# Patient Record
Sex: Female | Born: 1935 | Race: White | Hispanic: No | Smoking: Former smoker
Health system: Southern US, Community
[De-identification: ages and names within clinical notes are randomized; demographics above are authoritative.]

## PROBLEM LIST (undated history)

## (undated) DIAGNOSIS — E119 Type 2 diabetes mellitus without complications: Secondary | ICD-10-CM

## (undated) DIAGNOSIS — I639 Cerebral infarction, unspecified: Secondary | ICD-10-CM

## (undated) DIAGNOSIS — K922 Gastrointestinal hemorrhage, unspecified: Secondary | ICD-10-CM

## (undated) DIAGNOSIS — I10 Essential (primary) hypertension: Secondary | ICD-10-CM

## (undated) DIAGNOSIS — I6523 Occlusion and stenosis of bilateral carotid arteries: Secondary | ICD-10-CM

## (undated) HISTORY — DX: Occlusion and stenosis of bilateral carotid arteries: I65.23

## (undated) HISTORY — DX: Cerebral infarction, unspecified: I63.9

## (undated) HISTORY — DX: Gastrointestinal hemorrhage, unspecified: K92.2

---

## 1998-06-01 ENCOUNTER — Encounter: Admission: RE | Admit: 1998-06-01 | Discharge: 1998-06-01 | Payer: Self-pay | Admitting: Family Medicine

## 1998-06-14 ENCOUNTER — Encounter: Admission: RE | Admit: 1998-06-14 | Discharge: 1998-06-14 | Payer: Self-pay | Admitting: Family Medicine

## 1998-06-30 ENCOUNTER — Encounter: Admission: RE | Admit: 1998-06-30 | Discharge: 1998-06-30 | Payer: Self-pay | Admitting: Family Medicine

## 1998-07-04 ENCOUNTER — Encounter: Admission: RE | Admit: 1998-07-04 | Discharge: 1998-07-04 | Payer: Self-pay | Admitting: Family Medicine

## 1998-07-04 ENCOUNTER — Inpatient Hospital Stay (HOSPITAL_COMMUNITY): Admission: RE | Admit: 1998-07-04 | Discharge: 1998-07-06 | Payer: Self-pay | Admitting: Family Medicine

## 1998-07-10 ENCOUNTER — Inpatient Hospital Stay (HOSPITAL_COMMUNITY): Admission: AD | Admit: 1998-07-10 | Discharge: 1998-07-12 | Payer: Self-pay | Admitting: Family Medicine

## 1998-07-13 ENCOUNTER — Encounter: Admission: RE | Admit: 1998-07-13 | Discharge: 1998-07-13 | Payer: Self-pay | Admitting: Family Medicine

## 1998-07-15 ENCOUNTER — Encounter: Admission: RE | Admit: 1998-07-15 | Discharge: 1998-10-13 | Payer: Self-pay | Admitting: Family Medicine

## 1998-07-19 ENCOUNTER — Encounter: Admission: RE | Admit: 1998-07-19 | Discharge: 1998-07-19 | Payer: Self-pay | Admitting: Family Medicine

## 1998-07-26 ENCOUNTER — Encounter: Admission: RE | Admit: 1998-07-26 | Discharge: 1998-07-26 | Payer: Self-pay | Admitting: Sports Medicine

## 1998-08-09 ENCOUNTER — Encounter: Admission: RE | Admit: 1998-08-09 | Discharge: 1998-08-09 | Payer: Self-pay | Admitting: Family Medicine

## 1998-08-24 ENCOUNTER — Encounter: Admission: RE | Admit: 1998-08-24 | Discharge: 1998-08-24 | Payer: Self-pay | Admitting: Family Medicine

## 1998-09-28 ENCOUNTER — Encounter: Admission: RE | Admit: 1998-09-28 | Discharge: 1998-09-28 | Payer: Self-pay | Admitting: Family Medicine

## 1998-10-11 ENCOUNTER — Encounter: Admission: RE | Admit: 1998-10-11 | Discharge: 1998-10-11 | Payer: Self-pay | Admitting: Family Medicine

## 1998-10-19 ENCOUNTER — Encounter: Admission: RE | Admit: 1998-10-19 | Discharge: 1998-10-19 | Payer: Self-pay | Admitting: Family Medicine

## 1998-10-31 ENCOUNTER — Encounter: Admission: RE | Admit: 1998-10-31 | Discharge: 1998-10-31 | Payer: Self-pay | Admitting: Family Medicine

## 1998-11-01 ENCOUNTER — Ambulatory Visit (HOSPITAL_COMMUNITY): Admission: RE | Admit: 1998-11-01 | Discharge: 1998-11-01 | Payer: Self-pay | Admitting: Family Medicine

## 1998-11-01 ENCOUNTER — Encounter: Payer: Self-pay | Admitting: Family Medicine

## 1998-11-02 ENCOUNTER — Encounter: Admission: RE | Admit: 1998-11-02 | Discharge: 1998-11-02 | Payer: Self-pay | Admitting: Family Medicine

## 1998-11-09 ENCOUNTER — Ambulatory Visit (HOSPITAL_COMMUNITY): Admission: RE | Admit: 1998-11-09 | Discharge: 1998-11-09 | Payer: Self-pay | Admitting: Family Medicine

## 1998-11-09 ENCOUNTER — Encounter: Admission: RE | Admit: 1998-11-09 | Discharge: 1998-11-09 | Payer: Self-pay | Admitting: Family Medicine

## 1998-11-23 ENCOUNTER — Encounter: Admission: RE | Admit: 1998-11-23 | Discharge: 1998-11-23 | Payer: Self-pay | Admitting: Family Medicine

## 1998-11-29 ENCOUNTER — Encounter: Admission: RE | Admit: 1998-11-29 | Discharge: 1998-11-29 | Payer: Self-pay | Admitting: Sports Medicine

## 1998-11-30 ENCOUNTER — Encounter: Admission: RE | Admit: 1998-11-30 | Discharge: 1998-11-30 | Payer: Self-pay | Admitting: Sports Medicine

## 1998-12-20 ENCOUNTER — Encounter: Admission: RE | Admit: 1998-12-20 | Discharge: 1998-12-20 | Payer: Self-pay | Admitting: Sports Medicine

## 1999-02-28 ENCOUNTER — Encounter: Admission: RE | Admit: 1999-02-28 | Discharge: 1999-02-28 | Payer: Self-pay | Admitting: Family Medicine

## 1999-03-11 HISTORY — PX: CORONARY ARTERY BYPASS GRAFT: SHX141

## 1999-03-14 ENCOUNTER — Inpatient Hospital Stay (HOSPITAL_COMMUNITY): Admission: AD | Admit: 1999-03-14 | Discharge: 1999-03-17 | Payer: Self-pay | Admitting: Sports Medicine

## 1999-03-14 ENCOUNTER — Encounter: Admission: RE | Admit: 1999-03-14 | Discharge: 1999-03-14 | Payer: Self-pay | Admitting: Family Medicine

## 1999-03-14 ENCOUNTER — Encounter: Payer: Self-pay | Admitting: Sports Medicine

## 1999-03-16 ENCOUNTER — Encounter: Payer: Self-pay | Admitting: Sports Medicine

## 1999-03-21 ENCOUNTER — Inpatient Hospital Stay (HOSPITAL_COMMUNITY): Admission: AD | Admit: 1999-03-21 | Discharge: 1999-03-31 | Payer: Self-pay | Admitting: Cardiology

## 1999-03-21 ENCOUNTER — Encounter: Payer: Self-pay | Admitting: Thoracic Surgery (Cardiothoracic Vascular Surgery)

## 1999-03-23 ENCOUNTER — Encounter: Payer: Self-pay | Admitting: Thoracic Surgery (Cardiothoracic Vascular Surgery)

## 1999-03-24 ENCOUNTER — Encounter: Payer: Self-pay | Admitting: Thoracic Surgery (Cardiothoracic Vascular Surgery)

## 1999-03-25 ENCOUNTER — Encounter: Payer: Self-pay | Admitting: Thoracic Surgery (Cardiothoracic Vascular Surgery)

## 1999-03-26 ENCOUNTER — Encounter: Payer: Self-pay | Admitting: Thoracic Surgery (Cardiothoracic Vascular Surgery)

## 1999-03-27 ENCOUNTER — Encounter: Payer: Self-pay | Admitting: Thoracic Surgery (Cardiothoracic Vascular Surgery)

## 1999-04-13 ENCOUNTER — Encounter: Payer: Self-pay | Admitting: Cardiology

## 1999-04-13 ENCOUNTER — Inpatient Hospital Stay (HOSPITAL_COMMUNITY): Admission: EM | Admit: 1999-04-13 | Discharge: 1999-04-14 | Payer: Self-pay | Admitting: Emergency Medicine

## 1999-04-18 ENCOUNTER — Encounter: Admission: RE | Admit: 1999-04-18 | Discharge: 1999-04-18 | Payer: Self-pay | Admitting: Family Medicine

## 1999-04-26 ENCOUNTER — Encounter: Admission: RE | Admit: 1999-04-26 | Discharge: 1999-04-26 | Payer: Self-pay | Admitting: Family Medicine

## 1999-05-03 ENCOUNTER — Encounter: Admission: RE | Admit: 1999-05-03 | Discharge: 1999-05-03 | Payer: Self-pay | Admitting: Family Medicine

## 1999-06-07 ENCOUNTER — Encounter: Admission: RE | Admit: 1999-06-07 | Discharge: 1999-06-07 | Payer: Self-pay | Admitting: Family Medicine

## 1999-08-04 ENCOUNTER — Encounter: Admission: RE | Admit: 1999-08-04 | Discharge: 1999-08-04 | Payer: Self-pay | Admitting: Family Medicine

## 1999-08-16 ENCOUNTER — Encounter: Admission: RE | Admit: 1999-08-16 | Discharge: 1999-08-16 | Payer: Self-pay | Admitting: Family Medicine

## 1999-09-12 ENCOUNTER — Encounter: Admission: RE | Admit: 1999-09-12 | Discharge: 1999-09-12 | Payer: Self-pay | Admitting: Family Medicine

## 1999-09-26 ENCOUNTER — Encounter: Admission: RE | Admit: 1999-09-26 | Discharge: 1999-09-26 | Payer: Self-pay | Admitting: Family Medicine

## 1999-10-17 ENCOUNTER — Encounter: Admission: RE | Admit: 1999-10-17 | Discharge: 1999-10-17 | Payer: Self-pay | Admitting: Sports Medicine

## 2000-01-16 ENCOUNTER — Encounter: Admission: RE | Admit: 2000-01-16 | Discharge: 2000-01-16 | Payer: Self-pay | Admitting: Family Medicine

## 2000-04-03 ENCOUNTER — Encounter: Admission: RE | Admit: 2000-04-03 | Discharge: 2000-04-03 | Payer: Self-pay | Admitting: Family Medicine

## 2000-04-17 ENCOUNTER — Encounter: Admission: RE | Admit: 2000-04-17 | Discharge: 2000-04-17 | Payer: Self-pay | Admitting: Sports Medicine

## 2000-04-17 ENCOUNTER — Encounter: Payer: Self-pay | Admitting: Sports Medicine

## 2000-07-30 ENCOUNTER — Encounter: Admission: RE | Admit: 2000-07-30 | Discharge: 2000-07-30 | Payer: Self-pay | Admitting: Sports Medicine

## 2000-08-21 ENCOUNTER — Encounter: Admission: RE | Admit: 2000-08-21 | Discharge: 2000-08-21 | Payer: Self-pay | Admitting: Family Medicine

## 2000-09-09 DIAGNOSIS — K922 Gastrointestinal hemorrhage, unspecified: Secondary | ICD-10-CM

## 2000-09-09 HISTORY — DX: Gastrointestinal hemorrhage, unspecified: K92.2

## 2000-09-19 ENCOUNTER — Inpatient Hospital Stay (HOSPITAL_COMMUNITY): Admission: EM | Admit: 2000-09-19 | Discharge: 2000-09-20 | Payer: Self-pay | Admitting: Emergency Medicine

## 2000-09-19 ENCOUNTER — Encounter: Admission: RE | Admit: 2000-09-19 | Discharge: 2000-09-19 | Payer: Self-pay | Admitting: Family Medicine

## 2000-10-08 ENCOUNTER — Encounter: Admission: RE | Admit: 2000-10-08 | Discharge: 2000-10-08 | Payer: Self-pay | Admitting: Family Medicine

## 2000-11-05 ENCOUNTER — Encounter: Admission: RE | Admit: 2000-11-05 | Discharge: 2000-11-05 | Payer: Self-pay | Admitting: Family Medicine

## 2001-03-12 ENCOUNTER — Encounter: Admission: RE | Admit: 2001-03-12 | Discharge: 2001-03-12 | Payer: Self-pay | Admitting: Family Medicine

## 2001-03-12 ENCOUNTER — Encounter: Payer: Self-pay | Admitting: Family Medicine

## 2001-03-12 ENCOUNTER — Inpatient Hospital Stay (HOSPITAL_COMMUNITY): Admission: AD | Admit: 2001-03-12 | Discharge: 2001-03-16 | Payer: Self-pay | Admitting: Family Medicine

## 2001-03-15 ENCOUNTER — Encounter: Payer: Self-pay | Admitting: Family Medicine

## 2001-03-17 ENCOUNTER — Encounter: Admission: RE | Admit: 2001-03-17 | Discharge: 2001-03-17 | Payer: Self-pay | Admitting: Family Medicine

## 2001-03-20 ENCOUNTER — Encounter: Admission: RE | Admit: 2001-03-20 | Discharge: 2001-03-20 | Payer: Self-pay | Admitting: Family Medicine

## 2001-03-21 ENCOUNTER — Encounter: Admission: RE | Admit: 2001-03-21 | Discharge: 2001-03-21 | Payer: Self-pay | Admitting: Family Medicine

## 2001-03-25 ENCOUNTER — Encounter: Admission: RE | Admit: 2001-03-25 | Discharge: 2001-03-25 | Payer: Self-pay | Admitting: Family Medicine

## 2001-04-09 ENCOUNTER — Encounter: Admission: RE | Admit: 2001-04-09 | Discharge: 2001-04-09 | Payer: Self-pay | Admitting: Family Medicine

## 2001-05-21 ENCOUNTER — Encounter: Admission: RE | Admit: 2001-05-21 | Discharge: 2001-05-21 | Payer: Self-pay | Admitting: Family Medicine

## 2001-06-18 ENCOUNTER — Encounter: Admission: RE | Admit: 2001-06-18 | Discharge: 2001-06-18 | Payer: Self-pay | Admitting: Sports Medicine

## 2001-07-18 ENCOUNTER — Encounter: Admission: RE | Admit: 2001-07-18 | Discharge: 2001-07-18 | Payer: Self-pay | Admitting: Family Medicine

## 2001-08-26 ENCOUNTER — Encounter: Payer: Self-pay | Admitting: Emergency Medicine

## 2001-08-26 ENCOUNTER — Emergency Department (HOSPITAL_COMMUNITY): Admission: EM | Admit: 2001-08-26 | Discharge: 2001-08-26 | Payer: Self-pay | Admitting: Emergency Medicine

## 2001-08-29 ENCOUNTER — Encounter: Admission: RE | Admit: 2001-08-29 | Discharge: 2001-08-29 | Payer: Self-pay | Admitting: Family Medicine

## 2001-09-19 ENCOUNTER — Encounter: Payer: Self-pay | Admitting: Family Medicine

## 2001-09-19 ENCOUNTER — Encounter: Admission: RE | Admit: 2001-09-19 | Discharge: 2001-09-19 | Payer: Self-pay | Admitting: Family Medicine

## 2001-11-04 ENCOUNTER — Encounter: Admission: RE | Admit: 2001-11-04 | Discharge: 2001-11-04 | Payer: Self-pay | Admitting: Family Medicine

## 2001-11-18 ENCOUNTER — Encounter: Admission: RE | Admit: 2001-11-18 | Discharge: 2001-11-18 | Payer: Self-pay | Admitting: Family Medicine

## 2001-12-10 ENCOUNTER — Encounter (INDEPENDENT_AMBULATORY_CARE_PROVIDER_SITE_OTHER): Payer: Self-pay | Admitting: *Deleted

## 2001-12-10 LAB — CONVERTED CEMR LAB

## 2001-12-12 ENCOUNTER — Encounter: Admission: RE | Admit: 2001-12-12 | Discharge: 2001-12-12 | Payer: Self-pay | Admitting: Family Medicine

## 2001-12-23 ENCOUNTER — Encounter: Admission: RE | Admit: 2001-12-23 | Discharge: 2001-12-23 | Payer: Self-pay | Admitting: Family Medicine

## 2002-01-06 ENCOUNTER — Encounter: Admission: RE | Admit: 2002-01-06 | Discharge: 2002-01-06 | Payer: Self-pay | Admitting: Family Medicine

## 2002-01-12 ENCOUNTER — Encounter: Payer: Self-pay | Admitting: Family Medicine

## 2002-01-12 ENCOUNTER — Encounter: Admission: RE | Admit: 2002-01-12 | Discharge: 2002-01-12 | Payer: Self-pay | Admitting: Family Medicine

## 2002-02-03 ENCOUNTER — Encounter: Admission: RE | Admit: 2002-02-03 | Discharge: 2002-02-03 | Payer: Self-pay | Admitting: Family Medicine

## 2002-03-13 ENCOUNTER — Encounter: Admission: RE | Admit: 2002-03-13 | Discharge: 2002-03-13 | Payer: Self-pay | Admitting: Family Medicine

## 2002-04-14 ENCOUNTER — Encounter: Admission: RE | Admit: 2002-04-14 | Discharge: 2002-04-14 | Payer: Self-pay | Admitting: Family Medicine

## 2002-05-12 ENCOUNTER — Encounter: Admission: RE | Admit: 2002-05-12 | Discharge: 2002-05-12 | Payer: Self-pay | Admitting: Family Medicine

## 2002-06-15 ENCOUNTER — Encounter: Admission: RE | Admit: 2002-06-15 | Discharge: 2002-06-15 | Payer: Self-pay | Admitting: Family Medicine

## 2002-07-10 DIAGNOSIS — I639 Cerebral infarction, unspecified: Secondary | ICD-10-CM

## 2002-07-10 HISTORY — PX: PANRETINAL PHOTOCOAGULATION: SHX2160

## 2002-07-10 HISTORY — DX: Cerebral infarction, unspecified: I63.9

## 2002-07-14 ENCOUNTER — Encounter: Admission: RE | Admit: 2002-07-14 | Discharge: 2002-07-14 | Payer: Self-pay | Admitting: Family Medicine

## 2002-08-06 ENCOUNTER — Emergency Department (HOSPITAL_COMMUNITY): Admission: EM | Admit: 2002-08-06 | Discharge: 2002-08-06 | Payer: Self-pay | Admitting: Emergency Medicine

## 2002-08-06 ENCOUNTER — Encounter: Payer: Self-pay | Admitting: Emergency Medicine

## 2002-08-07 ENCOUNTER — Ambulatory Visit: Admission: RE | Admit: 2002-08-07 | Discharge: 2002-08-07 | Payer: Self-pay | Admitting: Family Medicine

## 2002-08-11 ENCOUNTER — Encounter: Admission: RE | Admit: 2002-08-11 | Discharge: 2002-08-11 | Payer: Self-pay | Admitting: Family Medicine

## 2002-08-25 ENCOUNTER — Encounter: Admission: RE | Admit: 2002-08-25 | Discharge: 2002-08-25 | Payer: Self-pay | Admitting: Family Medicine

## 2002-09-22 ENCOUNTER — Encounter: Admission: RE | Admit: 2002-09-22 | Discharge: 2002-09-22 | Payer: Self-pay | Admitting: Family Medicine

## 2002-10-13 ENCOUNTER — Encounter: Admission: RE | Admit: 2002-10-13 | Discharge: 2002-10-13 | Payer: Self-pay | Admitting: Family Medicine

## 2003-02-09 ENCOUNTER — Encounter: Admission: RE | Admit: 2003-02-09 | Discharge: 2003-02-09 | Payer: Self-pay | Admitting: Family Medicine

## 2003-03-23 ENCOUNTER — Encounter: Admission: RE | Admit: 2003-03-23 | Discharge: 2003-03-23 | Payer: Self-pay | Admitting: Family Medicine

## 2003-05-25 ENCOUNTER — Encounter: Admission: RE | Admit: 2003-05-25 | Discharge: 2003-05-25 | Payer: Self-pay | Admitting: Family Medicine

## 2003-06-18 ENCOUNTER — Encounter: Admission: RE | Admit: 2003-06-18 | Discharge: 2003-06-18 | Payer: Self-pay | Admitting: Family Medicine

## 2003-07-06 ENCOUNTER — Encounter: Admission: RE | Admit: 2003-07-06 | Discharge: 2003-07-06 | Payer: Self-pay | Admitting: Family Medicine

## 2003-08-24 ENCOUNTER — Encounter: Admission: RE | Admit: 2003-08-24 | Discharge: 2003-08-24 | Payer: Self-pay | Admitting: Sports Medicine

## 2003-09-08 ENCOUNTER — Encounter: Admission: RE | Admit: 2003-09-08 | Discharge: 2003-09-08 | Payer: Self-pay | Admitting: Family Medicine

## 2003-09-22 ENCOUNTER — Encounter: Admission: RE | Admit: 2003-09-22 | Discharge: 2003-09-22 | Payer: Self-pay | Admitting: Family Medicine

## 2003-10-06 ENCOUNTER — Encounter: Admission: RE | Admit: 2003-10-06 | Discharge: 2003-10-06 | Payer: Self-pay | Admitting: Family Medicine

## 2003-10-20 ENCOUNTER — Encounter: Admission: RE | Admit: 2003-10-20 | Discharge: 2003-11-09 | Payer: Self-pay | Admitting: Family Medicine

## 2003-11-03 ENCOUNTER — Encounter: Admission: RE | Admit: 2003-11-03 | Discharge: 2003-11-03 | Payer: Self-pay | Admitting: Family Medicine

## 2003-11-10 ENCOUNTER — Encounter: Admission: RE | Admit: 2003-11-10 | Discharge: 2004-02-08 | Payer: Self-pay | Admitting: Family Medicine

## 2003-11-17 ENCOUNTER — Encounter: Admission: RE | Admit: 2003-11-17 | Discharge: 2003-11-17 | Payer: Self-pay | Admitting: Family Medicine

## 2004-01-06 ENCOUNTER — Encounter: Admission: RE | Admit: 2004-01-06 | Discharge: 2004-01-06 | Payer: Self-pay | Admitting: Family Medicine

## 2004-02-15 ENCOUNTER — Encounter: Admission: RE | Admit: 2004-02-15 | Discharge: 2004-02-15 | Payer: Self-pay | Admitting: Family Medicine

## 2004-03-10 HISTORY — PX: PANRETINAL PHOTOCOAGULATION: SHX2160

## 2004-03-24 ENCOUNTER — Encounter: Admission: RE | Admit: 2004-03-24 | Discharge: 2004-03-24 | Payer: Self-pay | Admitting: Sports Medicine

## 2004-04-18 ENCOUNTER — Encounter: Admission: RE | Admit: 2004-04-18 | Discharge: 2004-04-18 | Payer: Self-pay | Admitting: Family Medicine

## 2004-06-27 ENCOUNTER — Encounter: Admission: RE | Admit: 2004-06-27 | Discharge: 2004-06-27 | Payer: Self-pay | Admitting: Family Medicine

## 2004-07-25 ENCOUNTER — Encounter: Admission: RE | Admit: 2004-07-25 | Discharge: 2004-07-25 | Payer: Self-pay | Admitting: Family Medicine

## 2004-09-20 ENCOUNTER — Ambulatory Visit: Payer: Self-pay | Admitting: Family Medicine

## 2004-09-28 ENCOUNTER — Ambulatory Visit: Payer: Self-pay | Admitting: Family Medicine

## 2004-10-18 ENCOUNTER — Ambulatory Visit: Payer: Self-pay | Admitting: Family Medicine

## 2004-11-15 ENCOUNTER — Ambulatory Visit: Payer: Self-pay | Admitting: Family Medicine

## 2004-12-07 ENCOUNTER — Ambulatory Visit (HOSPITAL_COMMUNITY): Admission: RE | Admit: 2004-12-07 | Discharge: 2004-12-07 | Payer: Self-pay | Admitting: Family Medicine

## 2004-12-13 ENCOUNTER — Ambulatory Visit: Payer: Self-pay | Admitting: Family Medicine

## 2005-01-17 ENCOUNTER — Ambulatory Visit: Payer: Self-pay | Admitting: Family Medicine

## 2005-03-21 ENCOUNTER — Ambulatory Visit: Payer: Self-pay | Admitting: Family Medicine

## 2005-07-03 ENCOUNTER — Ambulatory Visit: Payer: Self-pay | Admitting: Family Medicine

## 2005-08-07 ENCOUNTER — Ambulatory Visit: Payer: Self-pay | Admitting: Family Medicine

## 2005-08-07 DIAGNOSIS — I6523 Occlusion and stenosis of bilateral carotid arteries: Secondary | ICD-10-CM

## 2005-08-07 HISTORY — DX: Occlusion and stenosis of bilateral carotid arteries: I65.23

## 2005-09-18 ENCOUNTER — Ambulatory Visit: Payer: Self-pay | Admitting: Family Medicine

## 2005-09-18 ENCOUNTER — Ambulatory Visit (HOSPITAL_COMMUNITY): Admission: RE | Admit: 2005-09-18 | Discharge: 2005-09-18 | Payer: Self-pay | Admitting: Family Medicine

## 2005-09-24 ENCOUNTER — Ambulatory Visit (HOSPITAL_COMMUNITY): Admission: RE | Admit: 2005-09-24 | Discharge: 2005-09-24 | Payer: Self-pay | Admitting: Family Medicine

## 2005-10-23 ENCOUNTER — Ambulatory Visit: Payer: Self-pay | Admitting: Family Medicine

## 2005-11-08 ENCOUNTER — Ambulatory Visit: Payer: Self-pay | Admitting: Sports Medicine

## 2005-11-29 ENCOUNTER — Emergency Department (HOSPITAL_COMMUNITY): Admission: EM | Admit: 2005-11-29 | Discharge: 2005-11-29 | Payer: Self-pay | Admitting: Emergency Medicine

## 2006-01-08 ENCOUNTER — Ambulatory Visit: Payer: Self-pay | Admitting: Family Medicine

## 2006-02-12 ENCOUNTER — Ambulatory Visit: Payer: Self-pay | Admitting: Family Medicine

## 2006-02-12 ENCOUNTER — Encounter: Payer: Self-pay | Admitting: Family Medicine

## 2006-03-19 ENCOUNTER — Ambulatory Visit: Payer: Self-pay | Admitting: Family Medicine

## 2006-06-11 ENCOUNTER — Ambulatory Visit: Payer: Self-pay | Admitting: Family Medicine

## 2006-07-09 ENCOUNTER — Ambulatory Visit: Payer: Self-pay | Admitting: Family Medicine

## 2006-07-16 ENCOUNTER — Ambulatory Visit: Payer: Self-pay | Admitting: Family Medicine

## 2006-07-25 ENCOUNTER — Ambulatory Visit (HOSPITAL_COMMUNITY): Admission: RE | Admit: 2006-07-25 | Discharge: 2006-07-25 | Payer: Self-pay | Admitting: Ophthalmology

## 2006-07-30 ENCOUNTER — Ambulatory Visit: Payer: Self-pay | Admitting: Sports Medicine

## 2006-07-30 HISTORY — PX: CATARACT EXTRACTION, BILATERAL: SHX1313

## 2006-09-05 ENCOUNTER — Ambulatory Visit (HOSPITAL_COMMUNITY): Admission: RE | Admit: 2006-09-05 | Discharge: 2006-09-05 | Payer: Self-pay | Admitting: Ophthalmology

## 2006-09-17 ENCOUNTER — Ambulatory Visit: Payer: Self-pay | Admitting: Family Medicine

## 2006-11-19 ENCOUNTER — Ambulatory Visit: Payer: Self-pay | Admitting: Family Medicine

## 2006-12-06 ENCOUNTER — Ambulatory Visit: Payer: Self-pay | Admitting: Family Medicine

## 2006-12-17 ENCOUNTER — Encounter: Admission: RE | Admit: 2006-12-17 | Discharge: 2006-12-17 | Payer: Self-pay | Admitting: Sports Medicine

## 2006-12-24 ENCOUNTER — Ambulatory Visit: Payer: Self-pay | Admitting: Family Medicine

## 2006-12-31 ENCOUNTER — Ambulatory Visit: Payer: Self-pay | Admitting: Family Medicine

## 2007-02-06 DIAGNOSIS — M959 Acquired deformity of musculoskeletal system, unspecified: Secondary | ICD-10-CM | POA: Insufficient documentation

## 2007-02-06 DIAGNOSIS — I6529 Occlusion and stenosis of unspecified carotid artery: Secondary | ICD-10-CM

## 2007-02-06 DIAGNOSIS — K279 Peptic ulcer, site unspecified, unspecified as acute or chronic, without hemorrhage or perforation: Secondary | ICD-10-CM | POA: Insufficient documentation

## 2007-02-06 DIAGNOSIS — E785 Hyperlipidemia, unspecified: Secondary | ICD-10-CM

## 2007-02-06 DIAGNOSIS — I2510 Atherosclerotic heart disease of native coronary artery without angina pectoris: Secondary | ICD-10-CM

## 2007-02-06 DIAGNOSIS — E119 Type 2 diabetes mellitus without complications: Secondary | ICD-10-CM

## 2007-02-06 DIAGNOSIS — N393 Stress incontinence (female) (male): Secondary | ICD-10-CM

## 2007-02-06 DIAGNOSIS — M810 Age-related osteoporosis without current pathological fracture: Secondary | ICD-10-CM | POA: Insufficient documentation

## 2007-02-06 DIAGNOSIS — Z794 Long term (current) use of insulin: Secondary | ICD-10-CM

## 2007-02-06 DIAGNOSIS — I10 Essential (primary) hypertension: Secondary | ICD-10-CM

## 2007-02-06 DIAGNOSIS — F17200 Nicotine dependence, unspecified, uncomplicated: Secondary | ICD-10-CM

## 2007-02-07 ENCOUNTER — Encounter (INDEPENDENT_AMBULATORY_CARE_PROVIDER_SITE_OTHER): Payer: Self-pay | Admitting: *Deleted

## 2007-02-25 ENCOUNTER — Ambulatory Visit: Payer: Self-pay | Admitting: Family Medicine

## 2007-02-25 DIAGNOSIS — H5450 Low vision, one eye, unspecified eye: Secondary | ICD-10-CM | POA: Insufficient documentation

## 2007-02-25 LAB — CONVERTED CEMR LAB
Cholesterol, target level: 200 mg/dL
HDL goal, serum: 40 mg/dL
LDL Cholesterol: 102 mg/dL — ABNORMAL HIGH (ref 0–99)
Triglycerides: 264 mg/dL — ABNORMAL HIGH (ref <150)
VLDL: 53 mg/dL — ABNORMAL HIGH (ref 0–40)

## 2007-02-26 ENCOUNTER — Telehealth: Payer: Self-pay | Admitting: Family Medicine

## 2007-03-23 ENCOUNTER — Encounter: Payer: Self-pay | Admitting: Family Medicine

## 2007-03-25 ENCOUNTER — Ambulatory Visit: Payer: Self-pay | Admitting: Family Medicine

## 2007-03-25 LAB — CONVERTED CEMR LAB
AST: 18 units/L (ref 0–37)
Alkaline Phosphatase: 116 units/L (ref 39–117)
Calcium: 9.9 mg/dL (ref 8.4–10.5)
Chloride: 101 meq/L (ref 96–112)
Creatinine, Ser: 0.92 mg/dL (ref 0.40–1.20)
Direct LDL: 90 mg/dL
Sodium: 138 meq/L (ref 135–145)

## 2007-03-31 ENCOUNTER — Telehealth: Payer: Self-pay | Admitting: Family Medicine

## 2007-04-01 ENCOUNTER — Telehealth: Payer: Self-pay | Admitting: *Deleted

## 2007-04-02 ENCOUNTER — Encounter: Payer: Self-pay | Admitting: Vascular Surgery

## 2007-04-02 ENCOUNTER — Ambulatory Visit (HOSPITAL_COMMUNITY): Admission: RE | Admit: 2007-04-02 | Discharge: 2007-04-02 | Payer: Self-pay | Admitting: Family Medicine

## 2007-04-02 ENCOUNTER — Ambulatory Visit: Payer: Self-pay | Admitting: Vascular Surgery

## 2007-04-03 ENCOUNTER — Telehealth: Payer: Self-pay | Admitting: Family Medicine

## 2007-04-03 DIAGNOSIS — I7389 Other specified peripheral vascular diseases: Secondary | ICD-10-CM

## 2007-04-29 ENCOUNTER — Telehealth: Payer: Self-pay | Admitting: *Deleted

## 2007-05-27 ENCOUNTER — Ambulatory Visit: Payer: Self-pay | Admitting: Family Medicine

## 2007-05-27 DIAGNOSIS — K3184 Gastroparesis: Secondary | ICD-10-CM | POA: Insufficient documentation

## 2007-06-24 ENCOUNTER — Ambulatory Visit: Payer: Self-pay | Admitting: Family Medicine

## 2007-06-24 LAB — CONVERTED CEMR LAB: Hgb A1c MFr Bld: 7.6 %

## 2007-08-26 ENCOUNTER — Ambulatory Visit: Payer: Self-pay | Admitting: Family Medicine

## 2007-09-25 ENCOUNTER — Ambulatory Visit: Payer: Self-pay | Admitting: Family Medicine

## 2007-09-29 ENCOUNTER — Encounter: Payer: Self-pay | Admitting: Family Medicine

## 2007-09-30 ENCOUNTER — Ambulatory Visit: Payer: Self-pay | Admitting: Family Medicine

## 2007-09-30 LAB — CONVERTED CEMR LAB: Hgb A1c MFr Bld: 7.2 %

## 2007-10-06 LAB — CONVERTED CEMR LAB
BUN: 14 mg/dL (ref 6–23)
CO2: 26 meq/L (ref 19–32)
Calcium: 9.6 mg/dL (ref 8.4–10.5)
Direct LDL: 84 mg/dL
Potassium: 4.6 meq/L (ref 3.5–5.3)
Sodium: 143 meq/L (ref 135–145)

## 2007-10-10 ENCOUNTER — Encounter: Payer: Self-pay | Admitting: Family Medicine

## 2007-10-16 ENCOUNTER — Encounter: Payer: Self-pay | Admitting: Family Medicine

## 2007-11-25 ENCOUNTER — Telehealth: Payer: Self-pay | Admitting: *Deleted

## 2007-11-25 ENCOUNTER — Ambulatory Visit: Payer: Self-pay | Admitting: Family Medicine

## 2007-12-23 ENCOUNTER — Encounter: Payer: Self-pay | Admitting: *Deleted

## 2008-02-24 ENCOUNTER — Ambulatory Visit: Payer: Self-pay | Admitting: Family Medicine

## 2008-02-24 DIAGNOSIS — L608 Other nail disorders: Secondary | ICD-10-CM | POA: Insufficient documentation

## 2008-02-24 LAB — CONVERTED CEMR LAB
BUN: 19 mg/dL (ref 6–23)
Chloride: 104 meq/L (ref 96–112)
Glucose, Bld: 91 mg/dL (ref 70–99)
HDL: 43 mg/dL (ref >39)
Hemoglobin: 13.1 g/dL (ref 12.0–15.0)
Potassium: 4.9 meq/L (ref 3.5–5.3)
Total CHOL/HDL Ratio: 3.3
Triglycerides: 189 mg/dL — ABNORMAL HIGH (ref <150)
VLDL: 38 mg/dL (ref 0–40)

## 2008-02-25 ENCOUNTER — Encounter: Payer: Self-pay | Admitting: Family Medicine

## 2008-05-11 ENCOUNTER — Telehealth: Payer: Self-pay | Admitting: *Deleted

## 2008-05-11 ENCOUNTER — Ambulatory Visit: Payer: Self-pay | Admitting: Family Medicine

## 2008-05-11 LAB — CONVERTED CEMR LAB: Hgb A1c MFr Bld: 6.8 %

## 2008-05-12 ENCOUNTER — Telehealth: Payer: Self-pay | Admitting: Family Medicine

## 2008-05-13 ENCOUNTER — Encounter: Payer: Self-pay | Admitting: Family Medicine

## 2008-05-14 ENCOUNTER — Encounter: Admission: RE | Admit: 2008-05-14 | Discharge: 2008-05-14 | Payer: Self-pay | Admitting: Family Medicine

## 2008-06-01 ENCOUNTER — Ambulatory Visit: Payer: Self-pay | Admitting: Family Medicine

## 2008-06-08 ENCOUNTER — Telehealth: Payer: Self-pay | Admitting: *Deleted

## 2008-06-09 ENCOUNTER — Ambulatory Visit (HOSPITAL_COMMUNITY): Admission: RE | Admit: 2008-06-09 | Discharge: 2008-06-09 | Payer: Self-pay | Admitting: Family Medicine

## 2008-08-09 ENCOUNTER — Encounter: Payer: Self-pay | Admitting: Family Medicine

## 2008-08-10 ENCOUNTER — Ambulatory Visit: Payer: Self-pay | Admitting: Family Medicine

## 2008-08-10 LAB — CONVERTED CEMR LAB
BUN: 19 mg/dL (ref 6–23)
Chloride: 101 meq/L (ref 96–112)
HCT: 41.8 % (ref 36.0–46.0)
Hemoglobin: 13.4 g/dL (ref 12.0–15.0)
Potassium: 4.4 meq/L (ref 3.5–5.3)
TSH: 2.672 microintl units/mL (ref 0.350–4.50)
WBC: 12.7 10*3/uL — ABNORMAL HIGH (ref 4.0–10.5)

## 2008-08-12 ENCOUNTER — Encounter: Payer: Self-pay | Admitting: Family Medicine

## 2008-08-18 ENCOUNTER — Encounter: Payer: Self-pay | Admitting: Family Medicine

## 2008-09-01 ENCOUNTER — Encounter: Payer: Self-pay | Admitting: Family Medicine

## 2008-09-28 ENCOUNTER — Telehealth: Payer: Self-pay | Admitting: *Deleted

## 2008-10-05 ENCOUNTER — Ambulatory Visit: Payer: Self-pay | Admitting: Family Medicine

## 2008-12-28 ENCOUNTER — Ambulatory Visit: Payer: Self-pay | Admitting: Family Medicine

## 2008-12-28 LAB — CONVERTED CEMR LAB
Albumin: 4.3 g/dL (ref 3.5–5.2)
Alkaline Phosphatase: 98 units/L (ref 39–117)
BUN: 21 mg/dL (ref 6–23)
CO2: 24 meq/L (ref 19–32)
Calcium: 9.7 mg/dL (ref 8.4–10.5)
Glucose, Bld: 108 mg/dL — ABNORMAL HIGH (ref 70–99)
HCT: 41.1 % (ref 36.0–46.0)
Hgb A1c MFr Bld: 7.1 %
MCHC: 31.1 g/dL (ref 30.0–36.0)
Potassium: 4.4 meq/L (ref 3.5–5.3)
Sodium: 140 meq/L (ref 135–145)
Total Bilirubin: 0.3 mg/dL (ref 0.3–1.2)
Total Protein: 7.4 g/dL (ref 6.0–8.3)
WBC: 12 10*3/uL — ABNORMAL HIGH (ref 4.0–10.5)

## 2008-12-31 ENCOUNTER — Encounter: Admission: RE | Admit: 2008-12-31 | Discharge: 2008-12-31 | Payer: Self-pay | Admitting: Family Medicine

## 2009-01-03 ENCOUNTER — Telehealth: Payer: Self-pay | Admitting: Family Medicine

## 2009-02-06 ENCOUNTER — Encounter: Payer: Self-pay | Admitting: Family Medicine

## 2009-02-10 ENCOUNTER — Ambulatory Visit: Payer: Self-pay | Admitting: Family Medicine

## 2009-03-10 ENCOUNTER — Ambulatory Visit: Payer: Self-pay | Admitting: Family Medicine

## 2009-03-10 LAB — CONVERTED CEMR LAB: VLDL: 25 mg/dL (ref 0–40)

## 2009-03-24 ENCOUNTER — Ambulatory Visit: Payer: Self-pay | Admitting: Family Medicine

## 2009-04-15 ENCOUNTER — Ambulatory Visit (HOSPITAL_COMMUNITY): Admission: RE | Admit: 2009-04-15 | Discharge: 2009-04-15 | Payer: Self-pay | Admitting: Family Medicine

## 2009-06-28 ENCOUNTER — Ambulatory Visit: Payer: Self-pay | Admitting: Family Medicine

## 2009-07-28 ENCOUNTER — Telehealth: Payer: Self-pay | Admitting: Family Medicine

## 2009-09-14 ENCOUNTER — Telehealth: Payer: Self-pay | Admitting: Family Medicine

## 2009-09-21 ENCOUNTER — Encounter: Payer: Self-pay | Admitting: Family Medicine

## 2009-09-29 ENCOUNTER — Encounter (INDEPENDENT_AMBULATORY_CARE_PROVIDER_SITE_OTHER): Payer: Self-pay | Admitting: *Deleted

## 2009-11-08 ENCOUNTER — Ambulatory Visit: Payer: Self-pay | Admitting: Family Medicine

## 2009-11-08 DIAGNOSIS — R5383 Other fatigue: Secondary | ICD-10-CM

## 2009-11-08 DIAGNOSIS — R5381 Other malaise: Secondary | ICD-10-CM

## 2009-11-08 DIAGNOSIS — Z9181 History of falling: Secondary | ICD-10-CM | POA: Insufficient documentation

## 2009-11-09 LAB — CONVERTED CEMR LAB
ALT: 9 units/L (ref 0–35)
AST: 13 units/L (ref 0–37)
BUN: 18 mg/dL (ref 6–23)
CO2: 24 meq/L (ref 19–32)
Calcium: 9.7 mg/dL (ref 8.4–10.5)
Chloride: 102 meq/L (ref 96–112)
Direct LDL: 110 mg/dL — ABNORMAL HIGH
HCT: 35.9 % — ABNORMAL LOW (ref 36.0–46.0)
Hemoglobin: 11.6 g/dL — ABNORMAL LOW (ref 12.0–15.0)
Total Bilirubin: 0.2 mg/dL — ABNORMAL LOW (ref 0.3–1.2)
Vit D, 25-Hydroxy: 22 ng/mL — ABNORMAL LOW (ref 30–89)

## 2009-12-01 ENCOUNTER — Encounter: Payer: Self-pay | Admitting: Family Medicine

## 2010-01-31 ENCOUNTER — Ambulatory Visit: Payer: Self-pay | Admitting: Family Medicine

## 2010-01-31 LAB — CONVERTED CEMR LAB: Hgb A1c MFr Bld: 7.7 %

## 2010-03-20 ENCOUNTER — Telehealth: Payer: Self-pay | Admitting: Family Medicine

## 2010-03-20 DIAGNOSIS — D649 Anemia, unspecified: Secondary | ICD-10-CM | POA: Insufficient documentation

## 2010-03-31 ENCOUNTER — Encounter: Payer: Self-pay | Admitting: Family Medicine

## 2010-04-17 ENCOUNTER — Ambulatory Visit (HOSPITAL_COMMUNITY): Admission: RE | Admit: 2010-04-17 | Discharge: 2010-04-17 | Payer: Self-pay | Admitting: Family Medicine

## 2010-05-30 ENCOUNTER — Ambulatory Visit: Payer: Self-pay | Admitting: Family Medicine

## 2010-06-01 ENCOUNTER — Telehealth: Payer: Self-pay | Admitting: *Deleted

## 2010-06-07 ENCOUNTER — Ambulatory Visit (HOSPITAL_COMMUNITY): Admission: RE | Admit: 2010-06-07 | Discharge: 2010-06-07 | Payer: Self-pay | Admitting: Family Medicine

## 2010-06-07 ENCOUNTER — Ambulatory Visit: Payer: Self-pay | Admitting: Vascular Surgery

## 2010-08-07 ENCOUNTER — Telehealth: Payer: Self-pay | Admitting: Family Medicine

## 2010-08-23 ENCOUNTER — Telehealth: Payer: Self-pay | Admitting: Family Medicine

## 2010-09-07 ENCOUNTER — Encounter: Payer: Self-pay | Admitting: Family Medicine

## 2010-09-11 ENCOUNTER — Telehealth: Payer: Self-pay | Admitting: *Deleted

## 2010-09-11 ENCOUNTER — Encounter: Payer: Self-pay | Admitting: *Deleted

## 2010-10-10 ENCOUNTER — Encounter: Payer: Self-pay | Admitting: Pharmacist

## 2010-10-17 ENCOUNTER — Ambulatory Visit: Payer: Self-pay | Admitting: Family Medicine

## 2010-11-06 ENCOUNTER — Encounter: Payer: Self-pay | Admitting: Family Medicine

## 2010-11-08 ENCOUNTER — Encounter: Payer: Self-pay | Admitting: Family Medicine

## 2010-12-05 DIAGNOSIS — D518 Other vitamin B12 deficiency anemias: Secondary | ICD-10-CM

## 2010-12-05 LAB — CONVERTED CEMR LAB
HCT: 38.9 % (ref 36.0–46.0)
MCV: 83.8 fL (ref 78.0–100.0)
RBC: 4.64 M/uL (ref 3.87–5.11)
Vitamin B-12: 176 pg/mL — ABNORMAL LOW (ref 211–911)
WBC: 9.9 10*3/uL (ref 4.0–10.5)

## 2010-12-12 ENCOUNTER — Ambulatory Visit
Admission: RE | Admit: 2010-12-12 | Discharge: 2010-12-12 | Payer: Self-pay | Source: Home / Self Care | Attending: Family Medicine | Admitting: Family Medicine

## 2010-12-12 ENCOUNTER — Encounter: Payer: Self-pay | Admitting: Family Medicine

## 2010-12-14 ENCOUNTER — Encounter: Payer: Self-pay | Admitting: Family Medicine

## 2010-12-23 LAB — CONVERTED CEMR LAB
ALT: 10 units/L (ref 0–35)
Albumin: 4.4 g/dL (ref 3.5–5.2)
BUN: 19 mg/dL (ref 6–23)
CO2: 26 meq/L (ref 19–32)
Calcium: 9.9 mg/dL (ref 8.4–10.5)
Chloride: 100 meq/L (ref 96–112)
Creatinine, Ser: 0.77 mg/dL (ref 0.40–1.20)
Potassium: 4.6 meq/L (ref 3.5–5.3)
Vitamin B-12: 196 pg/mL — ABNORMAL LOW (ref 211–911)

## 2010-12-26 ENCOUNTER — Telehealth: Payer: Self-pay | Admitting: Family Medicine

## 2010-12-31 ENCOUNTER — Encounter: Payer: Self-pay | Admitting: Family Medicine

## 2011-01-05 ENCOUNTER — Encounter: Payer: Self-pay | Admitting: Family Medicine

## 2011-01-07 LAB — CONVERTED CEMR LAB
BUN: 15 mg/dL (ref 6–23)
CO2: 27 meq/L (ref 19–32)
Calcium: 9.9 mg/dL (ref 8.4–10.5)
Chloride: 102 meq/L (ref 96–112)
Creatinine, Ser: 0.87 mg/dL (ref 0.40–1.20)
HDL: 45 mg/dL (ref >39)
Hemoglobin: 11.8 g/dL — ABNORMAL LOW (ref 12.0–15.0)
MCHC: 30.5 g/dL (ref 30.0–36.0)
MCV: 83.2 fL (ref 78.0–100.0)
Platelets: 361 10*3/uL (ref 150–400)
Potassium: 4.7 meq/L (ref 3.5–5.3)
RBC: 4.65 M/uL (ref 3.87–5.11)
RDW: 15.8 % — ABNORMAL HIGH (ref 11.5–15.5)
Sodium: 140 meq/L (ref 135–145)
Total CHOL/HDL Ratio: 3.5
VLDL: 19 mg/dL (ref 0–40)

## 2011-01-11 NOTE — Miscellaneous (Signed)
Summary: Orders Update  Clinical Lists Changes  Problems: Added new problem of ENCOUNTER FOR LONG-TERM USE OF OTHER MEDICATIONS (ICD-V58.69) Orders: Added new Test order of B12-[facility] [id number]) - Signed Added new Test order of CBC-[facility] (59563) - Signed   Ok per Dr. Sheffield Slider

## 2011-01-11 NOTE — Miscellaneous (Signed)
Summary: Returned form from [employer]  Clinical Lists Changes   Form returned with note that diabetic supplies are being supplied by a different company

## 2011-01-11 NOTE — Progress Notes (Signed)
  Phone Note Call from Patient   Caller: Patient Call For: [phone] Summary of Call: Gwendolyn Wallace' glucometer is not longer working.  Need a new one, but have to have an order from physician.  Please fax to [employer] @ [phone].  Telephone number is [phone] Initial call taken by: Britta Mccreedy mcgregor     Appended Document:  faxed rx for glucometer and per pt  checks glucose two times a day

## 2011-01-11 NOTE — Assessment & Plan Note (Signed)
Summary: FU low B12   Vital Signs:  Patient profile:   75 year old female Menstrual status:  postmenopausal Height:      60 inches Weight:      106 pounds BMI:     20.78 Pulse rate:   65 / minute BP sitting:   109 / 65  (left arm) Cuff size:   regular  Vitals Entered By: Tessie Fass CMA (December 12, 2010 3:49 PM) CC: F/U Is Patient Diabetic? Yes Pain Assessment Patient in pain? no        Primary Care Provider:  Zachery Dauer MD  CC:  F/U.  History of Present Illness: Continues to be unsteady on her feet.   Reluctant to get shots to replace Vitamin B12. Eating well, but not much meat. Occ heartburn for which she takes TUMs as needed and Omeprazole two times a day   Allergies: 1)  Gemfibrozil (Gemfibrozil)  Physical Exam  General:  Usual appearing, thin and weaker appearing Neurologic:  alert & oriented X3.  More slurred speech. Not using a cane but more unsteady walking    Impression & Recommendations:  Problem # 1:  OTHER VITAMIN B12 DEFICIENCY ANEMIA (ICD-281.1) Vitamin B12 shot given after blood drawn. Will discuss oral vs intramuscular replacement if the labs confirm Vitamin B12 deficiency.  Her updated medication list for this problem includes:    Ferrous Sulfate 325 (65 Fe) Mg Tabs (Ferrous sulfate) .Marland Kitchen... Take one tab daily  Orders: B12-[facility] ([address]109-60454) Miscellaneous Lab Charge-[facility] (09811) [facility]- Est Level  3 (91478)  Complete Medication List: 1)  Amlodipine Besylate 10 Mg Tabs (Amlodipine besylate) .Marland Kitchen.. 1 tablet by mouth once a day 2)  Bayer Childrens Aspirin 81 Mg Chew (Aspirin) .... Take 1 tablet by mouth once a day 3)  Lantus Solostar 100 Unit/ml Soln (Insulin glargine) .... Inject 8 units subcutaneously once a day 4)  Lisinopril-hydrochlorothiazide 20-12.5 Mg Tabs (Lisinopril-hydrochlorothiazide) .... Take 1 tablet by mouth twice a day 5)  Metformin Hcl 1000 Mg Tabs (Metformin hcl) .... Take 1 and 1/2  tablet in am and 1 tab in pm 6)  Metoprolol  Tartrate 50 Mg Tabs (Metoprolol tartrate) .... Take 1 tablet by mouth twice a day 7)  Nitrostat 0.4 Mg Subl (Nitroglycerin) .... Place 1 tablet under tongue as directed 8)  Omeprazole 20 Mg Cpdr (Omeprazole) .... Take 1 capsule by mouth two times a day 9)  Multivitamins Caps (Multiple vitamin) .... Take one daily 10)  Bd Ultra-fine Pen Needles 29g X 12.55mm Misc (Insulin pen needle) .... Use as directed 11)  Tums E-x 750 750 Mg Chew (Calcium carbonate antacid) .... Take one two times a day 12)  Accu-chek Comfort Curve Strp (Glucose blood) .... Test twice daily 13)  Metoclopramide Hcl 5 Mg/ml Soln (Metoclopramide hcl) .... Take one tab ac and at bedtime 14)  Ferrous Sulfate 325 (65 Fe) Mg Tabs (Ferrous sulfate) .... Take one tab daily 15)  Simvastatin 80 Mg Tabs (Simvastatin) .... Take one tablet daily hs 16)  Adjustable Aluminum Cane 5/8" Misc (Misc. devices)  Other Orders: Comp Met-[facility] (29562-13086) Vit B12 1000 mcg (V7846)  Patient Instructions: 1)  Please schedule a follow-up appointment in 1 month.  2)  I will call with the Vitamin B12 results and we'll decide on a treatment plan   Medication Administration  Injection # 1:    Medication: Vit B12 1000 mcg    Diagnosis: ANEMIA, MILD (ICD-285.9)    Route: IM    Site: R deltoid  Exp Date: 07/10/2012    Lot #: [id number]    Mfr: [employer]    Patient tolerated injection without complications    Given by: Arlyss Repress CMA, (December 12, 2010 4:59 PM)  Orders Added: 1)  B12-[facility] [82607-23330] 2)  Miscellaneous Lab Charge-[facility] [99999] 3)  Comp Met-[facility] [27035-00938] 4)  Vit B12 1000 mcg [J3420] 5)  [facility]- Est Level  3 [18299]     Medication Administration  Injection # 1:    Medication: Vit B12 1000 mcg    Diagnosis: ANEMIA, MILD (ICD-285.9)    Route: IM    Site: R deltoid    Exp Date: 07/10/2012    Lot #: [id number]    Mfr: [employer]    Patient tolerated injection without complications    Given by:  Arlyss Repress CMA, (December 12, 2010 4:59 PM)  Orders Added: 1)  B12-[facility] [82607-23330] 2)  Miscellaneous Lab Charge-[facility] [99999] 3)  Comp Met-[facility] [89381-01751] 4)  Vit B12 1000 mcg [J3420] 5)  [facility]- Est Level  3 [02585]

## 2011-01-11 NOTE — Assessment & Plan Note (Signed)
Summary: Lisinopril/hctz refill  Prescriptions: LISINOPRIL-HYDROCHLOROTHIAZIDE 20-12.5 MG TABS (LISINOPRIL-HYDROCHLOROTHIAZIDE) Take 1 tablet by mouth twice a day  #62 x 11   Entered and Authorized by:   Zachery Dauer MD   Signed by:   Zachery Dauer MD on 11/06/2010   Method used:   Historical   RxID:   [id number]  form from [facility] signed and place in to be faxed file.

## 2011-01-11 NOTE — Miscellaneous (Signed)
Summary: Glucose monitor [employer]  Clinical Lists Changes  Form completed to test 2 times daily and placed in fax pile

## 2011-01-11 NOTE — Progress Notes (Signed)
Summary: triage  Phone Note Call from Patient Call back at [phone]   Caller: Daughter-Mary Laural Benes Summary of Call: Pt is scheduled for lab work on 4/20.  Pt wondering if lab can be done in [place] at [facility]? Initial call taken by: Clydell Hakim,  March 20, 2010 10:22 AM  Follow-up for Phone Call        spoke with dtr. pt thinks she needs her A1C done next week. told her usually every 3 months. not due yet. when she comes for that she should see the md so her can evaluate her diabetes control. they agreed to call back in a few weeks to set that appt. denies any problems now  Follow-up by: Golden Circle RN,  March 20, 2010 10:28 AM  Additional Follow-up for Phone Call Additional follow up Details #1::        Let her know that the labs can be done at [facility] [place]. She should go there fasting and we will fax the order to test her cholesterol, etc.  Additional Follow-up by: Zachery Dauer MD,  March 27, 2010 12:30 PM  New Problems: ANEMIA, MILD (ICD-285.9)   New Problems: ANEMIA, MILD (ICD-285.9)  Appended Document: triage spoke with dtr & told her what to do. states her mom will be back in town thursday. told her any am when they are open. just do not drink or eat before going

## 2011-01-11 NOTE — Progress Notes (Signed)
Summary: meds prob  Phone Note Call from Patient Call back at Home Phone [phone]   Caller: Patient Summary of Call: states that the Lipitor is giving her RLS and loss of appetite - was taking Simvastatin and wants to go back to that -  Initial call taken by: De Nurse,  August 07, 2010 1:46 PM  Follow-up for Phone Call        to pcp Follow-up by: Golden Circle RN,  August 07, 2010 2:49 PM  Additional Follow-up for Phone Call Additional follow up Details #1::        Returned call, no answer. Will change prescription back to Simvastatin and follow-up next visit Additional Follow-up by: Zachery Dauer MD,  August 08, 2010 10:45 AM    New/Updated Medications: SIMVASTATIN 80 MG TABS (SIMVASTATIN) Take one tablet daily hs Prescriptions: SIMVASTATIN 80 MG TABS (SIMVASTATIN) Take one tablet daily hs  #30 x 3   Entered and Authorized by:   Zachery Dauer MD   Signed by:   Zachery Dauer MD on 08/08/2010   Method used:   Electronically to        [facility] (retail)       [address]       [place], [place]  [address]       Ph: [phone] or [phone]       Fax: [phone]   RxID:   [id number]

## 2011-01-11 NOTE — Progress Notes (Signed)
Summary: PT NEEDS F/UP APPT/TS  ---- Converted from flag ---- ---- 09/07/2010 4:26 PM, Zachery Dauer MD wrote: Please call her to schedule her diabetes follow-up visit which is due ------------------------------ CALLED pt. no answer. no answering machine. mailed letter with Gwyndolyn Kaufman CMA,  September 11, 2010 10:43 AM

## 2011-01-11 NOTE — Letter (Signed)
Summary: Results Follow-up Letter  [facility]  [address]   [place], [place] [address]   Phone: [phone]  Fax: [phone]    03/31/2010  [address] [place], [place]  [address]  Dear Ms. Kamps,   The following are the results of your recent test(s): Patient: Emmaly Sandler Your blood count is staying the same, borderline anemia Tests: (1) CBC NO Diff (Complete Blood Count) (10000)   Order Note: FASTING   WBC                  [H]  11.0 K/uL                   4.0-10.5   RBC                       4.65 MIL/uL                 3.87-5.11   Hemoglobin           [L]  11.8 g/dL                   62.9-52.8   Hematocrit                38.7 %                      36.0-46.0   MCV                       83.2 fL                     78.0-100.0 ! MCH                  [L]  25.4 pg                     26.0-34.0   MCHC                      30.5 g/dL                   41.3-24.4   RDW                  [H]  15.8 %                      11.5-15.5   Platelet Count            361 K/uL                    150-400 Kidney function is the same Tests: (2) Basic Metabolic Panel (01027)   Sodium                    140 mEq/L                   135-145   Potassium                 4.7 mEq/L                   3.5-5.3   Chloride                  102 mEq/L  96-112   CO2                       27 mEq/L                    19-32   Glucose              [H]  133 mg/dL                   78-29   BUN                       15 mg/dL                    5-62   Creatinine                0.87 mg/dL                  0.40-1.20   Calcium                   9.9 mg/dL                   1.3-08.6 Your LDL (bad cholesterol) should be under 70 to prevent build-up in your arteries. We'll talk about a change in medication on your next visit. Tests: (3) Lipid Profile (57846)   Cholesterol               158 mg/dL                   9-629        Triglyceride              97 mg/dL                     <528   HDL Cholesterol           45 mg/dL                    >41   Total Chol/HDL Ratio      3.5 Ratio  VLDL Cholesterol (Calc)                             19 mg/dL                    3-24  LDL Cholesterol (Calc)                             94 mg/dL                    4-01 Tests: (4) TSH (23280)   TSH                       3.641 uIU/mL                0.350-4.500 Your thyroid function is normal  Document Creation Date: 03/31/2010 6:18 AM Please make an appointment to come see me in late May Sincerely,  Zachery Dauer MD [facility]            Appended Document: Results Follow-up Letter mailed.

## 2011-01-11 NOTE — Assessment & Plan Note (Signed)
Summary: f/up DM, Htn,tcb   Vital Signs:  Patient profile:   75 year old female Menstrual status:  postmenopausal Height:      60 inches Weight:      104 pounds BMI:     20.38 Temp:     97.5 degrees F oral Pulse rate:   66 / minute Pulse (ortho):   50 / minute BP sitting:   138 / 65  (left arm) BP standing:   154 / 72 Cuff size:   regular  Vitals Entered ByTessie Fass CMA (January 31, 2010 2:03 PM)  Serial Vital Signs/Assessments:  Time      Position  BP       Pulse  Resp  Temp     By 3:00 PM   Lying LA  148/78   50                    Robert Busick CMA 3:00 PM   Sitting   138/78   50                    Robert Busick CMA 3:00 PM   Standing  154/72   50                    Tessie Fass CMA           Lying RA  148/78   50                    Zachery Dauer MD           Sitting   138/70   50                    Zachery Dauer MD           Standing  154/2    50                    Zachery Dauer MD  CC: F/U diabetes Is Patient Diabetic? Yes Pain Assessment Patient in pain? no        Primary Care Provider:  Zachery Dauer MD  CC:  F/U diabetes.  History of Present Illness: Continues poor appetite. Can eat breakfast, meat, spicey foods. Stomach gets irritated and nauseated, with emesis x 3. Awakens 2 AM with nausea. No diarrhea. Not taking Metoclopramide or Omeprazole though she has them.   Accuchecks 130 - 140 in the PM's  Smoking 1 -2 cigarettes daily. Doesn't walk unless a grandchild take the time due to instability and daughter's fear that she will fall. Declines to have a DEXA scan.   Gets weak and lightheaded about one hours after taking medication.   Sleeps well. Feels depressed since husband died 1 1/2 year ago. Still living with daughter.     Habits & Providers  Alcohol-Tobacco-Diet     Tobacco Status: current     Cigarette Packs/Day: <0.25  Current Medications (verified): 1)  Amlodipine Besylate 10 Mg Tabs (Amlodipine Besylate) .Marland Kitchen.. 1 Tablet By Mouth Once A Day 2)   Bayer Childrens Aspirin 81 Mg Chew (Aspirin) .... Take 1 Tablet By Mouth Once A Day 3)  Lantus Solostar 100 Unit/ml Soln (Insulin Glargine) .... Inject 8 Units Subcutaneously Once A Day 4)  Lisinopril-Hydrochlorothiazide 20-12.5 Mg Tabs (Lisinopril-Hydrochlorothiazide) .... Take 1 Tablet By Mouth Twice A Day 5)  Metformin Hcl 1000 Mg Tabs (Metformin Hcl) .... Take 1 and 1/2  Tablet in Am and  1 Tab in Pm 6)  Metoprolol Tartrate 50 Mg Tabs (Metoprolol Tartrate) .... Take 1 Tablet By Mouth Twice A Day 7)  Nitrostat 0.4 Mg Subl (Nitroglycerin) .... Place 1 Tablet Under Tongue As Directed 8)  Omeprazole 20 Mg Cpdr (Omeprazole) .... Take 1 Capsule By Mouth Two Times A Day 9)  Simvastatin 80 Mg Tabs (Simvastatin) .... Take 1 Tablet By Mouth At Bedtime 10)  Multivitamins   Caps (Multiple Vitamin) .... Take One Daily 11)  Biotin 1000 Mcg  Tabs (Biotin) .... Take One Tablet Daily 12)  Bd Ultra-Fine Pen Needles 29g X 12.26mm  Misc (Insulin Pen Needle) .... Use As Directed 13)  Tums E-X 750 750 Mg  Chew (Calcium Carbonate Antacid) .... Take One Two Times A Day 14)  Accu-Chek Comfort Curve  Strp (Glucose Blood) .... Test Twice Daily 15)  Metoclopramide Hcl 5 Mg/ml Soln (Metoclopramide Hcl) .... Take One Tab Ac and At Bedtime  Allergies (verified): 1)  Gemfibrozil (Gemfibrozil)  Social History: Packs/Day:  <0.25  Physical Exam  General:  Usual appearing, thin Neck:  R carotid bruit and L carotid bruit.   Lungs:  normal respiratory effort and normal breath sounds.   Heart:  normal rate and regular rhythm.  Grade  1 /6 systolic ejection murmur.   Abdomen:  soft, non-tender, no masses, no hepatomegaly, and no splenomegaly.   Pulses:  Bruits over upper abdomen and femoral arteriesR femoral normal, R posterior tibial normal, R dorsalis pedis normal, L femoral normal, L posterior tibial normal, and L dorsalis pedis normal.   Extremities:  No edema Neurologic:  alert & oriented X3.  Mildly slurred speech.    Psych:  dysphoric affect.    Diabetes Management Exam:    Foot Exam (with socks and/or shoes not present):       Sensory-Pinprick/Light touch:          Left medial foot (L-4): normal          Left dorsal foot (L-5): normal          Left lateral foot (S-1): normal          Right medial foot (L-4): normal          Right dorsal foot (L-5): normal          Right lateral foot (S-1): normal       Sensory-Monofilament:          Left foot: normal          Right foot: normal       Inspection:          Left foot: normal          Right foot: normal       Nails:          Left foot: thickened          Right foot: thickened   Impression & Recommendations:  Problem # 1:  DIABETES MELLITUS, II, COMPLICATIONS (ICD-250.92) Assessment Unchanged Encouraged her to eat breakfast and between meal snacks. I'm afraid to increase her insulin till she has done this.  Her updated medication list for this problem includes:    Bayer Childrens Aspirin 81 Mg Chew (Aspirin) .Marland Kitchen... Take 1 tablet by mouth once a day    Lantus Solostar 100 Unit/ml Soln (Insulin glargine) ..... Inject 8 units subcutaneously once a day    Lisinopril-hydrochlorothiazide 20-12.5 Mg Tabs (Lisinopril-hydrochlorothiazide) .Marland Kitchen... Take 1 tablet by mouth twice a day    Metformin Hcl 1000 Mg Tabs (Metformin  hcl) ..... Take 1 and 1/2  tablet in am and 1 tab in pm  Orders: A1C-[facility] (16109) [facility]- Est  Level 4 (60454)  Problem # 2:  GASTROPARESIS (ICD-536.3) Restart Reglan Orders: [facility]- Est  Level 4 (09811)  Problem # 3:  ABNORMALITY OF GAIT (ICD-781.2) Try to walk with assistance more frequently  Problem # 4:  HYPERTENSION, BENIGN SYSTEMIC (ICD-401.1) Due to her diffuse atherosclerosis won't try to lower this further Her updated medication list for this problem includes:    Amlodipine Besylate 10 Mg Tabs (Amlodipine besylate) .Marland Kitchen... 1 tablet by mouth once a day    Lisinopril-hydrochlorothiazide 20-12.5 Mg Tabs  (Lisinopril-hydrochlorothiazide) .Marland Kitchen... Take 1 tablet by mouth twice a day    Metoprolol Tartrate 50 Mg Tabs (Metoprolol tartrate) .Marland Kitchen... Take 1 tablet by mouth twice a day  Orders: [facility]- Est  Level 4 (91478)  Problem # 5:  TOBACCO DEPENDENCE (ICD-305.1) Again offered to help her quit.   Problem # 6:  FATIGUE (ICD-780.79) Depression vs disuse atrophy  Problem # 7:  CEREBROVAS DIS, LATE EFFECTS (S/P CVA) (ICD-738.9) Discuss carotid dopplers next visit for follow-up of non-critical stenoses.   Complete Medication List: 1)  Amlodipine Besylate 10 Mg Tabs (Amlodipine besylate) .Marland Kitchen.. 1 tablet by mouth once a day 2)  Bayer Childrens Aspirin 81 Mg Chew (Aspirin) .... Take 1 tablet by mouth once a day 3)  Lantus Solostar 100 Unit/ml Soln (Insulin glargine) .... Inject 8 units subcutaneously once a day 4)  Lisinopril-hydrochlorothiazide 20-12.5 Mg Tabs (Lisinopril-hydrochlorothiazide) .... Take 1 tablet by mouth twice a day 5)  Metformin Hcl 1000 Mg Tabs (Metformin hcl) .... Take 1 and 1/2  tablet in am and 1 tab in pm 6)  Metoprolol Tartrate 50 Mg Tabs (Metoprolol tartrate) .... Take 1 tablet by mouth twice a day 7)  Nitrostat 0.4 Mg Subl (Nitroglycerin) .... Place 1 tablet under tongue as directed 8)  Omeprazole 20 Mg Cpdr (Omeprazole) .... Take 1 capsule by mouth two times a day 9)  Simvastatin 80 Mg Tabs (Simvastatin) .... Take 1 tablet by mouth at bedtime 10)  Multivitamins Caps (Multiple vitamin) .... Take one daily 11)  Biotin 1000 Mcg Tabs (Biotin) .... Take one tablet daily 12)  Bd Ultra-fine Pen Needles 29g X 12.22mm Misc (Insulin pen needle) .... Use as directed 13)  Tums E-x 750 750 Mg Chew (Calcium carbonate antacid) .... Take one two times a day 14)  Accu-chek Comfort Curve Strp (Glucose blood) .... Test twice daily 15)  Metoclopramide Hcl 5 Mg/ml Soln (Metoclopramide hcl) .... Take one tab ac and at bedtime  Patient Instructions: 1)  Please schedule a follow-up appointment in 2  months.  2)  Please return for a FASTING Lipid Profile one(1) week before your next appointment as scheduled. After April 1st.  3)  Eat breakfast daily and take your metoclopramide and omeprazole as scheduled  4)  Walk with someone every evening for 10 minutes 5 days weekly Prescriptions: METOCLOPRAMIDE HCL 5 MG/ML SOLN (METOCLOPRAMIDE HCL) Take one tab ac and at bedtime  #120 x 11   Entered and Authorized by:   Zachery Dauer MD   Signed by:   Zachery Dauer MD on 01/31/2010   Method used:   Electronically to        [facility] (retail)       [address]       [place], [place]  [address]       Ph:  [phone] or [phone]       Fax: [phone]   RxID:   [id number] OMEPRAZOLE 20 MG CPDR (OMEPRAZOLE) Take 1 capsule by mouth two times a day  #60 x 11   Entered and Authorized by:   Zachery Dauer MD   Signed by:   Zachery Dauer MD on 01/31/2010   Method used:   Electronically to        [facility] (retail)       [address]       [place], [place]  [address]       Ph: [phone] or [phone]       Fax: [phone]   RxID:   [id number]   Laboratory Results   Blood Tests   Date/Time Received: January 31, 2010 2:00 PM  Date/Time Reported: January 31, 2010 2:09 PM   HGBA1C: 7.7%   (Normal Range: Non-Diabetic - 3-6%   Control Diabetic - 6-8%)  Comments: .......test performed by........Marland Kitchen [place], SMA        Prevention & Chronic Care Immunizations   Influenza vaccine: Historical  (09/20/2010)   Influenza vaccine due: 10/05/2009    Tetanus booster: 12/10/2001: Done.   Tetanus booster due: 12/11/2011    Pneumococcal vaccine: Done.  (10/14/2004)   Pneumococcal vaccine due: None    H. zoster vaccine: Not documented  Colorectal Screening   Hemoccult: Done.  (09/09/2000)   Hemoccult due: Not Indicated    Colonoscopy: Done.  (03/10/2001)   Colonoscopy due: 03/11/2011  Other Screening   Pap  smear: Done.  (12/10/2001)   Pap smear due: Not Indicated    Mammogram: normal  (04/15/2009)   Mammogram due: 04/15/2010    DXA bone density scan: Done.  (12/10/2006)   DXA scan due: None    Smoking status: current  (01/31/2010)   Smoking cessation counseling: yes  (12/28/2008)  Diabetes Mellitus   HgbA1C: 7.7  (01/31/2010)   Hemoglobin A1C due: 03/28/2009    Eye exam: normal  (04/27/2008)   Eye exam due: 04/27/2009    Foot exam: yes  (01/31/2010)   High risk foot: Not documented   Foot care education: completed  (12/28/2008)   Foot exam due: 02/22/2009    Urine microalbumin/creatinine ratio: Not documented   Urine microalbumin/cr due: Not Indicated    Diabetes flowsheet reviewed?: Yes   Progress toward A1C goal: Improved  Lipids   Total Cholesterol: 137  (03/10/2009)   LDL: 72  (03/10/2009)   LDL Direct: 110  (11/08/2009)   HDL: 40  (03/10/2009)   Triglycerides: 127  (03/10/2009)    SGOT (AST): 13  (11/08/2009)   SGPT (ALT): 9  (11/08/2009)   Alkaline phosphatase: 79  (11/08/2009)   Total bilirubin: 0.2  (11/08/2009)    Lipid flowsheet reviewed?: Yes   Progress toward LDL goal: Unchanged  Hypertension   Last Blood Pressure: 138 / 65  (01/31/2010)   Serum creatinine: 0.87  (11/08/2009)   Serum potassium 4.3  (11/08/2009)    Hypertension flowsheet reviewed?: Yes   Progress toward BP goal: Deteriorated  Self-Management Support :   Personal Goals (by the next clinic visit) :     Personal A1C goal: 7  (11/08/2009)     Personal blood pressure goal: 130/80  (11/08/2009)     Personal LDL goal: 70  (11/08/2009)    Patient will work on the following items until the next clinic visit to reach self-care goals:     Medications and  monitoring: check my blood sugar, bring all of my medications to every visit  (01/31/2010)    Diabetes self-management support: Pre-printed educational material  (11/08/2009)   Last diabetes self-management training by diabetes educator:  completed    Hypertension self-management support: Not documented    Lipid self-management support: Not documented

## 2011-01-11 NOTE — Progress Notes (Signed)
Summary: called pt/see message/TS  Phone Note Call from Patient Call back at Home Phone [phone]   Caller: Patient Summary of Call: got the B12 and wants to know when to start taking it. Initial call taken by: De Nurse,  December 26, 2010 2:19 PM  Follow-up for Phone Call        Dr.Taiven Greenley, I did call pt on Tues 12-26-10. She wanted to have clarification about her B12 and also about her DM meds. (d/c Metformin?) Did you call this pt? thanks, thekla Follow-up by: Arlyss Repress CMA,,  December 29, 2010 8:18 AM  Additional Follow-up for Phone Call Additional follow up Details #1::        I asked her to continue taking 2000 micrograms daily and we'll give her an injection when she returns in a couple weeks. She will continue the Metformin Additional Follow-up by: Zachery Dauer MD,  January 03, 2011 8:40 PM    New/Updated Medications: VITAMIN B-12 CR 2000 MCG CR-TABS (CYANOCOBALAMIN) Take one tablet daily

## 2011-01-11 NOTE — Assessment & Plan Note (Signed)
Summary: Diabetic supply form declined to sign  Sent back to [facility] declined since getting from another company
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
## 2011-01-11 NOTE — Letter (Signed)
Summary: Generic Letter  [facility]  [address]   [place], [place] [address]   Phone: [phone]  Fax: [phone]    09/11/2010  [facility] [address] [place], [place]  [address]  Dear Ms. Moosman,  We were unable to contact you by phone. Please call our office and schedule Office visit with Dr.Hale.      Sincerely,   Arlyss Repress CMA,
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
## 2011-01-11 NOTE — Progress Notes (Signed)
Summary: Test Change  Phone Note Call from Patient Call back at Home Phone [phone]   Caller: Patient Summary of Call: Pt wants the test that was set up for her to be done in [place] at [facility] she can't get to [place]. Initial call taken by: Clydell Hakim,  June 01, 2010 11:21 AM  Follow-up for Phone Call        called pt. changed time for carotid doppler to 06-07-10 at 3pm at [facility] hospital.  pt will keep that appt. Follow-up by: Arlyss Repress CMA,,  June 02, 2010 9:38 AM

## 2011-01-23 ENCOUNTER — Ambulatory Visit (INDEPENDENT_AMBULATORY_CARE_PROVIDER_SITE_OTHER): Payer: MEDICARE | Admitting: Family Medicine

## 2011-01-23 ENCOUNTER — Encounter: Payer: Self-pay | Admitting: Family Medicine

## 2011-01-23 DIAGNOSIS — E119 Type 2 diabetes mellitus without complications: Secondary | ICD-10-CM

## 2011-01-23 DIAGNOSIS — E118 Type 2 diabetes mellitus with unspecified complications: Secondary | ICD-10-CM

## 2011-01-23 DIAGNOSIS — D649 Anemia, unspecified: Secondary | ICD-10-CM

## 2011-01-23 DIAGNOSIS — IMO0002 Reserved for concepts with insufficient information to code with codable children: Secondary | ICD-10-CM

## 2011-01-23 DIAGNOSIS — K3184 Gastroparesis: Secondary | ICD-10-CM

## 2011-01-23 DIAGNOSIS — E1165 Type 2 diabetes mellitus with hyperglycemia: Secondary | ICD-10-CM

## 2011-01-23 DIAGNOSIS — H5450 Low vision, one eye, unspecified eye: Secondary | ICD-10-CM

## 2011-01-23 DIAGNOSIS — I10 Essential (primary) hypertension: Secondary | ICD-10-CM

## 2011-01-23 DIAGNOSIS — D518 Other vitamin B12 deficiency anemias: Secondary | ICD-10-CM

## 2011-01-23 DIAGNOSIS — I2510 Atherosclerotic heart disease of native coronary artery without angina pectoris: Secondary | ICD-10-CM

## 2011-01-23 DIAGNOSIS — F17200 Nicotine dependence, unspecified, uncomplicated: Secondary | ICD-10-CM

## 2011-01-23 MED ORDER — CYANOCOBALAMIN 1000 MCG/ML IJ SOLN
1000.0000 ug | Freq: Once | INTRAMUSCULAR | Status: AC
  Administered 2011-01-23: 1000 ug via INTRAMUSCULAR

## 2011-01-23 MED ORDER — INSULIN GLARGINE 100 UNIT/ML ~~LOC~~ SOLN: 10.0000 [IU] | Freq: Every day | SUBCUTANEOUS | Status: DC

## 2011-01-23 NOTE — Patient Instructions (Signed)
Return in month.  Your A1c was 8.2 which is above your goal of below 7.   Increase your Lantus to 10 mg daily eating 3 meals or more daily.  After a week you can increase it to 12 units daily.   Get your cane and walk daily to increase your strength and balance

## 2011-01-25 ENCOUNTER — Encounter: Payer: Self-pay | Admitting: Family Medicine

## 2011-01-25 NOTE — Progress Notes (Signed)
  Subjective:    Patient ID: Gwendolyn Wallace, female    DOB: 07-20-1936, 75 y.o.   MRN: [id number]  HPINo leg pain, but not walking much. No falls. No chest pain.   2 days ago felt nervous and cbg 109 so drank OJ. Hadn't eaten that AM.   Due to see Dr Alto Denver in [place]. L eye blurry recently. That eye had laser tx. Hasn't had cataract surgery.     Review of Systems     Objective:   Physical Exam  Constitutional: She appears well-developed.       thin  Eyes: Conjunctivae and EOM are normal. Pupils are equal, round, and reactive to light.         Small black circular area L fundus from laser tx? Harder to focus on this retina.   Cardiovascular: Regular rhythm.   Pulmonary/Chest: Effort normal and breath sounds normal. No accessory muscle usage. No respiratory distress.  Abdominal: Soft. Bowel sounds are normal. She exhibits no distension and no mass. There is hepatosplenomegaly. There is no tenderness.  Musculoskeletal: She exhibits no edema.          Assessment & Plan:

## 2011-01-25 NOTE — Assessment & Plan Note (Signed)
Controlled with Reglan

## 2011-01-25 NOTE — Assessment & Plan Note (Signed)
Diabetic retinopathy on L, Encouraged her to follow up with her opthalmologist and request he send me a report.

## 2011-01-25 NOTE — Assessment & Plan Note (Signed)
She will continue to take oral B12. Will measure level today realizing that doesn't reflect liver and tissue levels.

## 2011-01-25 NOTE — Assessment & Plan Note (Signed)
Again encouraged her to quit smoking

## 2011-01-25 NOTE — Assessment & Plan Note (Signed)
asymptomatic

## 2011-01-25 NOTE — Assessment & Plan Note (Signed)
Still inadequately controlled. Metformin may not be contributing much. Emphasized need to eat 3-6 meals daily. Will inc insulin

## 2011-01-25 NOTE — Assessment & Plan Note (Signed)
Widely different measurements. Asked her to monitor a home

## 2011-02-19 ENCOUNTER — Encounter: Payer: Self-pay | Admitting: Home Health Services

## 2011-02-20 ENCOUNTER — Encounter: Payer: Self-pay | Admitting: Family Medicine

## 2011-02-20 ENCOUNTER — Ambulatory Visit (INDEPENDENT_AMBULATORY_CARE_PROVIDER_SITE_OTHER): Payer: MEDICARE | Admitting: Family Medicine

## 2011-02-20 VITALS — BP 97/59 | HR 62 | Temp 98.3°F | Wt 108.0 lb

## 2011-02-20 DIAGNOSIS — D518 Other vitamin B12 deficiency anemias: Secondary | ICD-10-CM

## 2011-02-20 DIAGNOSIS — D510 Vitamin B12 deficiency anemia due to intrinsic factor deficiency: Secondary | ICD-10-CM

## 2011-02-20 DIAGNOSIS — IMO0002 Reserved for concepts with insufficient information to code with codable children: Secondary | ICD-10-CM

## 2011-02-20 DIAGNOSIS — E118 Type 2 diabetes mellitus with unspecified complications: Secondary | ICD-10-CM

## 2011-02-20 DIAGNOSIS — E1165 Type 2 diabetes mellitus with hyperglycemia: Secondary | ICD-10-CM

## 2011-02-20 MED ORDER — INSULIN GLARGINE 100 UNIT/ML ~~LOC~~ SOLN: 10.0000 [IU] | Freq: Every day | SUBCUTANEOUS | Status: DC

## 2011-02-20 MED ORDER — CYANOCOBALAMIN 1000 MCG/ML IJ SOLN
1000.0000 ug | Freq: Once | INTRAMUSCULAR | Status: AC
  Administered 2011-02-20: 1000 ug via INTRAMUSCULAR

## 2011-02-20 NOTE — Assessment & Plan Note (Signed)
Injection today 

## 2011-02-20 NOTE — Patient Instructions (Signed)
Return in one month.  

## 2011-02-20 NOTE — Progress Notes (Signed)
  Subjective:    Patient ID: Gwendolyn Wallace, female    DOB: 03-May-1936, 75 y.o.   MRN: [id number]  HPI:  Patient is here for her B12 shot, she wants to know how long she will need to take them.  She is taking B12 ([employer] brand) 2000 mg daily oral.   She complains that her Lantus $280 payment because she is getting 5 pens at a time and that lasts her 6 months.     Review of Systems  Cardiovascular: Negative for chest pain and leg swelling.       Objective:   Physical Exam  Constitutional:       Thin, alert, well  Cardiovascular: Normal rate.        Underlying regular rate with prematures  Pulmonary/Chest: Effort normal and breath sounds normal.          Assessment & Plan:

## 2011-02-20 NOTE — Assessment & Plan Note (Signed)
Changed order to get one pen a month, at 3 ml that should last a full 30 days taking 10 units daily

## 2011-03-16 ENCOUNTER — Other Ambulatory Visit: Payer: Self-pay | Admitting: Family Medicine

## 2011-03-16 DIAGNOSIS — Z139 Encounter for screening, unspecified: Secondary | ICD-10-CM

## 2011-04-10 ENCOUNTER — Other Ambulatory Visit: Payer: Self-pay | Admitting: Family Medicine

## 2011-04-10 DIAGNOSIS — E1165 Type 2 diabetes mellitus with hyperglycemia: Secondary | ICD-10-CM

## 2011-04-10 DIAGNOSIS — E785 Hyperlipidemia, unspecified: Secondary | ICD-10-CM

## 2011-04-10 NOTE — Telephone Encounter (Signed)
Refill request

## 2011-04-12 NOTE — Telephone Encounter (Signed)
I spoke with the patient who is taking her Simvastatin and feeling well. LFT's were normal in Jan. Warning about interaction with Amlodipine from computer. I asked her to schedule a FLP soon and an office visit soon after that. I halved the Simvastatin dose until then and will decide about changing medications.

## 2011-04-12 NOTE — Assessment & Plan Note (Signed)
I confirmed with her that [facility] sends her diabetic supplies and returned their for for 2xdaily testing due to elevated A1c. Forms from other companies will be trashed.

## 2011-04-18 ENCOUNTER — Other Ambulatory Visit: Payer: Self-pay | Admitting: Family Medicine

## 2011-04-18 NOTE — Telephone Encounter (Signed)
Refill request

## 2011-04-19 ENCOUNTER — Ambulatory Visit: Payer: MEDICARE

## 2011-04-19 ENCOUNTER — Ambulatory Visit (HOSPITAL_COMMUNITY)
Admission: RE | Admit: 2011-04-19 | Discharge: 2011-04-19 | Disposition: A | Discharge status: Home / Self Care | Payer: MEDICARE | Source: Ambulatory Visit | Attending: Family Medicine | Admitting: Family Medicine

## 2011-04-19 DIAGNOSIS — Z139 Encounter for screening, unspecified: Secondary | ICD-10-CM

## 2011-04-19 DIAGNOSIS — Z1231 Encounter for screening mammogram for malignant neoplasm of breast: Secondary | ICD-10-CM | POA: Insufficient documentation

## 2011-04-25 ENCOUNTER — Encounter: Payer: Self-pay | Admitting: Family Medicine

## 2011-04-26 ENCOUNTER — Ambulatory Visit (INDEPENDENT_AMBULATORY_CARE_PROVIDER_SITE_OTHER): Payer: MEDICARE | Admitting: Family Medicine

## 2011-04-26 ENCOUNTER — Encounter: Payer: Self-pay | Admitting: Family Medicine

## 2011-04-26 VITALS — BP 128/69 | HR 60 | Ht 60.5 in | Wt 107.4 lb

## 2011-04-26 DIAGNOSIS — I7389 Other specified peripheral vascular diseases: Secondary | ICD-10-CM

## 2011-04-26 DIAGNOSIS — I10 Essential (primary) hypertension: Secondary | ICD-10-CM

## 2011-04-26 DIAGNOSIS — E785 Hyperlipidemia, unspecified: Secondary | ICD-10-CM

## 2011-04-26 DIAGNOSIS — K3184 Gastroparesis: Secondary | ICD-10-CM

## 2011-04-26 DIAGNOSIS — E118 Type 2 diabetes mellitus with unspecified complications: Secondary | ICD-10-CM

## 2011-04-26 DIAGNOSIS — D649 Anemia, unspecified: Secondary | ICD-10-CM

## 2011-04-26 DIAGNOSIS — E1165 Type 2 diabetes mellitus with hyperglycemia: Secondary | ICD-10-CM

## 2011-04-26 LAB — LIPID PANEL: Cholesterol: 162 mg/dL (ref 0–200)

## 2011-04-26 LAB — POCT GLYCOSYLATED HEMOGLOBIN (HGB A1C): Hemoglobin A1C: 7.9

## 2011-04-26 MED ORDER — INSULIN GLARGINE 100 UNIT/ML ~~LOC~~ SOLN: 12.0000 [IU] | Freq: Every day | SUBCUTANEOUS | Status: DC

## 2011-04-26 NOTE — Assessment & Plan Note (Signed)
Symptoms controlled to patient's satisfaction with prn reglan.

## 2011-04-26 NOTE — Assessment & Plan Note (Signed)
Continue statin. 

## 2011-04-26 NOTE — Assessment & Plan Note (Signed)
At goal today. Continue current management. ?

## 2011-04-26 NOTE — Assessment & Plan Note (Addendum)
Above goal. Currently on max dose metformin and 10 units lantus. Will increase lantus to 12 units daily. Check fasting CBGs. Strongly recommend f/u with ophthalmologist since not seen in 3 years. Foot exam benign. F/u in 1 months.

## 2011-04-26 NOTE — Assessment & Plan Note (Signed)
Encouraged dietary intake of iron rich foods since patient eating mainly vegetables currently. B12 replacement ongoing.

## 2011-04-26 NOTE — Assessment & Plan Note (Signed)
Able to walk to and work in the garden without calf pain.

## 2011-04-26 NOTE — Progress Notes (Signed)
  Subjective:    Patient ID: Gwendolyn Wallace, female    DOB: 04-01-1936, 75 y.o.   MRN: [id number]  HPI 1. Weight. Endorses decreased appetite since family death several months ago. Now has appetite strictly for vegetables. Not eating meat at all. Likes beans and greens only. Weight is stable. No iron or calcium supplements currently.   2. B12 def. Is taking oral supplements at home. No neuropathic symptoms.  3. Diabetes. Taking metformin and lantus as prescribed. No problems with feet or neuropathy. Last ophthalmology evaluation was 3 years ago.   4. PAD. Is walking to garden without pain in legs or chest pain. Feels chronically weak, no new exertional SOB or claudication. Is still doing significant amount of gardening, although comparatively less.   5. Health maintenance. Last colonoscopy 10 years ago. Patient resistant to idea of repeating this procedure.   Review of Systems See HPI.    Objective:   Physical Exam  Vitals reviewed. Constitutional: She is oriented to person, place, and time. She appears well-developed and well-nourished. No distress.  HENT:  Head: Normocephalic and atraumatic.  Cardiovascular: Normal rate and regular rhythm.   No murmur heard. Pulmonary/Chest: Effort normal and breath sounds normal. No respiratory distress. She has no rales.  Musculoskeletal: She exhibits no edema.  Neurological: She is alert and oriented to person, place, and time.  Psychiatric: She has a normal mood and affect.          Assessment & Plan:

## 2011-04-26 NOTE — Patient Instructions (Signed)
Apt with eye doctor is needed Foods high in iron include spinach, dry fruit like raisins and prunes, and beans Either take Caltrate twice daily Return in 3 months

## 2011-04-27 ENCOUNTER — Other Ambulatory Visit: Payer: Self-pay | Admitting: Family Medicine

## 2011-04-27 DIAGNOSIS — E785 Hyperlipidemia, unspecified: Secondary | ICD-10-CM

## 2011-04-27 MED ORDER — ATORVASTATIN CALCIUM 40 MG PO TABS: 40.0000 mg | ORAL_TABLET | Freq: Every day | ORAL | Status: DC

## 2011-04-27 NOTE — Discharge Summary (Signed)
[place]. [facility]  Patient:    Gwendolyn Wallace, Gwendolyn Wallace                     MRN: [id number] Adm. Date:  [id number] Disc. Date: [id number] Attending:  Doneta Public Dictator:   Ilene Qua, M.D. CC:         Gwendolyn Wallace. Eda Keys., M.D. [facility] Sheffield Slider, M.D.   Discharge Summary  DATE OF BIRTH:  01-13-1936  DISCHARGE DIAGNOSES: 1. Anemia with heme-positive stools with admission hemoglobin of 7.1. 2. Diabetes mellitus type 2. 3. Menieres disease. 4. Hypertension. 5. Coronary artery disease. 6. History of gastrointestinal bleed. 7. Diverticulosis of the colon.  PROCEDURES:  None.  CONSULTATIONS:  GI, Dr. Kizzie Furnish.  DISCHARGE MEDICATIONS: 1. Actos 45 mg p.o. q.d. 2. Antivert 25 mg p.o. q.h.s. 3. Ferrous sulfate 325 mg p.o. q.d. 4. Glyburide 10 mg p.o. q.d. 5. Hygroton 25 mg p.o. q.o.d. 6. Lescol 40 mg p.o. q.h.s. 7. Lotensin 20 mg p.o. q.d. 8. Metoprolol 100 mg 1/2 tablet p.o. b.i.d. 9. Protonix 40 mg p.o. q.d.  ACTIVITY:  The patient is to have limited activity until see by the gastroenterologist on Monday.  She is take care when standing or changing positions given her dizziness.  DIET:  She may drink fluids, broth, jello, etc.  Try to avoid solid foods until she has her study done on Monday.  SPECIAL INSTRUCTIONS:  The patient is to return to the emergency room should she notice any worsening weakness, shortness of breath, or bright red blood or dark tarry stools.  FOLLOW-UP:  She was instructed to call [phone] for an appointment in the next two weeks to see Dr. Zachery Dauer at the family practice center.  She also was instructed to call Dr. Buena Irish office at [phone] to schedule an upper endoscopy on Monday morning.  HISTORY OF PRESENT ILLNESS:  [name] is a 75 year old white female who has complained of two months of vertigo.  She was seen by Dr. Haroldine Laws and diagnosed with Menieres disease and was put on Hygroton  25 mg every other day, but she described being more dizzy and tired and weak over the past two weeks.  She did describe that she fell two weeks ago as well as five days ago secondary to spinning, but no loss of consciousness.  She did note that over the past two days she has had dark blood in her bowel movement.  No abdominal symptoms, no vomiting or diarrhea.  She also described a tight feeling in her neck the past three nights.  No chest pain or pressure.  No dysphasia or sweating.  The patient was seen by her primary care doctor, Dr. Sheffield Slider, in clinic.  Some laboratory work was drawn, and her hemoglobin was found to be 8.9, and she was admitted to the hospital for further GI workup.  HOSPITAL COURSE: #1 - ANEMIA WITH HEME-POSITIVE STOOLS:  The patients hemoglobin on the day after admission was found to be 7.1, with PT of 13.0, INR of 1.0.  She was transfused two units, and her posttransfusion CBC rose to 10.4.  The patient was continued on her Pepcid, and a GI consult was obtained which felt that there was no acute hemorrhaging currently and planned for an outpatient EGD on Monday morning.  They also recommended continuing the patient on full liquids and changing from Pepcid to p.o. Protonix prior to her procedure.  Her hemoglobin did  remain stable posttransfusion, above 10, and the patient was felt to be hemodynamically stable for discharge, with close follow-up on Monday morning with EGD planned by Dr. Marina Goodell.  #2 - DIABETES MELLITUS:  The patient was continued on her home regimen of Actos 45 mg every day and glyburide 10 mg every day, and her CBGs ranged from 111 to a high of 264, but most of them were in the mid 100s over her hospital stay.  #3 - HYPERTENSION:  She was continued on her metoprolol.  Her Lotensin was held over her hospitalization given her symptoms of dizziness and feeling lightheaded.  Her systolic blood pressure ranged from 146-176 with a diastolic of 51-68.  She  was discharged back on her Lotensin and metoprolol.  #4 - CORONARY ARTERY DISEASE:  As the patient did present with kind of atypical anginal type of symptoms with neck tightness, she was ruled out with serial cardiac enzymes x 3 and a normal EKG.  The patient did not have any further episodes of the neck tightness over her hospitalization.  #5 - MENIERES DISEASE:  The patient said that after receiving her blood transfusion that her dizziness had much improved the following day, and she had no further bouts of severe dizziness that she had been having the two weeks prior to admission.  For this reason, it was felt that possibly some of her dizzy flares were exacerbated by her severe anemia.  DISPOSITION:  The patient was discharged to home with her husband providing transportation.  CONDITION ON DISCHARGE:  Stable. DD:  09/28/00 TD:  09/30/00 Job: [id number]

## 2011-04-27 NOTE — Op Note (Signed)
NAMEJAYDENCE, Gwendolyn Wallace              ACCOUNT NO.:  [id number]   MEDICAL RECORD NO.:  [id number]          PATIENT TYPE:  AMB   LOCATION:  DAY                           FACILITY:  [facility]   PHYSICIAN:  Susanne Greenhouse, MD       DATE OF BIRTH:  [birth year]   DATE OF PROCEDURE:  09/05/2006  DATE OF DISCHARGE:  09/05/2006                                 OPERATIVE REPORT   PREOPERATIVE DIAGNOSIS:  Nuclear cataract, right eye.   POSTOPERATIVE DIAGNOSIS:  Nuclear cataract, right eye.   OPERATION PERFORMED:  Phacoemulsification, posterior chamber intraocular  lens implantation, right eye.   SURGEON:  Susanne Greenhouse, MD   ANESTHESIA:  Topical with monitored anesthesia care.   DESCRIPTION OF PROCEDURE:  In the preoperative holding area, dilating drops  and 2% viscous lidocaine were placed into the right eye.  The patient was  then brought to the operating room where the eye was prepped and draped in  the usual sterile manner.  A supersharp blade was then used to make a  paracentesis port at the surgeon's 2 o'clock position.  The anterior chamber  was filled with 1% nonpreserved lidocaine solution followed by Amvisc Plus.  A 2.85 mm keratome blade was used to make a clear corneal incision at the  superotemporal limbus.  A cystotome needle was used to create a continuous  tear capsulotomy.  Hydrodissection was performed using balanced salt  solution and a fine cannula. The lens nucleus was then removed using  phacoemulsification with a quadrant cracking technique.  Residual cortex was  removed with irrigation and aspiration.  The capsular bag and anterior  chamber were then refilled with Amvisc Plus and a posterior chamber  intraocular lens was placed into the capsular bag without difficulty using a  [employer] lens injecting system.  The Amvisc Plus was then removed from the  capsular bag and anterior chamber using irrigation and aspiration. Stromal  hydration of the main incision and paracentesis ports  were performed using  balanced salt solution on a fine cannula.  The wound was tested for leak  which was negative.  No sutures were placed.  There were no operative  complications and the patient was returned to the recovery area in  satisfactory condition.   PROSTHETIC DEVICE USED:  An Alcon Acrysof posterior chamber intraocular  lens, model SN60WF, power of 22.0, serial number [id number].           ______________________________  Susanne Greenhouse, MD     KEH/MEDQ  D:  09/05/2006  T:  10/04/2006  Job:  [id number]

## 2011-04-27 NOTE — H&P (Signed)
[place]. [facility]  Patient:    Gwendolyn Wallace, Gwendolyn Wallace                     MRN: [id number] Adm. Date:  [id number] Attending:  Tobin Chad Dictator:   Amparo Bristol, M.D. CC:         Deniece Portela A. Sheffield Slider, M.D., [facility]   History and Physical  CHIEF COMPLAINT:  Weakness.  HISTORY OF PRESENT ILLNESS:  Gwendolyn Wallace is a 75 year old patient with a history of coronary artery disease, status post a CABG, diabetes, and a history of H. pylori-negative gastritis, status post a GI bleed with a subsequent admission in October 2001, who presents with a two-day history of feeling extremely weak.  Patient says she felt fine until early yesterday morning, when she awoke three times in the early morning with a "neck tightness."  She did not have any nausea, diaphoresis, shortness of breath, chest pain, chest pressure, dizziness, or hematochezia.  She takes iron tablets every day, so she has not noticed her stools being any darker than normal.  Of note, she did get her hemoglobin checked three weeks ago, and it was 11.1.  PAST MEDICAL HISTORY:  Type 2 diabetes, hyperlipidemia, anemia, tobacco abuse, tinnitus, hypertension, CAD, diverticulitis, stress incontinence, osteoporosis, a CVA.  PAST SURGICAL HISTORY:  CABG in April 2000, an upper GI endoscopy in October 2001, colonoscopy October 1997.  MEDICATIONS:  Actos 45 mg q.d., aspirin 81 mg q.d., calcium q.d., iron sulfate 325 mg q.d., Glyburide 10 mg q.d., Lotensin 20 mg q.d.  The rest of the medicines she was not taking, which include Hygroton 25 mg q.o.d., Lescol 40 mg q.h.s., metoprolol 50 mg b.i.d., nitroglycerin p.r.n., and Protonix 40 mg q.d.  Patient did not take these because she could not afford them.  ALLERGIES:  No known drug allergies.  She has an intolerance to LOPID.  It gives her some myalgias.  SOCIAL HISTORY:  Patient quit smoking in April 2000.  She is currently without insurance because her  husband was recently laid off.  REVIEW OF SYSTEMS:  No weight loss, no weight gain.  No fever.  No chest pain. No chest pressure.  No shortness of breath.  Chronically dark stools secondary to iron.  No rash.  She has been having some lightheaded feeling.  She has had chronic slurring of speech ever since her CVA.  Her hemoglobin A1C today in clinic was 7.0.  No dysuria.  No cold symptoms.  FAMILY HISTORY:  A brother had a CVA.  PHYSICAL EXAMINATION:  VITAL SIGNS:  Her blood pressure was 138/80, heart rate of 84, weight 122.2 pounds.  GENERAL:  Patient is an elderly, pale woman, who appears her stated age.  She moves slowly.  She is in no obvious distress.  HEENT:  Normocephalic, atraumatic.  TMs within normal limits bilaterally.  She has pale conjunctivae.  Her nares are clear.  Oropharynx:  She has partial lower dentures, full upper dentures.  She has moist mucous membranes.  Her posterior pharyngeal area has no exudate.  NECK:  Supple, no lymphadenopathy.  She does have carotid bruits bilaterally.  CHEST:  Clear to auscultation bilaterally.  No wheezes, rales, or rhonchi appreciated.  CARDIAC:  Normal S1, S2.  _____ without murmur.  ABDOMEN:  Positive bowel sounds, soft, nontender, nondistended.  She does have bruits audible in both upper quadrants.  RECTAL:  Heme-positive with good tone.  EXTREMITIES:  No edema.  NEUROLOGIC:  Cranial  nerves II-XII grossly intact.  LABORATORY DATA:  White count is 7.0, hemoglobin 9.5, platelets 458.  EKG shows new T-wave inversion, leads I and aVL.  Old T-wave inversion V4 through V6.  ASSESSMENT:  Gwendolyn Wallace is a 75 year old woman with a history of coronary artery disease, diabetes, and history of a gastrointestinal bleed, who had a hemoglobin of 11.1 three weeks ago.  Now she has a hemoglobin of 9.5 and heme-positive stool.  She also has some worrisome changes on her EKG.  1. Gastrointestinal bleed.  Will start IV Protonix and  stop aspirin.  She has    a history of gastritis.  Likely will need another complete GI workup if her    hemoglobin continues to drop.  Will hold most of the p.o. medication and    start on a sips and chips diet. 2. Electrocardiogram changes worrisome for silent myocardial infarction.  Will    need cardiology consult for re-evaluation of coronary artery status. 3. Diabetes.  Will hole Actos and Glyburide and place on sliding scale insulin    while on restricted diet. DD:  03/13/01 TD:  03/13/01 Job: [id number]

## 2011-04-27 NOTE — Op Note (Signed)
NAMEHANIN, DECOOK              ACCOUNT NO.:  [id number]   MEDICAL RECORD NO.:  [id number]          PATIENT TYPE:  AMB   LOCATION:  DAY                           FACILITY:  [facility]   PHYSICIAN:  Susanne Greenhouse, MD       DATE OF BIRTH:  03-04-1936   DATE OF PROCEDURE:  07/25/2006  DATE OF DISCHARGE:  07/25/2006                                 OPERATIVE REPORT   PREOPERATIVE DIAGNOSES:  Nuclear cataract, left eye.   POSTOPERATIVE DIAGNOSES:  Nuclear cataract, left eye.   OPERATION PERFORMED:  Phacoemulsification posterior chamber intraocular lens  implantation, left eye.   SURGEON:  Bonne Dolores. Hunt, MD.   ANESTHESIA:  Topical with monitored anesthesia care.   DESCRIPTION OF PROCEDURE:  In the preoperative holding area, dilating drops  and 2% viscus lidocaine were placed into the left eye. The patient was then  brought to the operating room where the eye was prepped and draped. A super  sharp blade was used to make a paracentesis port at the surgeon's 2 o'clock  position. The anterior chamber was then filled with 1% nonpreserved  lidocaine solution followed by Viscoat. A 2.85 mm Keratome blade was then  used to make a clear corneal incision at the supratemporal limbus. A  cystotome needle and Utrata forceps were used to create a continuous tear  capsulotomy. Hydrodissection was performed using balanced salt solution on a  fine cannula. The lens nucleus was then removed using phacoemulsification  and a quadrant cracking technique. Residual cortex is removed with  irrigation and aspiration. The capsular bag and anterior chamber were then  refilled with Duovisc and a posterior chamber intraocular lens was placed  into the capsular bag without difficulty using an [employer] lens  injecting system. The Duovisc was then removed from the capsular bag and  anterior chamber using irrigation and aspiration. Stromal hydration of the  main incision and paracentesis ports were performed using  balanced salt  solution on a fine cannula. The wound was tested for leak which was  negative. No sutures were placed. There were no operative complications, the  patient tolerated the procedure well and was returned to the recovery area  in satisfactory condition. The prosthetic device used is an Alcon AcrySof  posterior chamber intraocular lens model SN60WF, power of 21.5, serial  [id number].           ______________________________  Susanne Greenhouse, MD     KEH/MEDQ  D:  07/30/2006  T:  07/31/2006  Job:  [id number]

## 2011-04-27 NOTE — Discharge Summary (Signed)
[place]. [facility]  Patient:    Gwendolyn Wallace, SERVICE                     MRN: [id number] Adm. Date:  [id number] Disc. Date: [id number] Attending:  McDiarmid, Leighton Roach. Dictator:   Amparo Bristol, M.D. CC:         Wayne A. Sheffield Slider, M.D.  Dr. Arlyce Dice   Discharge Summary  DISCHARGE DIAGNOSES: 1. Gastrointestinal bleed. 2. Anemia, status post packed red blood cell infusion x 2. 3. Hypertension. 4. Coronary artery disease, status post coronary artery bypass graft. 5. Diabetes. 6. History of cerebrovascular accident. 7. History of gastritis.  DISCHARGE MEDICATIONS: 1. Iron 325 mg one tablet p.o. b.i.d. 2. Avandia q.d. 3. Aspirin 81 mg one p.o. q.d. 4. Ranitidine 150 mg p.o. q.d. 5. Glyburide 10 mg p.o. q.d. 6. Lotensin 40 mg 1 p.o. q.d. (new dose). 7. Metoprolol 100 mg 1/2 tablet p.o. b.i.d.  CONSULTATIONS:  Dr. Elsie Lincoln and Dr. Arlyce Dice.  PROCEDURES: 1. Normal colonoscopy. 2. Normal enteroscopy. 3. Normal small-bowel follow-through.  BRIEF HISTORY OF PRESENT ILLNESS:  The patient is a 75 year old patient who presented with a two-day history of feeling extremely weak and dizzy.  She had felt like this once before when she had had a GI bleed that was thought to be gastritis that was H. pylori negative.  Her hemoglobin three weeks ago had been 11.1.  PHYSICAL EXAMINATION:  Blood pressure 138/80, she had pale conjunctiva and her EKG showed new T-wave changes in leads 1 in AVL.  Her admission hemoglobin was 9.5 and she also had a heme-positive stool.  HOSPITAL COURSE: #1 - GASTROINTESTINAL BLEED:  GI was consulted and a full workup was completed.  Nothing definite was found, however, it is thought that the bleed was secondary to arteriovenous malformations.  She is to resume the iron and go home on the ranitidine.  It is okay to resume aspirin.  #2 - EKG CHANGES:  The patient had a CABG back in April 2000.  Cardiology was consulted and they thought that  she was stable for elective cardiology care. She will need a cardiology call schedule because she had some financial issues that needed to be worked out before returning to Dr. Reino Kent office.  She had no episodes of chest pain in the hospital.  #3 - HYPERTENSION:  Her blood pressures gradually crept up as her blood volume was replaced.  On the day of discharge, increased her Lotensin to 40 mg p.o. q.d.  Reluctant to start clonidine on this noncompliant patient.  #4 - DIABETES:  This was very well controlled in the hospital, covered on sliding scale insulin.  Discharge hemoglobin is 12.0.  FOLLOWUP:  The patient will need to follow up next week with family practice center for hemoglobin and blood pressure check.  DISCHARGE CONDITION:  Stable.  DISCHARGE DISPOSITION:  Discharged to home. DD:  03/16/01 TD:  03/16/01 Job: [id number]

## 2011-06-11 ENCOUNTER — Telehealth: Payer: Self-pay | Admitting: Family Medicine

## 2011-06-11 NOTE — Telephone Encounter (Signed)
Form from [facility] completed for testing 1-2 times daily and put in fax box.

## 2011-07-03 ENCOUNTER — Encounter: Payer: Self-pay | Admitting: Family Medicine

## 2011-07-03 ENCOUNTER — Ambulatory Visit (INDEPENDENT_AMBULATORY_CARE_PROVIDER_SITE_OTHER): Payer: Medicare Other | Admitting: Family Medicine

## 2011-07-03 VITALS — BP 118/70 | HR 68 | Wt 110.5 lb

## 2011-07-03 DIAGNOSIS — E118 Type 2 diabetes mellitus with unspecified complications: Secondary | ICD-10-CM

## 2011-07-03 DIAGNOSIS — L608 Other nail disorders: Secondary | ICD-10-CM

## 2011-07-03 DIAGNOSIS — B353 Tinea pedis: Secondary | ICD-10-CM

## 2011-07-03 DIAGNOSIS — D518 Other vitamin B12 deficiency anemias: Secondary | ICD-10-CM

## 2011-07-03 DIAGNOSIS — I10 Essential (primary) hypertension: Secondary | ICD-10-CM

## 2011-07-03 DIAGNOSIS — L309 Dermatitis, unspecified: Secondary | ICD-10-CM | POA: Insufficient documentation

## 2011-07-03 DIAGNOSIS — K3184 Gastroparesis: Secondary | ICD-10-CM

## 2011-07-03 DIAGNOSIS — F17200 Nicotine dependence, unspecified, uncomplicated: Secondary | ICD-10-CM

## 2011-07-03 DIAGNOSIS — IMO0002 Reserved for concepts with insufficient information to code with codable children: Secondary | ICD-10-CM

## 2011-07-03 DIAGNOSIS — E1165 Type 2 diabetes mellitus with hyperglycemia: Secondary | ICD-10-CM

## 2011-07-03 LAB — POCT GLYCOSYLATED HEMOGLOBIN (HGB A1C): Hemoglobin A1C: 7.2

## 2011-07-03 LAB — POCT SKIN KOH: Skin KOH, POC: NEGATIVE

## 2011-07-03 MED ORDER — INSULIN GLARGINE 100 UNIT/ML ~~LOC~~ SOLN: 10.0000 [IU] | Freq: Every day | SUBCUTANEOUS | Status: DC

## 2011-07-03 MED ORDER — TRIAMCINOLONE ACETONIDE 0.1 % EX OINT: TOPICAL_OINTMENT | Freq: Two times a day (BID) | CUTANEOUS | Status: DC

## 2011-07-03 NOTE — Patient Instructions (Addendum)
Recheck in 3 months, sooner if any problems, or if the foot rash doesn't improve  Please try to quit smoking again.

## 2011-07-04 ENCOUNTER — Encounter: Payer: Self-pay | Admitting: Family Medicine

## 2011-07-04 NOTE — Assessment & Plan Note (Signed)
She completed the Lamisil and believes that her nails are improving

## 2011-07-04 NOTE — Progress Notes (Signed)
  Subjective:    Patient ID: Gwendolyn Wallace, female    DOB: September 09, 1936, 75 y.o.   MRN: [id number]  HPI she is feeling stronger has been walking out in the yard more. Her landlord called and will control so that the neighbors dogs or not wondering into her yard anymore.  She denies calf pain with walking but not walking very far. Continue smoke but would like to stop.   He occasionally has had hypoglycemia symptoms she describes as palpitations sweating  & weakness. This responds to drinking orange juice. She is currently taking 10 units of Lantus each morning    Review of Systems  Constitutional: Positive for activity change.  Respiratory: Negative for apnea, cough and shortness of breath.   Cardiovascular: Negative for chest pain.  Neurological: Negative for dizziness.       Objective:   Physical Exam  Constitutional: She is oriented to person, place, and time.       Thin,but less frail appearing  Cardiovascular: Normal rate and regular rhythm.   Pulmonary/Chest: Effort normal and breath sounds normal.  Musculoskeletal: She exhibits no edema.  Neurological: She is alert and oriented to person, place, and time.       Mildly slurred speech Mildly unsteady in getting up onto table, but Romberg stable  Skin:       Great toenails appear normal but the fourth toe and of still appears to have fungal changes on the left  Psychiatric: She has a normal mood and affect. Her behavior is normal. Judgment and thought content normal.          Assessment & Plan:

## 2011-07-04 NOTE — Assessment & Plan Note (Signed)
Feels more steady on her feet. Continue oral replacement

## 2011-07-04 NOTE — Assessment & Plan Note (Signed)
Considering quitting. Was given the Quit line information.

## 2011-07-04 NOTE — Assessment & Plan Note (Signed)
well controlled  

## 2011-07-04 NOTE — Assessment & Plan Note (Signed)
Improved control. Will not push for A1c below 7 due 2 episodes of hypoglycemia

## 2011-07-04 NOTE — Assessment & Plan Note (Signed)
No apparent contact that she has been using alcohol in the skin and it appears to be eczematous rash. Speculum resolved with use of triamcinolone and she is to avoid alcohol or other irritants

## 2011-07-04 NOTE — Assessment & Plan Note (Signed)
Improved appetite taking the metoclopramide regularly

## 2011-07-31 ENCOUNTER — Other Ambulatory Visit: Payer: Self-pay | Admitting: Family Medicine

## 2011-07-31 DIAGNOSIS — E1165 Type 2 diabetes mellitus with hyperglycemia: Secondary | ICD-10-CM

## 2011-08-21 ENCOUNTER — Telehealth: Payer: Self-pay | Admitting: Family Medicine

## 2011-08-21 NOTE — Telephone Encounter (Signed)
Form from [facility] recycled due to my signing for [facility] 06/11/11

## 2011-08-29 ENCOUNTER — Other Ambulatory Visit: Payer: Self-pay | Admitting: Family Medicine

## 2011-09-01 NOTE — Progress Notes (Signed)
I called the patient who says she gets her supplies through physician "mutual"  Not [facility] so I faxed back their form denied.

## 2011-09-03 ENCOUNTER — Other Ambulatory Visit: Payer: Self-pay | Admitting: Family Medicine

## 2011-09-03 DIAGNOSIS — I10 Essential (primary) hypertension: Secondary | ICD-10-CM

## 2011-09-03 MED ORDER — METOPROLOL TARTRATE 50 MG PO TABS: 50.0000 mg | ORAL_TABLET | Freq: Two times a day (BID) | ORAL | Status: DC

## 2011-10-02 ENCOUNTER — Other Ambulatory Visit: Payer: Self-pay | Admitting: Family Medicine

## 2011-10-02 NOTE — Telephone Encounter (Signed)
Refill request

## 2011-10-05 ENCOUNTER — Other Ambulatory Visit: Payer: Self-pay | Admitting: Family Medicine

## 2011-10-05 MED ORDER — METFORMIN HCL 1000 MG PO TABS: ORAL_TABLET | ORAL | Status: DC

## 2011-10-08 ENCOUNTER — Encounter: Payer: Self-pay | Admitting: Family Medicine

## 2011-10-08 NOTE — Telephone Encounter (Signed)
error 

## 2011-10-08 NOTE — Telephone Encounter (Signed)
This encounter was created in error - please disregard.

## 2011-10-10 ENCOUNTER — Telehealth: Payer: Self-pay | Admitting: Family Medicine

## 2011-10-10 NOTE — Telephone Encounter (Signed)
I called and left a message on her machine that I can't fill out forms for diabetic supplies or shoes until she is seen in the clinic.

## 2011-10-11 ENCOUNTER — Encounter: Payer: Self-pay | Admitting: Family Medicine

## 2011-10-11 ENCOUNTER — Ambulatory Visit (INDEPENDENT_AMBULATORY_CARE_PROVIDER_SITE_OTHER): Payer: Medicare Other | Admitting: Family Medicine

## 2011-10-11 ENCOUNTER — Ambulatory Visit: Payer: Medicare Other | Admitting: Family Medicine

## 2011-10-11 VITALS — BP 172/76 | HR 67 | Temp 97.8°F | Ht 60.5 in | Wt 106.0 lb

## 2011-10-11 DIAGNOSIS — H9190 Unspecified hearing loss, unspecified ear: Secondary | ICD-10-CM

## 2011-10-11 DIAGNOSIS — Z23 Encounter for immunization: Secondary | ICD-10-CM

## 2011-10-11 DIAGNOSIS — I7389 Other specified peripheral vascular diseases: Secondary | ICD-10-CM

## 2011-10-11 DIAGNOSIS — E1165 Type 2 diabetes mellitus with hyperglycemia: Secondary | ICD-10-CM

## 2011-10-11 DIAGNOSIS — F17200 Nicotine dependence, unspecified, uncomplicated: Secondary | ICD-10-CM

## 2011-10-11 DIAGNOSIS — E118 Type 2 diabetes mellitus with unspecified complications: Secondary | ICD-10-CM

## 2011-10-11 DIAGNOSIS — R5381 Other malaise: Secondary | ICD-10-CM

## 2011-10-11 DIAGNOSIS — D649 Anemia, unspecified: Secondary | ICD-10-CM

## 2011-10-11 DIAGNOSIS — R5383 Other fatigue: Secondary | ICD-10-CM

## 2011-10-11 DIAGNOSIS — F329 Major depressive disorder, single episode, unspecified: Secondary | ICD-10-CM

## 2011-10-11 DIAGNOSIS — I10 Essential (primary) hypertension: Secondary | ICD-10-CM

## 2011-10-11 LAB — CBC
HCT: 43.7 % (ref 36.0–46.0)
Hemoglobin: 13.7 g/dL (ref 12.0–15.0)
MCV: 84.5 fL (ref 78.0–100.0)
WBC: 14 10*3/uL — ABNORMAL HIGH (ref 4.0–10.5)

## 2011-10-11 LAB — COMPREHENSIVE METABOLIC PANEL
Albumin: 4.5 g/dL (ref 3.5–5.2)
BUN: 16 mg/dL (ref 6–23)
CO2: 27 mEq/L (ref 19–32)
Calcium: 10.4 mg/dL (ref 8.4–10.5)
Chloride: 97 mEq/L (ref 96–112)
Creat: 0.84 mg/dL (ref 0.50–1.10)
Glucose, Bld: 155 mg/dL — ABNORMAL HIGH (ref 70–99)
Potassium: 4.3 mEq/L (ref 3.5–5.3)

## 2011-10-11 MED ORDER — CITALOPRAM HYDROBROMIDE 10 MG PO TABS: 10.0000 mg | ORAL_TABLET | Freq: Every day | ORAL | Status: DC

## 2011-10-11 NOTE — Assessment & Plan Note (Signed)
She is apparently exercising so little that doesn't have symptoms

## 2011-10-11 NOTE — Telephone Encounter (Signed)
Form mailed to: [facility] [address] [place], [place] [address]  Done 10/11/11

## 2011-10-11 NOTE — Patient Instructions (Signed)
It was good to see today I'm starting you on medication for your mood Quit smoking If you have any issues with your diabetic supplies please let me know Back in 3 months  Zachery Dauer MD

## 2011-10-11 NOTE — Assessment & Plan Note (Signed)
Still smoking is not ready to quit

## 2011-10-11 NOTE — Progress Notes (Signed)
  Subjective:    Patient ID: Gwendolyn Wallace, female    DOB: 12-Apr-1936, 75 y.o.   MRN: [id number]  HPI Pt is here for general medical and geriatric assessment:  DM: Overall well controlled. CBGs have been 120s-140s. Pt has had some low blood sugars into the 60s since last clinical visit.   Mood: Patient does report overall depressed mood since her husband died some years ago. No homicidal or suicidal ideations. Patient does report overall decreased fatigue as well as decreased energy to do her otherwise enjoyed activities. Patient states that she has a friend who comes to the house like like to take her out however she has no desire to go. Geriatric depression scale score 5.    Review of Systems     Objective:   Physical Exam Gen: up in chair, NAD HEENT: NCAT, EOMI, TMs clear bilaterally CV: RRR, no murmurs auscultated PULM: CTAB, no wheezes, rales, rhoncii ABD: S/NT/+ bowel sounds  EXT: 2+ peripheral pulses NEURO: CN II-XII grossly intact, bilateral feet monofilament WNL  Mini-Mobility:  Walking- stance, step symmetry, continuity, path deviation, trunk movements, turning, fast walking, sitting down all within normal limits Satnding-sit to stand, immediate standing balance, continue standing balance, Romberg, neck rotation all within normal limits; inability to do tandem stance and back lean    Assessment & Plan:

## 2011-10-11 NOTE — Assessment & Plan Note (Signed)
Geriatric depression scale mildly positive at 5. Patient does report overall decreased energy and interest in activities. Will start patient on low-dose Celexa for this. Will followup in 3 months

## 2011-10-11 NOTE — Progress Notes (Signed)
  Subjective:    Patient ID: Gwendolyn Wallace, female    DOB: 07-24-1936, 75 y.o.   MRN: [id number]  HPI regarding the depressions she's felt since her husband died, she is sleeping well. Has a gentleman friend but doesn't go out with him. She's thinks it would be healthier for her she did go out and socialize some.   She feels tired all over whenever she she walks. Not getting pain in her calves with walking or during the night. Good sensation in her feet and she feels steadier walking.     Review of Systems     Objective:   Physical ExamTandem stance is unsteady Affect is flat for her. Speech is mildly slurred as it has been since her stroke        Assessment & Plan:

## 2011-10-12 ENCOUNTER — Encounter: Payer: Self-pay | Admitting: Family Medicine

## 2011-11-15 ENCOUNTER — Other Ambulatory Visit: Payer: Self-pay | Admitting: Family Medicine

## 2011-11-15 DIAGNOSIS — I10 Essential (primary) hypertension: Secondary | ICD-10-CM

## 2011-11-15 NOTE — Telephone Encounter (Signed)
Refill request

## 2011-11-28 ENCOUNTER — Telehealth: Payer: Self-pay | Admitting: Family Medicine

## 2011-11-28 NOTE — Telephone Encounter (Signed)
Form signed to fax back to [facility] for Germaine Pomfret style diabetic shoes with 3 pairs of inserts.

## 2011-12-15 ENCOUNTER — Other Ambulatory Visit: Payer: Self-pay | Admitting: Family Medicine

## 2011-12-15 DIAGNOSIS — I10 Essential (primary) hypertension: Secondary | ICD-10-CM

## 2011-12-15 NOTE — Telephone Encounter (Signed)
Refill request

## 2012-01-01 ENCOUNTER — Encounter: Payer: Self-pay | Admitting: Family Medicine

## 2012-01-01 DIAGNOSIS — E118 Type 2 diabetes mellitus with unspecified complications: Secondary | ICD-10-CM

## 2012-02-27 ENCOUNTER — Encounter: Payer: Self-pay | Admitting: Family Medicine

## 2012-03-10 ENCOUNTER — Other Ambulatory Visit: Payer: Self-pay | Admitting: Family Medicine

## 2012-03-10 DIAGNOSIS — Z139 Encounter for screening, unspecified: Secondary | ICD-10-CM

## 2012-03-11 ENCOUNTER — Other Ambulatory Visit: Payer: Self-pay | Admitting: Family Medicine

## 2012-03-11 DIAGNOSIS — E118 Type 2 diabetes mellitus with unspecified complications: Secondary | ICD-10-CM

## 2012-03-11 MED ORDER — INSULIN GLARGINE 100 UNIT/ML ~~LOC~~ SOLN: 10.0000 [IU] | Freq: Every day | SUBCUTANEOUS | Status: DC

## 2012-03-11 NOTE — Telephone Encounter (Signed)
She is overdue for a follow up visit. No further refills until seen

## 2012-03-11 NOTE — Telephone Encounter (Signed)
Patient is calling needing refills on her Diabetes medication.  The [facility] is waiting to hear back from Dr. Sheffield Slider to be able to fill it.  She was not able to tell me the specific Diabetic medication she needs.

## 2012-03-11 NOTE — Telephone Encounter (Signed)
Fwd. To Dr.Hale for review. .Gwendolyn Wallace  

## 2012-03-11 NOTE — Telephone Encounter (Signed)
One month of Lantus eprescribed based on fax request from her pharmacy. Please contact her to schedule her for her overdue appt.

## 2012-03-12 NOTE — Telephone Encounter (Signed)
Called patient and informed of Rx sent to pharmacy. Also scheduled an appointment for her in [place] [facility] on 03/20/12 with Dr Sheffield Slider.Gwendolyn Wallace, Gwendolyn Wallace

## 2012-03-13 ENCOUNTER — Encounter: Payer: Self-pay | Admitting: Family Medicine

## 2012-03-13 ENCOUNTER — Ambulatory Visit (INDEPENDENT_AMBULATORY_CARE_PROVIDER_SITE_OTHER): Payer: Medicare Other | Admitting: Family Medicine

## 2012-03-13 VITALS — BP 174/72 | HR 62 | Temp 97.0°F | Ht 60.5 in | Wt 105.2 lb

## 2012-03-13 DIAGNOSIS — IMO0002 Reserved for concepts with insufficient information to code with codable children: Secondary | ICD-10-CM

## 2012-03-13 DIAGNOSIS — I10 Essential (primary) hypertension: Secondary | ICD-10-CM

## 2012-03-13 DIAGNOSIS — E559 Vitamin D deficiency, unspecified: Secondary | ICD-10-CM | POA: Insufficient documentation

## 2012-03-13 DIAGNOSIS — I6529 Occlusion and stenosis of unspecified carotid artery: Secondary | ICD-10-CM

## 2012-03-13 DIAGNOSIS — D518 Other vitamin B12 deficiency anemias: Secondary | ICD-10-CM

## 2012-03-13 DIAGNOSIS — E785 Hyperlipidemia, unspecified: Secondary | ICD-10-CM

## 2012-03-13 DIAGNOSIS — L608 Other nail disorders: Secondary | ICD-10-CM

## 2012-03-13 DIAGNOSIS — M810 Age-related osteoporosis without current pathological fracture: Secondary | ICD-10-CM

## 2012-03-13 DIAGNOSIS — E118 Type 2 diabetes mellitus with unspecified complications: Secondary | ICD-10-CM

## 2012-03-13 DIAGNOSIS — M899 Disorder of bone, unspecified: Secondary | ICD-10-CM | POA: Insufficient documentation

## 2012-03-13 LAB — CBC
Hemoglobin: 13.4 g/dL (ref 12.0–15.0)
MCV: 84.2 fL (ref 78.0–100.0)
Platelets: 370 10*3/uL (ref 150–400)
RBC: 4.99 MIL/uL (ref 3.87–5.11)
WBC: 13.9 10*3/uL — ABNORMAL HIGH (ref 4.0–10.5)

## 2012-03-13 LAB — VITAMIN B12: Vitamin B-12: 1120 pg/mL — ABNORMAL HIGH (ref 211–911)

## 2012-03-13 LAB — BASIC METABOLIC PANEL
CO2: 28 mEq/L (ref 19–32)
Chloride: 98 mEq/L (ref 96–112)
Creat: 0.82 mg/dL (ref 0.50–1.10)
Glucose, Bld: 199 mg/dL — ABNORMAL HIGH (ref 70–99)

## 2012-03-13 LAB — POCT GLYCOSYLATED HEMOGLOBIN (HGB A1C): Hemoglobin A1C: 7.6

## 2012-03-13 MED ORDER — INSULIN GLARGINE 100 UNIT/ML ~~LOC~~ SOLN: 8.0000 [IU] | Freq: Every day | SUBCUTANEOUS | Status: DC

## 2012-03-13 NOTE — Assessment & Plan Note (Signed)
Like last visit she comes in without having taken her medications. She doesn't have a cuff at home. She is to come next visit after having taken her medications.

## 2012-03-13 NOTE — Assessment & Plan Note (Signed)
Will recheck to make sure that the stenoses are not progressing

## 2012-03-13 NOTE — Assessment & Plan Note (Signed)
Tinea now evident between toes. Will prescribe antifungal

## 2012-03-13 NOTE — Progress Notes (Signed)
  Subjective:    Patient ID: Gwendolyn Wallace, female    DOB: [birth year], 76 y.o.   MRN: [id number]  [facility] been feeling well, except for generalized weakness that she attributes to poor appetite. She only takes the metoclopramide occasionally before meals. She only takes Tums for heartburn which is most days. Fried chicken causes nausea and vomiting, but not abdominal pain. She can eat steak without problems, but doesn't feel good after eating chicken.   She recalls a cbg of 130 fasting. Lower when she checks in the evening. No recent hypoglycemia episodes on 7 units of Lantus each AM. She lowered the Metformin dose to 1000 mg bid due to difficulty breaking the pills in half.  Uses a True Read glucometer and knows only that the supplier is a company of [place]. She constantly gets calls from others wanting to send her testing supplies. She has plenty of test strips.   She denies recent falls. Only walks short distances in the yard and hasn't had recent calf claudication.   No chest pain or TIA symptoms.   She has itching of the medial dorsum of her left forefoot.   Review of Systems  Constitutional: Positive for appetite change. Negative for activity change, fatigue and unexpected weight change.  Respiratory: Negative for cough, chest tightness and shortness of breath.   Cardiovascular: Negative for chest pain and leg swelling.  Gastrointestinal: Positive for nausea and vomiting. Negative for abdominal pain and constipation.  Musculoskeletal: Negative for gait problem.  Skin: Positive for rash.       Objective:   Physical Exam  Constitutional:       Thin, but well appearing  Neck: Normal range of motion.  Cardiovascular: Normal rate, regular rhythm and normal heart sounds.        Grade 1/6 SEM aortic area Carotids - bilateral bruits Decreased pedal pulses with fair capillary refill  Pulmonary/Chest: Effort normal and breath sounds normal. She has no rales.  Abdominal: Soft. She  exhibits no distension and no mass. There is no tenderness. There is no guarding.  Musculoskeletal: She exhibits no edema.  Neurological: She is alert. No cranial nerve deficit.       Slightly slurred speech remains  Skin: Skin is warm. Rash noted.       Superficial erythema and tiny blisters dorsum of right medial forefoot and between digits 2-3. Thickening of toenails suggestive of tinea with horseshoe deformity, but no inflammation from ingrowing.   Psychiatric: She has a normal mood and affect. Her behavior is normal. Judgment and thought content normal.      Balance Abnormal Patient value  Sitting balance    Arise x Used arms  Attempts to arise    Immediate standing balance    Standing balance    Nudge    Eyes closed x Can't do tandem stance  360 degree turn    Sitting down x Flops down   Gait Abnormal Patient value  Initiation of gait    Step length-left    Step length-right    Step height-left    Step height-right    Step symmetry    Step continuity    Path x   Trunk    Walking stance x Mildly wide-based          Assessment & Plan:

## 2012-03-13 NOTE — Assessment & Plan Note (Signed)
Close to A1c goal. She will increase her Lantus to 8 units daily

## 2012-03-13 NOTE — Assessment & Plan Note (Signed)
Check fasting lipids. Change to Atorvastatin if LDL over 70

## 2012-03-13 NOTE — Assessment & Plan Note (Signed)
She continues mild instability which may relate to her stroke. Check level to make sure oral replacement is adequate

## 2012-03-13 NOTE — Patient Instructions (Signed)
Please return to see Dr Sheffield Slider in 1 month. Please bring blood pressure readings from when you are taking your medications.   I will let you know the results of your blood tests.   Work up to standing and sitting 8 times in a row  Get your bone density check when they do your mammogram  Take the metoclopramide 5 mg before most meals to increase your appetite.  Increase your Lantus to 8 units each morning. Your A1c was 7.6 and goal is to be under 7.5

## 2012-03-13 NOTE — Assessment & Plan Note (Signed)
Due to weakness and low dose of Vitamin D will recheck level

## 2012-03-14 ENCOUNTER — Encounter: Payer: Self-pay | Admitting: Family Medicine

## 2012-03-20 ENCOUNTER — Ambulatory Visit: Payer: Medicare Other

## 2012-04-01 ENCOUNTER — Telehealth: Payer: Self-pay | Admitting: Family Medicine

## 2012-04-01 NOTE — Telephone Encounter (Signed)
Has the paper work for Diabetic Supplies been received.  If so, it needs to be faxed to [phone].

## 2012-04-01 NOTE — Telephone Encounter (Signed)
I am getting them from 5 different companies, meanwhile the patient tells me she has plenty. I'll respond when the patient says she needs them and tells me which company to use.

## 2012-04-01 NOTE — Telephone Encounter (Signed)
Will fwd. To Dr.Hale.

## 2012-04-17 ENCOUNTER — Ambulatory Visit (INDEPENDENT_AMBULATORY_CARE_PROVIDER_SITE_OTHER): Payer: Medicare Other | Admitting: Family Medicine

## 2012-04-17 ENCOUNTER — Encounter: Payer: Self-pay | Admitting: Family Medicine

## 2012-04-17 VITALS — BP 103/64 | HR 59 | Ht 60.0 in | Wt 106.0 lb

## 2012-04-17 DIAGNOSIS — F17200 Nicotine dependence, unspecified, uncomplicated: Secondary | ICD-10-CM

## 2012-04-17 DIAGNOSIS — R269 Unspecified abnormalities of gait and mobility: Secondary | ICD-10-CM

## 2012-04-17 DIAGNOSIS — I6529 Occlusion and stenosis of unspecified carotid artery: Secondary | ICD-10-CM

## 2012-04-17 DIAGNOSIS — E785 Hyperlipidemia, unspecified: Secondary | ICD-10-CM

## 2012-04-17 MED ORDER — NICOTINE POLACRILEX 2 MG MT GUM: 2.0000 mg | CHEWING_GUM | OROMUCOSAL | Status: AC | PRN

## 2012-04-17 NOTE — Assessment & Plan Note (Signed)
She continues with fall risk due to mild incoordination in her right leg from the remote CVA and bilateral hip extensor weakness.

## 2012-04-17 NOTE — Progress Notes (Signed)
Gwendolyn Wallace is a 76 y.o. female who presents to [facility] today for follow up of:  1) HTN: Currently taking medications listed below. No syncope, CP, SOB, edema noted. Feels well otherwise.   2) DM: Currently taking metformin 1000mg  bid and Lantus 8 units daily. Notes that average blood sugar is <150 and >100. Minimal blood sugar was 66 patient felt symptomatic and took orange juice.  Maximum blood sugar was 220.  Feels well without polyuria or polydipsia.  3) smoking: Patient smokes 5-8 cigarettes a day and desires to quit,  is interested in nicotine gum.   PMH: Reviewed significant for vascular disease throughout including history of stroke and coronary artery disease History  Substance Use Topics  . Smoking status: Current Everyday Smoker -- 0.2 packs/day    Types: Cigarettes  . Smokeless tobacco: Not on file  . Alcohol Use: No   ROS as above  Medications reviewed. Current Outpatient Prescriptions  Medication Sig Dispense Refill  . amLODipine (NORVASC) 10 MG tablet TAKE ONE TABLET ONCE DAILY  31 tablet  11  . aspirin (BAYER CHILDRENS ASPIRIN) 81 MG chewable tablet Chew 81 mg by mouth daily.        . calcium carbonate (TUMS E-X 750) 750 MG chewable tablet Take 1 two times a day       . cyanocobalamin 2000 MCG tablet Take 2,000 mcg by mouth daily.        Marland Kitchen glucose blood (ACCU-CHEK COMFORT CURVE) test strip Test twice daily       . insulin glargine (LANTUS SOLOSTAR) 100 UNIT/ML injection Inject 8 Units into the skin daily.  1 mL  1  . Insulin Pen Needle 29G X 12.7MM MISC Use as directed       . lisinopril-hydrochlorothiazide (PRINZIDE,ZESTORETIC) 20-12.5 MG per tablet TAKE ONE TABLET BY MOUTH TWICE A DAY  62 tablet  11  . metFORMIN (GLUCOPHAGE) 1000 MG tablet Take 1,000 mg by mouth 2 (two) times daily with a meal. Take 1.5 tablet in the morning and 1 tab at supper      . metoprolol (LOPRESSOR) 50 MG tablet Take 1 tablet (50 mg total) by mouth 2 (two) times daily.  60 tablet  11  . Misc.  Devices (ADJUSTABLE ALUMINUM CANE 5/8") MISC        . Multiple Vitamin (MULTIVITAMIN) capsule Take 1 capsule by mouth daily.        . simvastatin (ZOCOR) 40 MG tablet Take 40 mg by mouth at bedtime.        . citalopram (CELEXA) 10 MG tablet Take 1 tablet (10 mg total) by mouth daily.  30 tablet  2  . metoclopramide (REGLAN) 5 MG tablet Take 5 mg by mouth 4 (four) times daily as needed. Before meals and at bedtime      . nitroGLYCERIN (NITROSTAT) 0.4 MG SL tablet Place 1 tablet under tongue as directed       . omeprazole (PRILOSEC) 20 MG capsule Take 1 capsule by mouth two times a day         Exam:  BP 103/64  Pulse 59  Ht 5' (1.524 m)  Wt 106 lb (48.081 kg)  BMI 20.70 kg/m2 Gen: Well , thin elderly woman in no acute distress HEENT: EOMI,  MMM, carotid bruit present bilaterally Lungs: CTABL Nl WOB Heart: RRR no MRG Abd: NABS, NT, ND Exts: Non edematous BL  LE, warm and well perfused.  Gait: Slightly asymmetric, however relatively normal. The patient drifts to the left  a bit. Less coordinated placement of right leg She is unable to rise from a seated position without using her hands. With holding my hands she could arise 3 reps She was unable to do tandem stance Romberg. Regular Romberg is normal  No results found for this or any previous visit (from the past 72 hour(s)).

## 2012-04-17 NOTE — Patient Instructions (Addendum)
Please return to see Dr Sheffield Slider in 2 months.  Good luck quitting smoking! It's the best thing you can do to prevent future problems with your blood vessels.  Do your standing up exercises daily building up to 8 repetitions  Called after the visit and left message to schedule a fasting lipid profile.  She had been given hemacult cards.

## 2012-04-18 ENCOUNTER — Telehealth: Payer: Self-pay | Admitting: Family Medicine

## 2012-04-18 NOTE — Telephone Encounter (Signed)
Pt can't find Tdap shot in [place] without having to buy a whole packet of them.  Wants to know what to do.

## 2012-04-18 NOTE — Telephone Encounter (Signed)
Advised patient that we are working on this  and will let her know when we have a plan in place . I called two pharmacies and was told that if they order tdap they have to order a box of 10. Advised patient to call us back.

## 2012-04-21 ENCOUNTER — Ambulatory Visit (HOSPITAL_COMMUNITY)
Admission: RE | Admit: 2012-04-21 | Discharge: 2012-04-21 | Disposition: A | Discharge status: Home / Self Care | Payer: Medicare Other | Source: Ambulatory Visit | Attending: Family Medicine | Admitting: Family Medicine

## 2012-04-21 DIAGNOSIS — Z1231 Encounter for screening mammogram for malignant neoplasm of breast: Secondary | ICD-10-CM | POA: Insufficient documentation

## 2012-04-21 DIAGNOSIS — Z139 Encounter for screening, unspecified: Secondary | ICD-10-CM

## 2012-04-24 ENCOUNTER — Other Ambulatory Visit: Payer: Self-pay | Admitting: Family Medicine

## 2012-04-24 ENCOUNTER — Other Ambulatory Visit: Payer: Medicare Other

## 2012-04-28 ENCOUNTER — Other Ambulatory Visit: Payer: Self-pay | Admitting: Family Medicine

## 2012-05-08 ENCOUNTER — Other Ambulatory Visit: Payer: Medicare Other

## 2012-05-09 ENCOUNTER — Other Ambulatory Visit: Payer: Medicare Other

## 2012-05-09 DIAGNOSIS — E785 Hyperlipidemia, unspecified: Secondary | ICD-10-CM

## 2012-05-09 LAB — LIPID PANEL
Cholesterol: 236 mg/dL — ABNORMAL HIGH (ref 0–200)
Triglycerides: 157 mg/dL — ABNORMAL HIGH (ref <150)
VLDL: 31 mg/dL (ref 0–40)

## 2012-05-09 NOTE — Progress Notes (Signed)
FLP DONE TODAY Quincey Nored 

## 2012-05-12 ENCOUNTER — Telehealth: Payer: Self-pay | Admitting: Family Medicine

## 2012-05-12 DIAGNOSIS — E1165 Type 2 diabetes mellitus with hyperglycemia: Secondary | ICD-10-CM

## 2012-05-12 NOTE — Telephone Encounter (Signed)
I called Gwendolyn Wallace who admits to not taking Simvastatin regularly. When given her high LDL results, she committed to taking it nightly.  She says she uses True Test meter strips.

## 2012-06-03 ENCOUNTER — Telehealth: Payer: Self-pay | Admitting: Family Medicine

## 2012-06-03 NOTE — Telephone Encounter (Signed)
Pharmacy is calling because they have received the Rx for the Lancets, but they still need the Rx for the Test Strips.  [employer] Meter.

## 2012-06-03 NOTE — Telephone Encounter (Signed)
Fwd. To Dr.Hale .Kennard Fildes  

## 2012-06-06 ENCOUNTER — Other Ambulatory Visit: Payer: Self-pay | Admitting: Family Medicine

## 2012-06-06 DIAGNOSIS — E1165 Type 2 diabetes mellitus with hyperglycemia: Secondary | ICD-10-CM

## 2012-06-06 NOTE — Telephone Encounter (Signed)
Pharmacy received Rx for lancets, but not test strips.  Gave verbal order to refill test strips with one year refill (same as lancets that was refilled earlier today by Dr. Sheffield Slider).  Pharmacy will mail supplies to patient today.  Gaylene Brooks, RN

## 2012-06-09 ENCOUNTER — Other Ambulatory Visit: Payer: Self-pay | Admitting: Family Medicine

## 2012-06-10 NOTE — Telephone Encounter (Signed)
Has this been addressed?

## 2012-06-10 NOTE — Telephone Encounter (Signed)
I will only respond to these requests if the patient tells me they are needed since I have multiple suppliers requesting that I sign for these supplies.

## 2012-06-10 NOTE — Telephone Encounter (Signed)
Fwd. To Dr.Hale 

## 2012-06-11 NOTE — Telephone Encounter (Signed)
I'm not responding to these because I am receiving requests from multiple companies. Patient has told me that she has plenty of supplies

## 2012-08-26 ENCOUNTER — Ambulatory Visit (INDEPENDENT_AMBULATORY_CARE_PROVIDER_SITE_OTHER): Payer: Medicare Other | Admitting: Family Medicine

## 2012-08-26 ENCOUNTER — Encounter: Payer: Self-pay | Admitting: Family Medicine

## 2012-08-26 VITALS — BP 128/73 | HR 56 | Ht 60.5 in | Wt 107.2 lb

## 2012-08-26 DIAGNOSIS — F17200 Nicotine dependence, unspecified, uncomplicated: Secondary | ICD-10-CM

## 2012-08-26 DIAGNOSIS — I2510 Atherosclerotic heart disease of native coronary artery without angina pectoris: Secondary | ICD-10-CM

## 2012-08-26 DIAGNOSIS — IMO0002 Reserved for concepts with insufficient information to code with codable children: Secondary | ICD-10-CM

## 2012-08-26 DIAGNOSIS — E785 Hyperlipidemia, unspecified: Secondary | ICD-10-CM

## 2012-08-26 DIAGNOSIS — E118 Type 2 diabetes mellitus with unspecified complications: Secondary | ICD-10-CM

## 2012-08-26 DIAGNOSIS — Z23 Encounter for immunization: Secondary | ICD-10-CM

## 2012-08-26 DIAGNOSIS — L608 Other nail disorders: Secondary | ICD-10-CM

## 2012-08-26 DIAGNOSIS — E1165 Type 2 diabetes mellitus with hyperglycemia: Secondary | ICD-10-CM

## 2012-08-26 DIAGNOSIS — M19042 Primary osteoarthritis, left hand: Secondary | ICD-10-CM

## 2012-08-26 DIAGNOSIS — M19049 Primary osteoarthritis, unspecified hand: Secondary | ICD-10-CM

## 2012-08-26 LAB — COMPREHENSIVE METABOLIC PANEL
ALT: <8 U/L (ref 0–35)
Albumin: 4.4 g/dL (ref 3.5–5.2)
CO2: 27 mEq/L (ref 19–32)
Calcium: 9.7 mg/dL (ref 8.4–10.5)
Chloride: 102 mEq/L (ref 96–112)
Glucose, Bld: 144 mg/dL — ABNORMAL HIGH (ref 70–99)
Potassium: 4.4 mEq/L (ref 3.5–5.3)
Sodium: 138 mEq/L (ref 135–145)
Total Bilirubin: 0.4 mg/dL (ref 0.3–1.2)
Total Protein: 7.1 g/dL (ref 6.0–8.3)

## 2012-08-26 LAB — LDL CHOLESTEROL, DIRECT: Direct LDL: 116 mg/dL — ABNORMAL HIGH

## 2012-08-26 LAB — URIC ACID: Uric Acid, Serum: 8 mg/dL — ABNORMAL HIGH (ref 2.4–7.0)

## 2012-08-26 MED ORDER — ATORVASTATIN CALCIUM 40 MG PO TABS: 40.0000 mg | ORAL_TABLET | Freq: Every day | ORAL | Status: DC

## 2012-08-26 NOTE — Assessment & Plan Note (Signed)
Again strongly advised to quit smoking

## 2012-08-26 NOTE — Assessment & Plan Note (Signed)
Adequately controlled without recent hypoglycemia

## 2012-08-26 NOTE — Assessment & Plan Note (Signed)
Not near goal on Simvastatin so will replace with Atorvastatin

## 2012-08-26 NOTE — Assessment & Plan Note (Signed)
Likely DJD, but uric acid of 8.0 suggests there may be a gouty arthropathy component.

## 2012-08-26 NOTE — Patient Instructions (Addendum)
Please return to see Dr Sheffield Slider in 3months.  Take Ibuprofen 600 mg one tab 3 times daily with meals for one week. Take with Omeprazole if your stomach is irritated by it Routinely take Acetaminophen extra strength every 8 hours for arthritis  Please plan to set a date to quit smoking  Smoking Cessation This document explains the best ways for you to quit smoking and new treatments to help. It lists new medicines that can double or triple your chances of quitting and quitting for good. It also considers ways to avoid relapses and concerns you may have about quitting, including weight gain. NICOTINE: A POWERFUL ADDICTION If you have tried to quit smoking, you know how hard it can be. It is hard because nicotine is a very addictive drug. For some people, it can be as addictive as heroin or cocaine. Usually, people make 2 or 3 tries, or more, before finally being able to quit. Each time you try to quit, you can learn about what helps and what hurts. Quitting takes hard work and a lot of effort, but you can quit smoking. QUITTING SMOKING IS ONE OF THE MOST IMPORTANT THINGS YOU WILL EVER DO.  You will live longer, feel better, and live better.   The impact on your body of quitting smoking is felt almost immediately:   Within 20 minutes, blood pressure decreases. Pulse returns to its normal level.   After 8 hours, carbon monoxide levels in the blood return to normal. Oxygen level increases.   After 24 hours, chance of heart attack starts to decrease. Breath, hair, and body stop smelling like smoke.   After 48 hours, damaged nerve endings begin to recover. Sense of taste and smell improve.   After 72 hours, the body is virtually free of nicotine. Bronchial tubes relax and breathing becomes easier.   After 2 to 12 weeks, lungs can hold more air. Exercise becomes easier and circulation improves.   Quitting will reduce your risk of having a heart attack, stroke, cancer, or lung disease:   After 1  year, the risk of coronary heart disease is cut in half.   After 5 years, the risk of stroke falls to the same as a nonsmoker.   After 10 years, the risk of lung cancer is cut in half and the risk of other cancers decreases significantly.   After 15 years, the risk of coronary heart disease drops, usually to the level of a nonsmoker.   If you are pregnant, quitting smoking will improve your chances of having a healthy baby.   The people you live with, especially your children, will be healthier.   You will have extra money to spend on things other than cigarettes.  FIVE KEYS TO QUITTING Studies have shown that these 5 steps will help you quit smoking and quit for good. You have the best chances of quitting if you use them together: 1. Get ready.  2. Get support and encouragement.  3. Learn new skills and behaviors.  4. Get medicine to reduce your nicotine addiction and use it correctly.  5. Be prepared for relapse or difficult situations. Be determined to continue trying to quit, even if you do not succeed at first.  1. GET READY  Set a quit date.   Change your environment.   Get rid of ALL cigarettes, ashtrays, matches, and lighters in your home, car, and place of work.   Do not let people smoke in your home.   Review your past  attempts to quit. Think about what worked and what did not.   Once you quit, do not smoke. NOT EVEN A PUFF!  2. GET SUPPORT AND ENCOURAGEMENT Studies have shown that you have a better chance of being successful if you have help. You can get support in many ways.  Tell your family, friends, and coworkers that you are going to quit and need their support. Ask them not to smoke around you.   Talk to your caregivers (doctor, dentist, nurse, pharmacist, psychologist, and/or smoking counselor).   Get individual, group, or telephone counseling and support. The more counseling you have, the better your chances are of quitting. Programs are available at [employer] and health centers. Call your local health department for information about programs in your area.   Spiritual beliefs and practices may help some smokers quit.   Quit meters are Photographer that keep track of quit statistics, such as amount of "quit-time," cigarettes not smoked, and money saved.   Many smokers find one or more of the many self-help books available useful in helping them quit and stay off tobacco.  3. LEARN NEW SKILLS AND BEHAVIORS  Try to distract yourself from urges to smoke. Talk to someone, go for a walk, or occupy your time with a task.   When you first try to quit, change your routine. Take a different route to work. Drink tea instead of coffee. Eat breakfast in a different place.   Do something to reduce your stress. Take a hot bath, exercise, or read a book.   Plan something enjoyable to do every day. Reward yourself for not smoking.   Explore interactive web-based programs that specialize in helping you quit.  4. GET MEDICINE AND USE IT CORRECTLY Medicines can help you stop smoking and decrease the urge to smoke. Combining medicine with the above behavioral methods and support can quadruple your chances of successfully quitting smoking. The U.S. Food and Drug Administration (FDA) has approved 7 medicines to help you quit smoking. These medicines fall into 3 categories.  Nicotine replacement therapy (delivers nicotine to your body without the negative effects and risks of smoking):   Nicotine gum: Available over-the-counter.   Nicotine lozenges: Available over-the-counter.   Nicotine inhaler: Available by prescription.   Nicotine nasal spray: Available by prescription.   Nicotine skin patches (transdermal): Available by prescription and over-the-counter.   Antidepressant medicine (helps people abstain from smoking, but how this works is unknown):   Bupropion sustained-release (SR) tablets: Available by  prescription.   Nicotinic receptor partial agonist (simulates the effect of nicotine in your brain):   Varenicline tartrate tablets: Available by prescription.   Ask your caregiver for advice about which medicines to use and how to use them. Carefully read the information on the package.   Everyone who is trying to quit may benefit from using a medicine. If you are pregnant or trying to become pregnant, nursing an infant, you are under age 34, or you smoke fewer than 10 cigarettes per day, talk to your caregiver before taking any nicotine replacement medicines.   You should stop using a nicotine replacement product and call your caregiver if you experience nausea, dizziness, weakness, vomiting, fast or irregular heartbeat, mouth problems with the lozenge or gum, or redness or swelling of the skin around the patch that does not go away.   Do not use any other product containing nicotine while using a nicotine replacement product.   Talk to your  caregiver before using these products if you have diabetes, heart disease, asthma, stomach ulcers, you had a recent heart attack, you have high blood pressure that is not controlled with medicine, a history of irregular heartbeat, or you have been prescribed medicine to help you quit smoking.  5. BE PREPARED FOR RELAPSE OR DIFFICULT SITUATIONS  Most relapses occur within the first 3 months after quitting. Do not be discouraged if you start smoking again. Remember, most people try several times before they finally quit.   You may have symptoms of withdrawal because your body is used to nicotine. You may crave cigarettes, be irritable, feel very hungry, cough often, get headaches, or have difficulty concentrating.   The withdrawal symptoms are only temporary. They are strongest when you first quit, but they will go away within 10 to 14 days.  Here are some difficult situations to watch for:  Alcohol. Avoid drinking alcohol. Drinking lowers your chances of  successfully quitting.   Caffeine. Try to reduce the amount of caffeine you consume. It also lowers your chances of successfully quitting.   Other smokers. Being around smoking can make you want to smoke. Avoid smokers.   Weight gain. Many smokers will gain weight when they quit, usually less than 10 pounds. Eat a healthy diet and stay active. Do not let weight gain distract you from your main goal, quitting smoking. Some medicines that help you quit smoking may also help delay weight gain. You can always lose the weight gained after you quit.   Bad mood or depression. There are a lot of ways to improve your mood other than smoking.  If you are having problems with any of these situations, talk to your caregiver. SPECIAL SITUATIONS AND CONDITIONS Studies suggest that everyone can quit smoking. Your situation or condition can give you a special reason to quit.  Pregnant women/new mothers: By quitting, you protect your baby's health and your own.   Hospitalized patients: By quitting, you reduce health problems and help healing.   Heart attack patients: By quitting, you reduce your risk of a second heart attack.   Lung, head, and neck cancer patients: By quitting, you reduce your chance of a second cancer.   Parents of children and adolescents: By quitting, you protect your children from illnesses caused by secondhand smoke.  QUESTIONS TO THINK ABOUT Think about the following questions before you try to stop smoking. You may want to talk about your answers with your caregiver.  Why do you want to quit?   If you tried to quit in the past, what helped and what did not?   What will be the most difficult situations for you after you quit? How will you plan to handle them?   Who can help you through the tough times? Your family? Friends? Caregiver?   What pleasures do you get from smoking? What ways can you still get pleasure if you quit?  Here are some questions to ask your  caregiver:  How can you help me to be successful at quitting?   What medicine do you think would be best for me and how should I take it?   What should I do if I need more help?   What is smoking withdrawal like? How can I get information on withdrawal?  Quitting takes hard work and a lot of effort, but you can quit smoking. FOR MORE INFORMATION  Smokefree.gov (http://www.davis-sullivan.com/) provides free, accurate, evidence-based information and professional assistance to help support the immediate and  long-term needs of people trying to quit smoking. Document Released: 11/20/2001 Document Revised: 11/15/2011 Document Reviewed: 09/12/2009 [facility] Patient Information 2012 [place], [place].

## 2012-08-26 NOTE — Progress Notes (Signed)
  Subjective:    Patient ID: Gwendolyn Wallace, female    DOB: Nov 12, 1936, 76 y.o.   MRN: [id number]  HPI DM - Thinks that she is using a True Result glucometer. Fasting blood glucoses have been lower.   Cut her left great toe recently with a knife she was using to remove an ingrowing part medially. Has been applying an antibiotic ointment. Hasn't seen her podiatrist recently, since she can't drive.   Smoking 4-5 cigarettes daily. Considering quitting, but not ready to set a date.   Claudication - no rest pain. Not walking much, so not getting pains in calves.  Cholesterol - Says she is taking her Simvastatin 40 mg regularly.   Arthritis - her fingers are hurting the past year. Mainly the PIP of 3rd and 4th fingers, greater on the left. Makes it hard to grasp things. Hasn't ever had gout to her knowledge, but her son has.    Review of Systems     Objective:   Physical Exam  Constitutional:       Thin as usual  Cardiovascular: Normal rate and regular rhythm.   Murmur heard.      Gr 2/6 SEM  Pulmonary/Chest: Effort normal and breath sounds normal. No respiratory distress. She has no wheezes. She has no rales.  Abdominal: Soft. She exhibits no distension and no mass. There is no tenderness.  Musculoskeletal: She exhibits no edema.       Enlarged PIP joints both hands, most tender is left middle finger. No redness or warmth.   Neurological: She is alert.       Speech less slurred that usual  Skin:       Clean couple mm laceration adjacent to left lateral great toenail without signs of infection  Psychiatric: She has a normal mood and affect. Her behavior is normal. Judgment and thought content normal.          Assessment & Plan:

## 2012-08-26 NOTE — Assessment & Plan Note (Signed)
She's to continue the antibiotic ointment watching for signs of infection. Advised her to have a podiatrist cut her nails.

## 2012-08-29 ENCOUNTER — Encounter: Payer: Self-pay | Admitting: Family Medicine

## 2012-09-17 ENCOUNTER — Telehealth: Payer: Self-pay | Admitting: Family Medicine

## 2012-09-17 NOTE — Telephone Encounter (Signed)
[facility] calling because patient's insurance does not cover Lantus Solostar pen due to "dosage is too small."  Patient is upset because med is $344.  Pharmacy wants to know if Dr. Sheffield Slider can change dose in order to be covered by insurance or can samples be dispensed.  Per pharmacy---insurance covers doses at 17 units daily.  No Solostar samples available in office as of today, only Lantus vials.  Unsure if patient knows how to use vials & insulin syringes.  Pharmacy states Dr. Sheffield Slider can also write for patient to use "up to 17 units daily" and pharmacy can file it as a 90-day supply.  Will route request to Dr. Jennette Kettle since Dr. Sheffield Slider not in office and call pharmacy back.  Gaylene Brooks, RN

## 2012-09-17 NOTE — Telephone Encounter (Addendum)
Mrs. Manrique called to ask that a different insulin med be called to her pharmacy because now the Lantus Charlett Blake is costing her $355 and she cannot afford that.  Ms. Colegrove was told by the pharmacy she could get a different insulin injected pen for 90 day supply that would be cheaper.

## 2012-09-17 NOTE — Telephone Encounter (Signed)
Called pt and advised to call pharmacy and find out, which insulin is covered and Dr.Hale can review and change it. Pt agreed. Gwendolyn Wallace, Gwendolyn Wallace

## 2012-09-18 NOTE — Telephone Encounter (Signed)
Jeannette OK plz call in rx to use up to 17 u a day as directed and give 90 day supply. Then route this back toDr Sheffield Slider so he can come up with a long term plan for her [facility]! Denny Levy

## 2012-09-18 NOTE — Telephone Encounter (Signed)
New dosage for Lantus Solostar pen (17 units a day as directed) with #90 day supply called to [facility] per Dr. Jennette Kettle.  Routed note to Dr. Sheffield Slider to address when he returns.  Gaylene Brooks, RN

## 2012-10-02 ENCOUNTER — Other Ambulatory Visit: Payer: Self-pay | Admitting: Family Medicine

## 2012-10-15 ENCOUNTER — Other Ambulatory Visit: Payer: Self-pay | Admitting: Family Medicine

## 2012-10-29 ENCOUNTER — Encounter: Payer: Self-pay | Admitting: Family Medicine

## 2012-11-20 ENCOUNTER — Ambulatory Visit: Payer: Medicare Other

## 2012-11-25 ENCOUNTER — Telehealth: Payer: Self-pay | Admitting: *Deleted

## 2012-11-25 NOTE — Telephone Encounter (Signed)
[facility] calling to see if we received Rx request for diabetic shoes.  Will need to be signed and faxed back.  Informed that form has been received and is in doctor's box waiting to be reviewed.  Gaylene Brooks, RN

## 2012-11-27 ENCOUNTER — Ambulatory Visit (INDEPENDENT_AMBULATORY_CARE_PROVIDER_SITE_OTHER): Payer: Medicare Other | Admitting: Family Medicine

## 2012-11-27 VITALS — BP 128/64 | HR 57 | Temp 98.1°F | Ht 60.5 in | Wt 107.4 lb

## 2012-11-27 DIAGNOSIS — L608 Other nail disorders: Secondary | ICD-10-CM

## 2012-11-27 DIAGNOSIS — I7389 Other specified peripheral vascular diseases: Secondary | ICD-10-CM

## 2012-11-27 DIAGNOSIS — D518 Other vitamin B12 deficiency anemias: Secondary | ICD-10-CM

## 2012-11-27 DIAGNOSIS — E118 Type 2 diabetes mellitus with unspecified complications: Secondary | ICD-10-CM

## 2012-11-27 DIAGNOSIS — K3184 Gastroparesis: Secondary | ICD-10-CM

## 2012-11-27 LAB — POCT GLYCOSYLATED HEMOGLOBIN (HGB A1C): Hemoglobin A1C: 8.2

## 2012-11-27 LAB — POCT SKIN KOH: Skin KOH, POC: POSITIVE

## 2012-11-27 NOTE — Progress Notes (Signed)
  Subjective:    Patient ID: Gwendolyn Wallace, female    DOB: 18-Sep-1936, 76 y.o.   MRN: [id number]  HPI Diabetes - checking twice daily, around 100 fasting, and 160 in the evenings.No hypoglycemia.   Smoking - 1/4 PPD. Would like to quit  status post CVA - No TIA symptoms or chest pain   PVD - no claudication, but not walking far.   Pedal rash - expected a prescription cream last year, so never got it. Itches over the dorsum of the right forefoot.  Review of Systems     Objective:   Physical Exam  Constitutional:       Thin elderly female.   Cardiovascular: Normal rate and regular rhythm.   Murmur heard.      Gr 2/6 SEM  Pulmonary/Chest: Effort normal and breath sounds normal. She has no wheezes.  Musculoskeletal: She exhibits no edema.  Neurological: She is alert.       Slurred speech as usual  Skin:       Diabetic foot exam with superficial skin desquamation and mild thickening of nails  Psychiatric: She has a normal mood and affect. Her behavior is normal. Judgment and thought content normal.          Assessment & Plan:

## 2012-11-27 NOTE — Assessment & Plan Note (Signed)
Worsened control she blames on diet. Doesn't want to increase insulin, so will try to decrease sugar intake.

## 2012-11-27 NOTE — Assessment & Plan Note (Signed)
Continue oral supplementation 

## 2012-11-27 NOTE — Assessment & Plan Note (Signed)
KOH positive, so letter sent to use otc antifungal creams.

## 2012-11-27 NOTE — Patient Instructions (Addendum)
Please call the Quit line for help in quitting smoking. It would be great for your health if you can quit.   Avoid the sneaky sugar in food.  Please return to see Dr Sheffield Slider in 3 months.

## 2012-11-27 NOTE — Assessment & Plan Note (Signed)
Not currently symptomatic

## 2012-11-27 NOTE — Assessment & Plan Note (Signed)
Well-controlled on current medications 

## 2012-11-28 ENCOUNTER — Encounter: Payer: Self-pay | Admitting: Family Medicine

## 2012-12-01 NOTE — Telephone Encounter (Signed)
Completed and placed in fax box the day of her visit 12/19

## 2012-12-04 ENCOUNTER — Other Ambulatory Visit: Payer: Self-pay | Admitting: Family Medicine

## 2013-01-08 ENCOUNTER — Telehealth: Payer: Self-pay | Admitting: Family Medicine

## 2013-01-08 NOTE — Telephone Encounter (Signed)
Diabetic shoe form completed for [facility] listing PVD, DIABETES MELLITUS2, and toe deformity.

## 2013-01-09 ENCOUNTER — Other Ambulatory Visit: Payer: Self-pay | Admitting: Family Medicine

## 2013-03-19 ENCOUNTER — Encounter: Payer: Self-pay | Admitting: Family Medicine

## 2013-03-24 ENCOUNTER — Other Ambulatory Visit: Payer: Self-pay | Admitting: *Deleted

## 2013-03-24 ENCOUNTER — Telehealth: Payer: Self-pay | Admitting: *Deleted

## 2013-03-24 DIAGNOSIS — E118 Type 2 diabetes mellitus with unspecified complications: Secondary | ICD-10-CM

## 2013-03-24 DIAGNOSIS — E1165 Type 2 diabetes mellitus with hyperglycemia: Secondary | ICD-10-CM

## 2013-03-24 MED ORDER — INSULIN GLARGINE 100 UNIT/ML ~~LOC~~ SOLN: 17.0000 [IU] | Freq: Every day | SUBCUTANEOUS | Status: DC

## 2013-03-24 MED ORDER — INSULIN GLARGINE 100 UNIT/ML ~~LOC~~ SOLN: 8.0000 [IU] | Freq: Every day | SUBCUTANEOUS | Status: DC

## 2013-03-24 NOTE — Telephone Encounter (Signed)
Received call from [facility].  States patient last RX for Lantus Solostar was for 17 units daily as directed but the one we sent in today was for 8 units daily.  Per Dr. Martin Majestic note from 09/17/2012 dosage was changed to 17 units daily as directed.  Resent in new Rx with correct dose.  Ileana Ladd

## 2013-03-24 NOTE — Telephone Encounter (Signed)
Patient is scheduled to see Dr. Sheffield Slider in [facility] on 04/02/2013.  Gwendolyn Wallace

## 2013-04-02 ENCOUNTER — Encounter: Payer: Self-pay | Admitting: Family Medicine

## 2013-04-02 ENCOUNTER — Ambulatory Visit (INDEPENDENT_AMBULATORY_CARE_PROVIDER_SITE_OTHER): Payer: Medicare Other | Admitting: Family Medicine

## 2013-04-02 VITALS — BP 124/74 | HR 60 | Temp 97.9°F | Wt 104.9 lb

## 2013-04-02 DIAGNOSIS — E118 Type 2 diabetes mellitus with unspecified complications: Secondary | ICD-10-CM

## 2013-04-02 DIAGNOSIS — IMO0002 Reserved for concepts with insufficient information to code with codable children: Secondary | ICD-10-CM

## 2013-04-02 DIAGNOSIS — Z9181 History of falling: Secondary | ICD-10-CM | POA: Insufficient documentation

## 2013-04-02 DIAGNOSIS — E1165 Type 2 diabetes mellitus with hyperglycemia: Secondary | ICD-10-CM

## 2013-04-02 DIAGNOSIS — K3184 Gastroparesis: Secondary | ICD-10-CM

## 2013-04-02 LAB — POCT GLYCOSYLATED HEMOGLOBIN (HGB A1C): Hemoglobin A1C: 7.8

## 2013-04-02 NOTE — Patient Instructions (Addendum)
It was good to see you today, Gwendolyn Wallace. To help increase your appetite, take Reglan (Metoclopramide) 2 tablets every 4 hours. Continue current medications for diabetes and call your doctor if blood sugar > 250 or < 70. Please fill out medical release forms and send them to Dr. Margo Aye. Schedule follow up appointment with Dr. Margo Aye in July.

## 2013-04-02 NOTE — Progress Notes (Signed)
  Subjective:    Patient ID: Gwendolyn Wallace, female    DOB: [birth year], 77 y.o.   MRN: [id number]  HPI  Patient presents to clinic for routine follow up and fall risk assessment.  Fall risk:  Patient has a hx of PVD, DMT2, osteoporosis who is at moderate risk for falls.  She has not fallen in the last year, but she does admit to feeling unsteady when walking and is unable to walk up steps without holding onto the rails.  Patient denies any decreased sensation in either foot.  She does not have rugs or stairs in her house.  She has proper lighting, a shower chair, and a cane.  Medications were reviewed.  She does not take any medications for sleep or mood.  Diabetes type 2: Patient's hemoglobin A1c today is 7.8.  CBG range from 120-180.  She denies any hypoglycemic episodes.  Denies any HA, nausea/vomiting, excessive sweating, or dizzy spells.  She takes both Metformin 1000 BID and Lantus 17 units daily.  Weight loss: Patient has lost about 3 lb in the last 4 months.  Patient admits to getting full easily.  She takes Reglan 5 mg with meals as needed.  She says she has had decreased appetite since her husband passed away in Apr 24, 2008.  Patient continues to smoke despite persistent counseling.  Review of Systems Per HPI     Objective:   Physical Exam  Constitutional: No distress.  Thin, but not frail appearing  HENT:  Head: Normocephalic and atraumatic.  Right Ear: External ear normal.  Left Ear: External ear normal.  Mouth/Throat: Oropharynx is clear and moist.  Eyes: Conjunctivae are normal. No scleral icterus.  Pupils constricted B/L  Neck: Neck supple.  Cardiovascular: Normal rate and regular rhythm.   Pulmonary/Chest: Effort normal and breath sounds normal.  Musculoskeletal:  Trace edema ankles bilaterally  Neurological:  Time and up go test: could not stand from chair without using her hands, but able to walk 10 feet and sit in 15 seconds; mild slurred speech, no other focal deficits   Skin: Skin is warm and dry.      Assessment & Plan:

## 2013-04-02 NOTE — Assessment & Plan Note (Signed)
Increase Reglan to 10 mg to see if this helps increase appetite, decrease early satiety.

## 2013-04-02 NOTE — Assessment & Plan Note (Signed)
Performed fall risk algorithm with patient.  Discussed importance of exercise/walking.  She does not Vit D at this time.  Consider repeating Vit D level.  She does have proper lighting, cane, and shower chairs at home.  Handout given for fall prevention and checklist to be reviewed with family.

## 2013-04-02 NOTE — Assessment & Plan Note (Signed)
Hemoglobin A1c 7.8 today, down from 8.2.  No episodes of hypoglycemia.  Continue current regimen.  Encouraged follow up appointment with new MD, Dr. Margo Aye, in 3 months for diabetes check up.

## 2013-04-03 ENCOUNTER — Other Ambulatory Visit: Payer: Self-pay | Admitting: Family Medicine

## 2013-04-03 DIAGNOSIS — Z09 Encounter for follow-up examination after completed treatment for conditions other than malignant neoplasm: Secondary | ICD-10-CM

## 2013-04-23 ENCOUNTER — Ambulatory Visit (HOSPITAL_COMMUNITY)
Admission: RE | Admit: 2013-04-23 | Discharge: 2013-04-23 | Disposition: A | Discharge status: Home / Self Care | Payer: Medicare Other | Source: Ambulatory Visit | Attending: Family Medicine | Admitting: Family Medicine

## 2013-04-23 DIAGNOSIS — Z1231 Encounter for screening mammogram for malignant neoplasm of breast: Secondary | ICD-10-CM | POA: Insufficient documentation

## 2013-04-23 DIAGNOSIS — Z09 Encounter for follow-up examination after completed treatment for conditions other than malignant neoplasm: Secondary | ICD-10-CM

## 2013-05-21 ENCOUNTER — Other Ambulatory Visit: Payer: Self-pay | Admitting: *Deleted

## 2013-05-21 ENCOUNTER — Other Ambulatory Visit: Payer: Self-pay | Admitting: Family Medicine

## 2013-05-21 MED ORDER — METFORMIN HCL 1000 MG PO TABS: ORAL_TABLET | ORAL | Status: DC

## 2013-07-30 ENCOUNTER — Other Ambulatory Visit: Payer: Self-pay | Admitting: Family Medicine

## 2013-09-08 ENCOUNTER — Other Ambulatory Visit: Payer: Self-pay | Admitting: Family Medicine

## 2013-10-05 ENCOUNTER — Other Ambulatory Visit: Payer: Self-pay | Admitting: Family Medicine

## 2014-04-27 ENCOUNTER — Other Ambulatory Visit (HOSPITAL_COMMUNITY): Payer: Self-pay | Admitting: Internal Medicine

## 2014-04-27 DIAGNOSIS — Z1231 Encounter for screening mammogram for malignant neoplasm of breast: Secondary | ICD-10-CM

## 2014-04-29 ENCOUNTER — Ambulatory Visit (HOSPITAL_COMMUNITY): Payer: Medicare Other

## 2014-04-30 ENCOUNTER — Ambulatory Visit (HOSPITAL_COMMUNITY)
Admission: RE | Admit: 2014-04-30 | Discharge: 2014-04-30 | Disposition: A | Discharge status: Home / Self Care | Payer: Medicare HMO | Source: Ambulatory Visit | Attending: Internal Medicine | Admitting: Internal Medicine

## 2014-04-30 DIAGNOSIS — Z1231 Encounter for screening mammogram for malignant neoplasm of breast: Secondary | ICD-10-CM

## 2015-03-16 ENCOUNTER — Other Ambulatory Visit (HOSPITAL_COMMUNITY): Payer: Self-pay | Admitting: Internal Medicine

## 2015-03-16 DIAGNOSIS — N183 Chronic kidney disease, stage 3 unspecified: Secondary | ICD-10-CM

## 2015-03-16 DIAGNOSIS — R0989 Other specified symptoms and signs involving the circulatory and respiratory systems: Secondary | ICD-10-CM

## 2015-03-18 ENCOUNTER — Ambulatory Visit (HOSPITAL_COMMUNITY)
Admission: RE | Admit: 2015-03-18 | Discharge: 2015-03-18 | Disposition: A | Discharge status: Home / Self Care | Payer: PPO | Source: Ambulatory Visit | Attending: Internal Medicine | Admitting: Internal Medicine

## 2015-03-18 DIAGNOSIS — R0989 Other specified symptoms and signs involving the circulatory and respiratory systems: Secondary | ICD-10-CM

## 2015-03-18 DIAGNOSIS — E119 Type 2 diabetes mellitus without complications: Secondary | ICD-10-CM | POA: Diagnosis not present

## 2015-03-18 DIAGNOSIS — N183 Chronic kidney disease, stage 3 unspecified: Secondary | ICD-10-CM

## 2015-03-18 DIAGNOSIS — I6523 Occlusion and stenosis of bilateral carotid arteries: Secondary | ICD-10-CM | POA: Insufficient documentation

## 2015-07-08 ENCOUNTER — Other Ambulatory Visit (HOSPITAL_COMMUNITY): Payer: Self-pay | Admitting: Nephrology

## 2015-07-08 DIAGNOSIS — N183 Chronic kidney disease, stage 3 unspecified: Secondary | ICD-10-CM

## 2015-08-03 ENCOUNTER — Ambulatory Visit (HOSPITAL_COMMUNITY): Admission: RE | Admit: 2015-08-03 | Payer: PPO | Source: Ambulatory Visit

## 2016-07-13 ENCOUNTER — Encounter (HOSPITAL_COMMUNITY): Payer: Self-pay | Admitting: Emergency Medicine

## 2016-07-13 ENCOUNTER — Emergency Department (HOSPITAL_COMMUNITY)
Admission: EM | Admit: 2016-07-13 | Discharge: 2016-07-13 | Disposition: A | Discharge status: Home / Self Care | Payer: PPO | Attending: Emergency Medicine | Admitting: Emergency Medicine

## 2016-07-13 ENCOUNTER — Emergency Department (HOSPITAL_COMMUNITY): Payer: PPO

## 2016-07-13 DIAGNOSIS — E119 Type 2 diabetes mellitus without complications: Secondary | ICD-10-CM | POA: Insufficient documentation

## 2016-07-13 DIAGNOSIS — Z79899 Other long term (current) drug therapy: Secondary | ICD-10-CM | POA: Insufficient documentation

## 2016-07-13 DIAGNOSIS — I10 Essential (primary) hypertension: Secondary | ICD-10-CM | POA: Diagnosis not present

## 2016-07-13 DIAGNOSIS — F17210 Nicotine dependence, cigarettes, uncomplicated: Secondary | ICD-10-CM | POA: Insufficient documentation

## 2016-07-13 DIAGNOSIS — Z7982 Long term (current) use of aspirin: Secondary | ICD-10-CM | POA: Diagnosis not present

## 2016-07-13 DIAGNOSIS — K529 Noninfective gastroenteritis and colitis, unspecified: Secondary | ICD-10-CM

## 2016-07-13 DIAGNOSIS — Z7984 Long term (current) use of oral hypoglycemic drugs: Secondary | ICD-10-CM | POA: Insufficient documentation

## 2016-07-13 DIAGNOSIS — R112 Nausea with vomiting, unspecified: Secondary | ICD-10-CM | POA: Diagnosis present

## 2016-07-13 HISTORY — DX: Type 2 diabetes mellitus without complications: E11.9

## 2016-07-13 HISTORY — DX: Essential (primary) hypertension: I10

## 2016-07-13 LAB — COMPREHENSIVE METABOLIC PANEL
ALT: 10 U/L — AB (ref 14–54)
AST: 19 U/L (ref 15–41)
Albumin: 3 g/dL — ABNORMAL LOW (ref 3.5–5.0)
Alkaline Phosphatase: 132 U/L — ABNORMAL HIGH (ref 38–126)
Anion gap: 10 (ref 5–15)
BILIRUBIN TOTAL: 0.5 mg/dL (ref 0.3–1.2)
BUN: 20 mg/dL (ref 6–20)
CO2: 26 mmol/L (ref 22–32)
CREATININE: 1.71 mg/dL — AB (ref 0.44–1.00)
Calcium: 8.6 mg/dL — ABNORMAL LOW (ref 8.9–10.3)
Chloride: 99 mmol/L — ABNORMAL LOW (ref 101–111)
GFR calc Af Amer: 32 mL/min — ABNORMAL LOW (ref >60)
GFR, EST NON AFRICAN AMERICAN: 27 mL/min — AB (ref >60)
Glucose, Bld: 225 mg/dL — ABNORMAL HIGH (ref 65–99)
Potassium: 4.3 mmol/L (ref 3.5–5.1)
Sodium: 135 mmol/L (ref 135–145)
TOTAL PROTEIN: 7.6 g/dL (ref 6.5–8.1)

## 2016-07-13 LAB — CBC WITH DIFFERENTIAL/PLATELET
BASOS ABS: 0.1 10*3/uL (ref 0.0–0.1)
Basophils Relative: 1 %
Eosinophils Absolute: 0.5 10*3/uL (ref 0.0–0.7)
Eosinophils Relative: 4 %
HEMATOCRIT: 39.7 % (ref 36.0–46.0)
Hemoglobin: 12.7 g/dL (ref 12.0–15.0)
LYMPHS PCT: 27 %
Lymphs Abs: 3.3 10*3/uL (ref 0.7–4.0)
MCH: 27.3 pg (ref 26.0–34.0)
MCHC: 32 g/dL (ref 30.0–36.0)
MCV: 85.4 fL (ref 78.0–100.0)
MONO ABS: 0.8 10*3/uL (ref 0.1–1.0)
Monocytes Relative: 7 %
NEUTROS ABS: 7.5 10*3/uL (ref 1.7–7.7)
Neutrophils Relative %: 61 %
Platelets: 448 10*3/uL — ABNORMAL HIGH (ref 150–400)
RBC: 4.65 MIL/uL (ref 3.87–5.11)
RDW: 14.1 % (ref 11.5–15.5)
WBC: 12.2 10*3/uL — AB (ref 4.0–10.5)

## 2016-07-13 LAB — CBG MONITORING, ED: GLUCOSE-CAPILLARY: 224 mg/dL — AB (ref 65–99)

## 2016-07-13 LAB — LIPASE, BLOOD: Lipase: 21 U/L (ref 11–51)

## 2016-07-13 MED ORDER — PANTOPRAZOLE SODIUM 20 MG PO TBEC: 20.0000 mg | DELAYED_RELEASE_TABLET | Freq: Every day | ORAL | 0 refills | Status: AC

## 2016-07-13 MED ORDER — PANTOPRAZOLE SODIUM 40 MG IV SOLR
40.0000 mg | Freq: Once | INTRAVENOUS | Status: AC
  Administered 2016-07-13: 40 mg via INTRAVENOUS
  Filled 2016-07-13: qty 40

## 2016-07-13 MED ORDER — ONDANSETRON HCL 4 MG/2ML IJ SOLN
4.0000 mg | Freq: Once | INTRAMUSCULAR | Status: AC
  Administered 2016-07-13: 4 mg via INTRAVENOUS
  Filled 2016-07-13: qty 2

## 2016-07-13 MED ORDER — ONDANSETRON HCL 4 MG PO TABS: 4.0000 mg | ORAL_TABLET | Freq: Four times a day (QID) | ORAL | 0 refills | Status: DC

## 2016-07-13 MED ORDER — SODIUM CHLORIDE 0.9 % IV BOLUS (SEPSIS): 1000.0000 mL | Freq: Once | INTRAVENOUS | Status: DC

## 2016-07-13 MED ORDER — SODIUM CHLORIDE 0.9 % IV SOLN
Freq: Once | INTRAVENOUS | Status: AC
  Administered 2016-07-13: 15:00:00 via INTRAVENOUS

## 2016-07-13 NOTE — Discharge Instructions (Signed)
Medication for nausea and irritated stomach. Clear liquids for next 12 hours. Return if worse.

## 2016-07-13 NOTE — ED Notes (Signed)
DR cook notified of pts sat 87-88 % on room air. O2 at 2 L/min initiated. CXR ordered. Pt noted to have cough

## 2016-07-13 NOTE — ED Triage Notes (Signed)
Pt states she received an injection on Wednesday at the doctor's office for the first time and about 15 minutes after leaving the office began having nausea and vomiting.

## 2016-07-13 NOTE — ED Notes (Signed)
O2 removed and sat decrease to 87 %. O2 @ 2 L replaced. Dr Adriana Simas notified

## 2016-07-13 NOTE — ED Provider Notes (Signed)
[facility]-EMERGENCY DEPT Provider Note   CSN: [id number] Arrival date & time: 07/13/16  1219  First Provider Contact:  First MD Initiated Contact with Patient 07/13/16 1346   By signing my name below, I, Bridgette Habermann, attest that this documentation has been prepared under the direction and in the presence of Donnetta Hutching, MD. Electronically Signed: Bridgette Habermann, ED Scribe. 07/13/16. 2:08 PM.   History   Chief Complaint Chief Complaint  Patient presents with  . Emesis   HPI Comments: Gwendolyn Wallace is a 80 y.o. female who presents to the Emergency Department complaining of sudden onset, constant nausea and episodic vomiting secondary to an injection pt received 2 days ago at 10:30 am (Trulicy dulaglutide .75 mg). Pt's grandmother states her symptoms presented 15 minutes after she got the shot. Pt also has associated nonproductive cough and abdominal pain. No alleviating factors tried PTA. Denies fever.  The history is provided by the patient. No language interpreter was used.    Past Medical History:  Diagnosis Date  . Carotid stenosis, bilateral 08/07/05   bilat 40-60%  . CVA (cerebral infarction) 07/10/02  . Diabetes mellitus without complication (HCC)   . Gastric bleeding 10/01  . Herpes zoster 4/01  . Hypertension     Patient Active Problem List   Diagnosis Date Noted  . At high risk for falls 04/02/2013  . Osteoarthritis of hand, left 08/26/2012  . Other vitamin B12 deficiency anemia 12/05/2010  . Personal history of fall 11/08/2009  . DYSTROPHIC NAILS 02/24/2008  . Gastroparesis 05/27/2007  . PVD WITH CLAUDICATION 04/03/2007  . IMPAIRMENT, ONE EYE MODERATE, OTH NEAR-NORMAL 02/25/2007  . DIABETES MELLITUS, II, COMPLICATIONS 02/06/2007  . HYPERLIPIDEMIA 02/06/2007  . TOBACCO DEPENDENCE 02/06/2007  . HYPERTENSION, BENIGN SYSTEMIC 02/06/2007  . CORONARY, ARTERIOSCLEROSIS 02/06/2007  . CAROTID ARTERY NARROWING 02/06/2007  . PEPTIC ULCER DIS., UNSPEC. W/O OBSTRUCTION 02/06/2007  .  INCONTINENCE, STRESS, FEMALE 02/06/2007  . Osteoporosis, unspecified 02/06/2007  . CEREBROVAS DIS, LATE EFFECTS (S/P CVA) 02/06/2007    Past Surgical History:  Procedure Laterality Date  . CATARACT EXTRACTION, BILATERAL  07/30/2006  . CORONARY ARTERY BYPASS GRAFT  03/11/1999  . PANRETINAL PHOTOCOAGULATION  07/10/2002  . PANRETINAL PHOTOCOAGULATION  4/05    OB History    No data available       Home Medications    Prior to Admission medications   Medication Sig Start Date End Date Taking? Authorizing Provider  acetaminophen (TYLENOL) 650 MG CR tablet Take 650 mg by mouth every 8 (eight) hours as needed.   Yes Historical Provider, MD  amLODipine (NORVASC) 10 MG tablet TAKE ONE TABLET ONCE DAILY 01/09/13  Yes Zachery Dauer, MD  aspirin ([employer] ASPIRIN) 81 MG chewable tablet Chew 81 mg by mouth daily.     Yes Historical Provider, MD  atorvastatin (LIPITOR) 40 MG tablet Take 1 tablet (40 mg total) by mouth daily. 08/26/12  Yes Zachery Dauer, MD  calcium carbonate (TUMS E-X 750) 750 MG chewable tablet Chew 1 tablet by mouth 2 (two) times daily as needed for heartburn.    Yes Historical Provider, MD  cyanocobalamin 2000 MCG tablet Take 2,000 mcg by mouth daily.     Yes Zachery Dauer, MD  Dulaglutide (TRULICITY) 0.75 MG/0.5ML SOPN Inject 0.5 mLs into the skin every 7 (seven) days.    Yes Historical Provider, MD  lisinopril-hydrochlorothiazide (PRINZIDE,ZESTORETIC) 20-12.5 MG per tablet TAKE ONE TABLET TWICE DAILY 09/08/13  Yes Uvaldo Rising, MD  metFORMIN (GLUCOPHAGE) 1000 MG tablet TAKE ONE  AND ONE-HALF TABLET EVERY MORNING AND ONE TABLET AT SUPPER 10/05/13  Yes Uvaldo Rising, MD  metoclopramide (REGLAN) 5 MG tablet Take 5 mg by mouth 4 (four) times daily as needed. Before meals and at bedtime   Yes Historical Provider, MD  metoprolol (LOPRESSOR) 50 MG tablet TAKE ONE TABLET TWICE DAILY 10/02/12  Yes Zachery Dauer, MD  Multiple Vitamin (MULTIVITAMIN) capsule Take 1 capsule by mouth daily.     Yes  Historical Provider, MD  nitroGLYCERIN (NITROSTAT) 0.4 MG SL tablet Place 1 tablet under tongue as directed    Yes Historical Provider, MD  ondansetron (ZOFRAN) 4 MG tablet Take 1 tablet (4 mg total) by mouth every 6 (six) hours. 07/13/16   Donnetta Hutching, MD  pantoprazole (PROTONIX) 20 MG tablet Take 1 tablet (20 mg total) by mouth daily. 07/13/16   Donnetta Hutching, MD    Family History Family History  Problem Relation Age of Onset  . Stroke Brother   . Diabetes Mother   . Diabetes Son   . Diabetes Brother   . Heart attack Brother   . Cholecystitis Daughter   . Cholecystitis Daughter   . Heart attack Father     Social History Social History  Substance Use Topics  . Smoking status: Current Every Day Smoker    Packs/day: 0.25    Types: Cigarettes  . Smokeless tobacco: Not on file  . Alcohol use No     Allergies   Gemfibrozil   Review of Systems Review of Systems Ten systems reviewed and are negative for acute change, except as noted in the HPI.  Physical Exam Updated Vital Signs BP 128/78   Pulse 79   Temp 98.1 F (36.7 C) (Oral)   Resp 16   Ht 5' (1.524 m)   Wt 100 lb (45.4 kg)   SpO2 (!) 88%   BMI 19.53 kg/m   Physical Exam  Constitutional: She is oriented to person, place, and time. She appears well-developed and well-nourished.  Pt looks slightly dehydrated.  HENT:  Head: Normocephalic and atraumatic.  Eyes: Conjunctivae are normal.  Neck: Neck supple.  Cardiovascular: Normal rate and regular rhythm.   Pulmonary/Chest: Effort normal and breath sounds normal.  Abdominal: Soft. Bowel sounds are normal. There is tenderness.  Tender in epigastric region.  Musculoskeletal: Normal range of motion.  Neurological: She is alert and oriented to person, place, and time.  Skin: Skin is warm and dry.  Psychiatric: She has a normal mood and affect. Her behavior is normal.  Nursing note and vitals reviewed.    ED Treatments / Results  DIAGNOSTIC STUDIES: Oxygen  Saturation is 99% on RA, normal by my interpretation.    COORDINATION OF CARE: 1:54 PM Discussed treatment plan with pt at bedside which includes IVF and pt agreed to plan.  Labs (all labs ordered are listed, but only abnormal results are displayed) Labs Reviewed  CBC WITH DIFFERENTIAL/PLATELET - Abnormal; Notable for the following:       Result Value   WBC 12.2 (*)    Platelets 448 (*)    All other components within normal limits  COMPREHENSIVE METABOLIC PANEL - Abnormal; Notable for the following:    Chloride 99 (*)    Glucose, Bld 225 (*)    Creatinine, Ser 1.71 (*)    Calcium 8.6 (*)    Albumin 3.0 (*)    ALT 10 (*)    Alkaline Phosphatase 132 (*)    GFR calc non Af Amer 27 (*)  GFR calc Af Amer 32 (*)    All other components within normal limits  CBG MONITORING, ED - Abnormal; Notable for the following:    Glucose-Capillary 224 (*)    All other components within normal limits  LIPASE, BLOOD    EKG  EKG Interpretation None       Radiology Dg Chest 2 View  Result Date: 07/13/2016 CLINICAL DATA:  Coughing and shortness of breath. EXAM: CHEST  2 VIEW COMPARISON:  December 31, 2008 FINDINGS: Post sternotomy changes. Mild haziness over the left lung base, not significantly changed since January 2010, probably due to overlapping soft tissues. No suspicious focal infiltrate is identified. No nodules or masses. No other acute interval changes. IMPRESSION: No active cardiopulmonary disease. Electronically Signed   By: Gerome Sam III M.D   On: 07/13/2016 15:39    Procedures Procedures (including critical care time)  Medications Ordered in ED Medications  ondansetron [facility]) injection 4 mg (4 mg Intravenous Given 07/13/16 1444)  0.9 %  sodium chloride infusion ( Intravenous Stopped 07/13/16 1701)  pantoprazole (PROTONIX) injection 40 mg (40 mg Intravenous Given 07/13/16 1444)     Initial Impression / Assessment and Plan / ED Course  I have reviewed the triage vital  signs and the nursing notes.  Pertinent labs & imaging results that were available during my care of the patient were reviewed by me and considered in my medical decision making (see chart for details).  Clinical Course    Patient feels much better after IV fluids and IV Zofran, IV Protonix.  She is urinating. No acute abdomen. Glucose is noted to be slightly elevated. Discharge medications Zofran 4 mg and Protonix 20 mg.  Final Clinical Impressions(s) / ED Diagnoses   Final diagnoses:  Gastroenteritis    New Prescriptions New Prescriptions   ONDANSETRON (ZOFRAN) 4 MG TABLET    Take 1 tablet (4 mg total) by mouth every 6 (six) hours.   PANTOPRAZOLE (PROTONIX) 20 MG TABLET    Take 1 tablet (20 mg total) by mouth daily.  I personally performed the services described in this documentation, which was scribed in my presence. The recorded information has been reviewed and is accurate.      Donnetta Hutching, MD 07/13/16 2020

## 2016-07-13 NOTE — ED Notes (Signed)
Patient walked to the bathroom with stand by assistance.

## 2016-07-23 ENCOUNTER — Other Ambulatory Visit (HOSPITAL_COMMUNITY): Payer: Self-pay | Admitting: Internal Medicine

## 2016-07-23 DIAGNOSIS — R0989 Other specified symptoms and signs involving the circulatory and respiratory systems: Secondary | ICD-10-CM

## 2016-07-25 ENCOUNTER — Ambulatory Visit (HOSPITAL_COMMUNITY): Payer: PPO

## 2016-07-25 ENCOUNTER — Other Ambulatory Visit (HOSPITAL_COMMUNITY): Payer: Self-pay | Admitting: Internal Medicine

## 2016-07-25 DIAGNOSIS — R1084 Generalized abdominal pain: Secondary | ICD-10-CM

## 2016-07-26 ENCOUNTER — Ambulatory Visit (HOSPITAL_COMMUNITY)
Admission: RE | Admit: 2016-07-26 | Discharge: 2016-07-26 | Disposition: A | Discharge status: Home / Self Care | Payer: PPO | Source: Ambulatory Visit | Attending: Internal Medicine | Admitting: Internal Medicine

## 2016-07-26 DIAGNOSIS — N200 Calculus of kidney: Secondary | ICD-10-CM | POA: Diagnosis not present

## 2016-07-26 DIAGNOSIS — N261 Atrophy of kidney (terminal): Secondary | ICD-10-CM | POA: Insufficient documentation

## 2016-07-26 DIAGNOSIS — K8020 Calculus of gallbladder without cholecystitis without obstruction: Secondary | ICD-10-CM | POA: Diagnosis not present

## 2016-07-26 DIAGNOSIS — R1084 Generalized abdominal pain: Secondary | ICD-10-CM | POA: Diagnosis not present

## 2016-07-26 DIAGNOSIS — I700 Atherosclerosis of aorta: Secondary | ICD-10-CM | POA: Diagnosis not present

## 2016-07-26 DIAGNOSIS — K824 Cholesterolosis of gallbladder: Secondary | ICD-10-CM | POA: Insufficient documentation

## 2016-09-21 ENCOUNTER — Emergency Department (HOSPITAL_COMMUNITY): Payer: PPO

## 2016-09-21 ENCOUNTER — Inpatient Hospital Stay (HOSPITAL_COMMUNITY)
Admission: EM | Admit: 2016-09-21 | Discharge: 2016-09-26 | DRG: 064 | Disposition: A | Discharge status: Home / Self Care | Payer: PPO | Attending: Cardiology | Admitting: Cardiology

## 2016-09-21 ENCOUNTER — Encounter (HOSPITAL_COMMUNITY): Payer: Self-pay | Admitting: *Deleted

## 2016-09-21 DIAGNOSIS — I272 Pulmonary hypertension, unspecified: Secondary | ICD-10-CM | POA: Diagnosis present

## 2016-09-21 DIAGNOSIS — I081 Rheumatic disorders of both mitral and tricuspid valves: Secondary | ICD-10-CM | POA: Diagnosis present

## 2016-09-21 DIAGNOSIS — M25552 Pain in left hip: Secondary | ICD-10-CM | POA: Diagnosis present

## 2016-09-21 DIAGNOSIS — Z823 Family history of stroke: Secondary | ICD-10-CM

## 2016-09-21 DIAGNOSIS — IMO0002 Reserved for concepts with insufficient information to code with codable children: Secondary | ICD-10-CM

## 2016-09-21 DIAGNOSIS — N289 Disorder of kidney and ureter, unspecified: Secondary | ICD-10-CM

## 2016-09-21 DIAGNOSIS — I6523 Occlusion and stenosis of bilateral carotid arteries: Secondary | ICD-10-CM | POA: Diagnosis present

## 2016-09-21 DIAGNOSIS — D72829 Elevated white blood cell count, unspecified: Secondary | ICD-10-CM | POA: Diagnosis present

## 2016-09-21 DIAGNOSIS — R471 Dysarthria and anarthria: Secondary | ICD-10-CM | POA: Diagnosis present

## 2016-09-21 DIAGNOSIS — E785 Hyperlipidemia, unspecified: Secondary | ICD-10-CM

## 2016-09-21 DIAGNOSIS — I10 Essential (primary) hypertension: Secondary | ICD-10-CM | POA: Diagnosis present

## 2016-09-21 DIAGNOSIS — Z8673 Personal history of transient ischemic attack (TIA), and cerebral infarction without residual deficits: Secondary | ICD-10-CM

## 2016-09-21 DIAGNOSIS — G458 Other transient cerebral ischemic attacks and related syndromes: Secondary | ICD-10-CM | POA: Diagnosis present

## 2016-09-21 DIAGNOSIS — G459 Transient cerebral ischemic attack, unspecified: Secondary | ICD-10-CM

## 2016-09-21 DIAGNOSIS — Z7982 Long term (current) use of aspirin: Secondary | ICD-10-CM

## 2016-09-21 DIAGNOSIS — I639 Cerebral infarction, unspecified: Principal | ICD-10-CM

## 2016-09-21 DIAGNOSIS — Z794 Long term (current) use of insulin: Secondary | ICD-10-CM

## 2016-09-21 DIAGNOSIS — R4701 Aphasia: Secondary | ICD-10-CM | POA: Diagnosis present

## 2016-09-21 DIAGNOSIS — I130 Hypertensive heart and chronic kidney disease with heart failure and stage 1 through stage 4 chronic kidney disease, or unspecified chronic kidney disease: Secondary | ICD-10-CM | POA: Diagnosis present

## 2016-09-21 DIAGNOSIS — I5033 Acute on chronic diastolic (congestive) heart failure: Secondary | ICD-10-CM | POA: Diagnosis not present

## 2016-09-21 DIAGNOSIS — I6529 Occlusion and stenosis of unspecified carotid artery: Secondary | ICD-10-CM | POA: Diagnosis present

## 2016-09-21 DIAGNOSIS — Z951 Presence of aortocoronary bypass graft: Secondary | ICD-10-CM

## 2016-09-21 DIAGNOSIS — R2681 Unsteadiness on feet: Secondary | ICD-10-CM

## 2016-09-21 DIAGNOSIS — I708 Atherosclerosis of other arteries: Secondary | ICD-10-CM | POA: Diagnosis present

## 2016-09-21 DIAGNOSIS — F17210 Nicotine dependence, cigarettes, uncomplicated: Secondary | ICD-10-CM | POA: Diagnosis present

## 2016-09-21 DIAGNOSIS — E1165 Type 2 diabetes mellitus with hyperglycemia: Secondary | ICD-10-CM | POA: Diagnosis present

## 2016-09-21 DIAGNOSIS — R4702 Dysphasia: Secondary | ICD-10-CM | POA: Diagnosis present

## 2016-09-21 DIAGNOSIS — I671 Cerebral aneurysm, nonruptured: Secondary | ICD-10-CM | POA: Diagnosis present

## 2016-09-21 DIAGNOSIS — I4581 Long QT syndrome: Secondary | ICD-10-CM | POA: Diagnosis present

## 2016-09-21 DIAGNOSIS — D518 Other vitamin B12 deficiency anemias: Secondary | ICD-10-CM | POA: Diagnosis present

## 2016-09-21 DIAGNOSIS — R3129 Other microscopic hematuria: Secondary | ICD-10-CM | POA: Diagnosis present

## 2016-09-21 DIAGNOSIS — E1122 Type 2 diabetes mellitus with diabetic chronic kidney disease: Secondary | ICD-10-CM | POA: Diagnosis present

## 2016-09-21 DIAGNOSIS — T82898A Other specified complication of vascular prosthetic devices, implants and grafts, initial encounter: Secondary | ICD-10-CM | POA: Diagnosis present

## 2016-09-21 DIAGNOSIS — D519 Vitamin B12 deficiency anemia, unspecified: Secondary | ICD-10-CM | POA: Diagnosis present

## 2016-09-21 DIAGNOSIS — Z833 Family history of diabetes mellitus: Secondary | ICD-10-CM

## 2016-09-21 DIAGNOSIS — E119 Type 2 diabetes mellitus without complications: Secondary | ICD-10-CM

## 2016-09-21 DIAGNOSIS — Z8249 Family history of ischemic heart disease and other diseases of the circulatory system: Secondary | ICD-10-CM

## 2016-09-21 DIAGNOSIS — IMO0001 Reserved for inherently not codable concepts without codable children: Secondary | ICD-10-CM

## 2016-09-21 DIAGNOSIS — N183 Chronic kidney disease, stage 3 (moderate): Secondary | ICD-10-CM | POA: Diagnosis present

## 2016-09-21 DIAGNOSIS — Y832 Surgical operation with anastomosis, bypass or graft as the cause of abnormal reaction of the patient, or of later complication, without mention of misadventure at the time of the procedure: Secondary | ICD-10-CM | POA: Diagnosis present

## 2016-09-21 DIAGNOSIS — I119 Hypertensive heart disease without heart failure: Secondary | ICD-10-CM

## 2016-09-21 DIAGNOSIS — I6339 Cerebral infarction due to thrombosis of other cerebral artery: Secondary | ICD-10-CM

## 2016-09-21 DIAGNOSIS — W010XXA Fall on same level from slipping, tripping and stumbling without subsequent striking against object, initial encounter: Secondary | ICD-10-CM | POA: Diagnosis present

## 2016-09-21 DIAGNOSIS — I214 Non-ST elevation (NSTEMI) myocardial infarction: Secondary | ICD-10-CM | POA: Diagnosis not present

## 2016-09-21 DIAGNOSIS — Z79899 Other long term (current) drug therapy: Secondary | ICD-10-CM

## 2016-09-21 DIAGNOSIS — I2510 Atherosclerotic heart disease of native coronary artery without angina pectoris: Secondary | ICD-10-CM | POA: Diagnosis present

## 2016-09-21 LAB — BASIC METABOLIC PANEL
ANION GAP: 11 (ref 5–15)
BUN: 19 mg/dL (ref 6–20)
CALCIUM: 9 mg/dL (ref 8.9–10.3)
CO2: 25 mmol/L (ref 22–32)
Chloride: 99 mmol/L — ABNORMAL LOW (ref 101–111)
Creatinine, Ser: 1.66 mg/dL — ABNORMAL HIGH (ref 0.44–1.00)
GFR, EST AFRICAN AMERICAN: 33 mL/min — AB (ref >60)
GFR, EST NON AFRICAN AMERICAN: 28 mL/min — AB (ref >60)
Glucose, Bld: 349 mg/dL — ABNORMAL HIGH (ref 65–99)
Potassium: 4.6 mmol/L (ref 3.5–5.1)
Sodium: 135 mmol/L (ref 135–145)

## 2016-09-21 LAB — CBG MONITORING, ED: GLUCOSE-CAPILLARY: 316 mg/dL — AB (ref 65–99)

## 2016-09-21 LAB — URINE MICROSCOPIC-ADD ON

## 2016-09-21 LAB — CBC
HCT: 35.1 % — ABNORMAL LOW (ref 36.0–46.0)
HEMOGLOBIN: 11.3 g/dL — AB (ref 12.0–15.0)
MCH: 26.7 pg (ref 26.0–34.0)
MCHC: 32.2 g/dL (ref 30.0–36.0)
MCV: 82.8 fL (ref 78.0–100.0)
Platelets: 395 10*3/uL (ref 150–400)
RBC: 4.24 MIL/uL (ref 3.87–5.11)
RDW: 14.4 % (ref 11.5–15.5)
WBC: 12.6 10*3/uL — ABNORMAL HIGH (ref 4.0–10.5)

## 2016-09-21 LAB — I-STAT TROPONIN, ED: TROPONIN I, POC: 0.03 ng/mL (ref 0.00–0.08)

## 2016-09-21 LAB — URINALYSIS, ROUTINE W REFLEX MICROSCOPIC
Bilirubin Urine: NEGATIVE
Glucose, UA: 250 mg/dL — AB
Ketones, ur: NEGATIVE mg/dL
LEUKOCYTES UA: NEGATIVE
NITRITE: NEGATIVE
Protein, ur: >300 mg/dL — AB
SPECIFIC GRAVITY, URINE: 1.009 (ref 1.005–1.030)
pH: 6.5 (ref 5.0–8.0)

## 2016-09-21 MED ORDER — INSULIN ASPART 100 UNIT/ML ~~LOC~~ SOLN
6.0000 [IU] | Freq: Once | SUBCUTANEOUS | Status: AC
Start: 2016-09-21 — End: 2016-09-21
  Administered 2016-09-21: 6 [IU] via INTRAVENOUS
  Filled 2016-09-21: qty 1

## 2016-09-21 MED ORDER — SODIUM CHLORIDE 0.9 % IV BOLUS (SEPSIS)
500.0000 mL | Freq: Once | INTRAVENOUS | Status: AC
  Administered 2016-09-21: 500 mL via INTRAVENOUS

## 2016-09-21 NOTE — ED Triage Notes (Signed)
Pt brought in by daughter with c/o fall 3 hours ago and dizziness last night. Pt states she tripped over her feet today falling onto right hip. Pt reports pain when bearing weight on right leg. Dizziness last night lasted only a few minutes. Pt was lying down when dizziness started. Denies any dizziness today.

## 2016-09-22 ENCOUNTER — Inpatient Hospital Stay (HOSPITAL_COMMUNITY): Payer: PPO

## 2016-09-22 ENCOUNTER — Emergency Department (HOSPITAL_COMMUNITY): Payer: PPO

## 2016-09-22 ENCOUNTER — Encounter (HOSPITAL_COMMUNITY): Payer: Self-pay | Admitting: Family Medicine

## 2016-09-22 DIAGNOSIS — Z8673 Personal history of transient ischemic attack (TIA), and cerebral infarction without residual deficits: Secondary | ICD-10-CM | POA: Diagnosis not present

## 2016-09-22 DIAGNOSIS — T82898A Other specified complication of vascular prosthetic devices, implants and grafts, initial encounter: Secondary | ICD-10-CM | POA: Diagnosis present

## 2016-09-22 DIAGNOSIS — N183 Chronic kidney disease, stage 3 (moderate): Secondary | ICD-10-CM | POA: Diagnosis present

## 2016-09-22 DIAGNOSIS — I6339 Cerebral infarction due to thrombosis of other cerebral artery: Secondary | ICD-10-CM | POA: Diagnosis not present

## 2016-09-22 DIAGNOSIS — I2510 Atherosclerotic heart disease of native coronary artery without angina pectoris: Secondary | ICD-10-CM | POA: Diagnosis not present

## 2016-09-22 DIAGNOSIS — I6523 Occlusion and stenosis of bilateral carotid arteries: Secondary | ICD-10-CM | POA: Diagnosis not present

## 2016-09-22 DIAGNOSIS — E1165 Type 2 diabetes mellitus with hyperglycemia: Secondary | ICD-10-CM | POA: Diagnosis present

## 2016-09-22 DIAGNOSIS — G459 Transient cerebral ischemic attack, unspecified: Secondary | ICD-10-CM | POA: Diagnosis present

## 2016-09-22 DIAGNOSIS — D518 Other vitamin B12 deficiency anemias: Secondary | ICD-10-CM

## 2016-09-22 DIAGNOSIS — E1122 Type 2 diabetes mellitus with diabetic chronic kidney disease: Secondary | ICD-10-CM | POA: Diagnosis present

## 2016-09-22 DIAGNOSIS — E785 Hyperlipidemia, unspecified: Secondary | ICD-10-CM | POA: Diagnosis present

## 2016-09-22 DIAGNOSIS — R2681 Unsteadiness on feet: Secondary | ICD-10-CM | POA: Diagnosis not present

## 2016-09-22 DIAGNOSIS — R4702 Dysphasia: Secondary | ICD-10-CM | POA: Diagnosis present

## 2016-09-22 DIAGNOSIS — D72829 Elevated white blood cell count, unspecified: Secondary | ICD-10-CM | POA: Diagnosis present

## 2016-09-22 DIAGNOSIS — Z823 Family history of stroke: Secondary | ICD-10-CM | POA: Diagnosis not present

## 2016-09-22 DIAGNOSIS — N289 Disorder of kidney and ureter, unspecified: Secondary | ICD-10-CM

## 2016-09-22 DIAGNOSIS — I10 Essential (primary) hypertension: Secondary | ICD-10-CM | POA: Diagnosis not present

## 2016-09-22 DIAGNOSIS — I671 Cerebral aneurysm, nonruptured: Secondary | ICD-10-CM | POA: Diagnosis present

## 2016-09-22 DIAGNOSIS — I130 Hypertensive heart and chronic kidney disease with heart failure and stage 1 through stage 4 chronic kidney disease, or unspecified chronic kidney disease: Secondary | ICD-10-CM | POA: Diagnosis present

## 2016-09-22 DIAGNOSIS — I639 Cerebral infarction, unspecified: Secondary | ICD-10-CM | POA: Diagnosis present

## 2016-09-22 DIAGNOSIS — G458 Other transient cerebral ischemic attacks and related syndromes: Secondary | ICD-10-CM | POA: Diagnosis present

## 2016-09-22 DIAGNOSIS — Y832 Surgical operation with anastomosis, bypass or graft as the cause of abnormal reaction of the patient, or of later complication, without mention of misadventure at the time of the procedure: Secondary | ICD-10-CM | POA: Diagnosis present

## 2016-09-22 DIAGNOSIS — I4581 Long QT syndrome: Secondary | ICD-10-CM | POA: Diagnosis present

## 2016-09-22 DIAGNOSIS — Z79899 Other long term (current) drug therapy: Secondary | ICD-10-CM | POA: Diagnosis not present

## 2016-09-22 DIAGNOSIS — I6529 Occlusion and stenosis of unspecified carotid artery: Secondary | ICD-10-CM | POA: Diagnosis not present

## 2016-09-22 DIAGNOSIS — E119 Type 2 diabetes mellitus without complications: Secondary | ICD-10-CM | POA: Diagnosis not present

## 2016-09-22 DIAGNOSIS — W010XXA Fall on same level from slipping, tripping and stumbling without subsequent striking against object, initial encounter: Secondary | ICD-10-CM | POA: Diagnosis present

## 2016-09-22 DIAGNOSIS — I214 Non-ST elevation (NSTEMI) myocardial infarction: Secondary | ICD-10-CM | POA: Diagnosis not present

## 2016-09-22 DIAGNOSIS — I119 Hypertensive heart disease without heart failure: Secondary | ICD-10-CM | POA: Diagnosis not present

## 2016-09-22 DIAGNOSIS — Z7982 Long term (current) use of aspirin: Secondary | ICD-10-CM | POA: Diagnosis not present

## 2016-09-22 DIAGNOSIS — Z794 Long term (current) use of insulin: Secondary | ICD-10-CM | POA: Diagnosis not present

## 2016-09-22 DIAGNOSIS — G450 Vertebro-basilar artery syndrome: Secondary | ICD-10-CM | POA: Diagnosis not present

## 2016-09-22 DIAGNOSIS — R4701 Aphasia: Secondary | ICD-10-CM | POA: Diagnosis present

## 2016-09-22 DIAGNOSIS — D519 Vitamin B12 deficiency anemia, unspecified: Secondary | ICD-10-CM | POA: Diagnosis present

## 2016-09-22 DIAGNOSIS — I5033 Acute on chronic diastolic (congestive) heart failure: Secondary | ICD-10-CM | POA: Diagnosis not present

## 2016-09-22 LAB — GLUCOSE, CAPILLARY
Glucose-Capillary: 120 mg/dL — ABNORMAL HIGH (ref 65–99)
Glucose-Capillary: 189 mg/dL — ABNORMAL HIGH (ref 65–99)
Glucose-Capillary: 88 mg/dL (ref 65–99)

## 2016-09-22 LAB — CBG MONITORING, ED
GLUCOSE-CAPILLARY: 206 mg/dL — AB (ref 65–99)
GLUCOSE-CAPILLARY: 245 mg/dL — AB (ref 65–99)

## 2016-09-22 LAB — CBC
HEMATOCRIT: 29.8 % — AB (ref 36.0–46.0)
Hemoglobin: 9.6 g/dL — ABNORMAL LOW (ref 12.0–15.0)
MCH: 26.5 pg (ref 26.0–34.0)
MCHC: 32.2 g/dL (ref 30.0–36.0)
MCV: 82.3 fL (ref 78.0–100.0)
Platelets: 313 10*3/uL (ref 150–400)
RBC: 3.62 MIL/uL — ABNORMAL LOW (ref 3.87–5.11)
RDW: 14.5 % (ref 11.5–15.5)
WBC: 11.9 10*3/uL — AB (ref 4.0–10.5)

## 2016-09-22 LAB — BASIC METABOLIC PANEL
ANION GAP: 9 (ref 5–15)
BUN: 17 mg/dL (ref 6–20)
CALCIUM: 8.6 mg/dL — AB (ref 8.9–10.3)
CO2: 23 mmol/L (ref 22–32)
CREATININE: 1.55 mg/dL — AB (ref 0.44–1.00)
Chloride: 104 mmol/L (ref 101–111)
GFR calc Af Amer: 36 mL/min — ABNORMAL LOW (ref >60)
GFR calc non Af Amer: 31 mL/min — ABNORMAL LOW (ref >60)
GLUCOSE: 207 mg/dL — AB (ref 65–99)
Potassium: 3.6 mmol/L (ref 3.5–5.1)
Sodium: 136 mmol/L (ref 135–145)

## 2016-09-22 LAB — LIPID PANEL
CHOL/HDL RATIO: 3.8 ratio
Cholesterol: 142 mg/dL (ref 0–200)
HDL: 37 mg/dL — AB (ref >40)
LDL CALC: 79 mg/dL (ref 0–99)
TRIGLYCERIDES: 131 mg/dL (ref <150)
VLDL: 26 mg/dL (ref 0–40)

## 2016-09-22 LAB — VITAMIN B12: Vitamin B-12: 382 pg/mL (ref 180–914)

## 2016-09-22 LAB — PROTIME-INR
INR: 1.05
Prothrombin Time: 13.8 seconds (ref 11.4–15.2)

## 2016-09-22 LAB — TSH: TSH: 4.663 u[IU]/mL — ABNORMAL HIGH (ref 0.350–4.500)

## 2016-09-22 MED ORDER — ATORVASTATIN CALCIUM 40 MG PO TABS: 40.0000 mg | ORAL_TABLET | Freq: Every day | ORAL | Status: DC

## 2016-09-22 MED ORDER — PANTOPRAZOLE SODIUM 20 MG PO TBEC
20.0000 mg | DELAYED_RELEASE_TABLET | Freq: Every day | ORAL | Status: DC
  Administered 2016-09-23 – 2016-09-26 (×4): 20 mg via ORAL
  Filled 2016-09-22 (×5): qty 1

## 2016-09-22 MED ORDER — VITAMIN B-12 1000 MCG PO TABS
2000.0000 ug | ORAL_TABLET | Freq: Every day | ORAL | Status: DC
  Administered 2016-09-23 – 2016-09-26 (×4): 2000 ug via ORAL
  Filled 2016-09-22 (×5): qty 2

## 2016-09-22 MED ORDER — ASPIRIN 325 MG PO TABS
325.0000 mg | ORAL_TABLET | Freq: Every day | ORAL | Status: DC
  Administered 2016-09-22 – 2016-09-24 (×3): 325 mg via ORAL
  Filled 2016-09-22 (×4): qty 1

## 2016-09-22 MED ORDER — ATORVASTATIN CALCIUM 80 MG PO TABS: 80.0000 mg | ORAL_TABLET | Freq: Every day | ORAL | Status: DC

## 2016-09-22 MED ORDER — ADULT MULTIVITAMIN W/MINERALS CH
1.0000 | ORAL_TABLET | Freq: Every day | ORAL | Status: DC
  Administered 2016-09-23 – 2016-09-26 (×4): 1 via ORAL
  Filled 2016-09-22 (×4): qty 1

## 2016-09-22 MED ORDER — SODIUM CHLORIDE 0.9 % IV SOLN
INTRAVENOUS | Status: AC
  Administered 2016-09-22: 06:00:00 via INTRAVENOUS

## 2016-09-22 MED ORDER — SENNOSIDES-DOCUSATE SODIUM 8.6-50 MG PO TABS
1.0000 | ORAL_TABLET | Freq: Every evening | ORAL | Status: DC | PRN
  Filled 2016-09-22: qty 1

## 2016-09-22 MED ORDER — INSULIN GLARGINE 100 UNIT/ML ~~LOC~~ SOLN
10.0000 [IU] | Freq: Every day | SUBCUTANEOUS | Status: DC
  Administered 2016-09-22 – 2016-09-23 (×2): 10 [IU] via SUBCUTANEOUS
  Filled 2016-09-22 (×2): qty 0.1

## 2016-09-22 MED ORDER — SODIUM CHLORIDE 0.9 % IV SOLN
INTRAVENOUS | Status: AC
  Administered 2016-09-22: 18:00:00 via INTRAVENOUS
  Filled 2016-09-22: qty 1000

## 2016-09-22 MED ORDER — STROKE: EARLY STAGES OF RECOVERY BOOK
Freq: Once | Status: AC
  Administered 2016-09-22: 12:00:00
  Filled 2016-09-22: qty 1

## 2016-09-22 MED ORDER — METOCLOPRAMIDE HCL 10 MG PO TABS
5.0000 mg | ORAL_TABLET | Freq: Four times a day (QID) | ORAL | Status: DC | PRN
  Administered 2016-09-26: 5 mg via ORAL
  Filled 2016-09-22: qty 1

## 2016-09-22 MED ORDER — ASPIRIN 300 MG RE SUPP: 300.0000 mg | Freq: Every day | RECTAL | Status: DC

## 2016-09-22 MED ORDER — INSULIN ASPART 100 UNIT/ML ~~LOC~~ SOLN
0.0000 [IU] | Freq: Three times a day (TID) | SUBCUTANEOUS | Status: DC
  Administered 2016-09-22: 5 [IU] via SUBCUTANEOUS
  Administered 2016-09-22 – 2016-09-23 (×2): 3 [IU] via SUBCUTANEOUS
  Administered 2016-09-23: 2 [IU] via SUBCUTANEOUS
  Administered 2016-09-23: 3 [IU] via SUBCUTANEOUS
  Administered 2016-09-24: 8 [IU] via SUBCUTANEOUS
  Administered 2016-09-24: 11 [IU] via SUBCUTANEOUS
  Administered 2016-09-24: 3 [IU] via SUBCUTANEOUS
  Administered 2016-09-25: 2 [IU] via SUBCUTANEOUS
  Administered 2016-09-25 – 2016-09-26 (×2): 5 [IU] via SUBCUTANEOUS
  Administered 2016-09-26: 2 [IU] via SUBCUTANEOUS

## 2016-09-22 MED ORDER — FAMOTIDINE 20 MG PO TABS
20.0000 mg | ORAL_TABLET | Freq: Once | ORAL | Status: AC
  Administered 2016-09-22: 20 mg via ORAL
  Filled 2016-09-22: qty 1

## 2016-09-22 MED ORDER — INSULIN ASPART 100 UNIT/ML ~~LOC~~ SOLN
0.0000 [IU] | Freq: Every day | SUBCUTANEOUS | Status: DC
  Administered 2016-09-23: 3 [IU] via SUBCUTANEOUS

## 2016-09-22 MED ORDER — HEPARIN SODIUM (PORCINE) 5000 UNIT/ML IJ SOLN
5000.0000 [IU] | Freq: Three times a day (TID) | INTRAMUSCULAR | Status: DC
  Administered 2016-09-22 – 2016-09-24 (×8): 5000 [IU] via SUBCUTANEOUS
  Filled 2016-09-22 (×8): qty 1

## 2016-09-22 MED ORDER — ATORVASTATIN CALCIUM 40 MG PO TABS
40.0000 mg | ORAL_TABLET | Freq: Every day | ORAL | Status: DC
Start: 2016-09-23 — End: 2016-09-25
  Administered 2016-09-22 – 2016-09-24 (×3): 40 mg via ORAL
  Filled 2016-09-22 (×3): qty 1

## 2016-09-22 NOTE — Progress Notes (Addendum)
PROGRESS NOTE  Gwendolyn Wallace ZOX:[id number] DOB: [birth year] DOA: 09/21/2016 PCP: Dwana MelenaZack Hall, MD  Brief History:   80 y.o. female with medical history significant for hypertension, insulin-dependent diabetes mellitus, coronary artery disease status post CABG, and bilateral carotid artery stenosis who presents to the emergency department after a fall and reports transient dysarthria, dysphasia and dizziness on the evening of 09/21/16. [name] daughter noted that the patient's speech was "babbling", and the patient had word finding difficulties at home. The episode lasted approximately 5 minutes. In addition, the patient also reports also for balance resulting in a fall which resulted in left hip pain. There was no loss of consciousness. Since her mechanical fall, the patient has reported some weakness in her left leg.  CT of the brain was unremarkable. MRI of the brain showed nonspecific left brachium pontis lesion. The patient was admitted for further neurologic workup. Neurology was consulted to assist with management.  Assessment/Plan: Dysphasia, dysarthria -concerned about TIA vs brain mass -09/22/16--case discussed with Dr. Shela CommonsJ Xu-->MRI brain WITH gad if renal function improves with IVF -Appreciate Neurology Consult -PT/OT evaluation -CT brain--neg -MRI brain--nonspecific lesion left brachium pontis -MRA brain--not performed -Carotid Duplex--not ordered--defer to neurology -Echo--pending -LDL--79-->lipitor increased to 80 mg -HbA1C--pending -Antiplatelet--ASA 325 mg  Carotid artery stenosis  - Ultrasound from April 2016 features bilateral stenosis of 50-70%  - Per report of pt's daughter, she was to follow-up with vascular surgery after the ultrasound but never did  - Will defer to neurology regarding imaging modality   CAD  - Status-post CABG in 2000  - No anginal complaints on admission  - Continue ASA and Lipitor;  -metoprolol and lisinopril held at admission       Insulin-dependent DM - HbA1c--pending  - Managed at home with metformin 1500 mg qD and Tresiba 10 units qD; these will be held while in hospital  - Start with Lantus 10 units qD and moderate-intensity sliding-scale correctional   Hypertension  - Managed with Norvasc, lisinopril, HCTZ, and metoprolol at home;  -held on admission given concern for stroke - Resume antihypertensives as appropriate    Kidney disease of uncertain chronicity  - Baseline SCr  1.5-1.7 - SCr was 0.76 in 2013, the most recent prior value available  - hold lisinopril -continue IVF  Normocytic anemia  - Hgb 11.3 on admission - Baseline Hgb 12-13 - Attributed to B12-deficiency, continue supplementation  Leukocytosis - WBC 12,600 on admission without fever or apparent focus of infection  - Appears to be chronic going back years  - Culture if febrile- remains hemodynamically stable - ?low grade CLL   Disposition Plan:   Home in 1-2 days  Family Communication:   Daughters updated at bedside 10/14--Total time spent 35 minutes.  Greater than 50% spent face to face counseling and coordinating care.  [id number]  Consultants: Neurology   Code Status:  FULL   DVT Prophylaxis:  [place] Heparin    Procedures: As Listed in Progress Note Above  Antibiotics: None    Subjective: Patient denies fevers, chills, headache, chest pain, dyspnea, nausea, vomiting, diarrhea, abdominal pain, dysuria, hematuria, hematochezia, and melena. Patient complains of slight left thigh pain and hip pain.   Objective: Vitals:   09/22/16 0700 09/22/16 0802 09/22/16 1126 09/22/16 1403  BP: 112/68 130/83 (!) 172/64 (!) 166/125  Pulse: 85 79 76 76  Resp: 17 18 18 19   Temp:  98.5 F (36.9 C) 98.3 F (36.8 C) 98.1  F (36.7 C)  TempSrc:  Oral Oral Oral  SpO2: 97% 93% 98% 98%    Intake/Output Summary (Last 24 hours) at 09/22/16 1711 Last data filed at 09/22/16 0118  Gross per 24 hour  Intake              500 ml   Output                0 ml  Net              500 ml   Weight change:  Exam:   General:  Pt is alert, follows commands appropriately, not in acute distress  HEENT: No icterus, No thrush, No neck mass, [place]/AT  Cardiovascular: RRR, S1/S2, no rubs, no gallops  Respiratory: CTA bilaterally, no wheezing, no crackles, no rhonchi  Abdomen: Soft/+BS, non tender, non distended, no guarding  Extremities: No edema, No lymphangitis, No petechiae, No rashes, no synovitis   Data Reviewed: I have personally reviewed following labs and imaging studies Basic Metabolic Panel:  Recent Labs Lab 09/21/16 1809 09/22/16 1013  NA 135 136  K 4.6 3.6  CL 99* 104  CO2 25 23  GLUCOSE 349* 207*  BUN 19 17  CREATININE 1.66* 1.55*  CALCIUM 9.0 8.6*   Liver Function Tests: No results for input(s): AST, ALT, ALKPHOS, BILITOT, PROT, ALBUMIN in the last 168 hours. No results for input(s): LIPASE, AMYLASE in the last 168 hours. No results for input(s): AMMONIA in the last 168 hours. Coagulation Profile:  Recent Labs Lab 09/22/16 0310  INR 1.05   CBC:  Recent Labs Lab 09/21/16 1809 09/22/16 1013  WBC 12.6* 11.9*  HGB 11.3* 9.6*  HCT 35.1* 29.8*  MCV 82.8 82.3  PLT 395 313   Cardiac Enzymes: No results for input(s): CKTOTAL, CKMB, CKMBINDEX, TROPONINI in the last 168 hours. BNP: Invalid input(s): POCBNP CBG:  Recent Labs Lab 09/21/16 2304 09/22/16 0334 09/22/16 0730 09/22/16 1151 09/22/16 1606  GLUCAP 316* 206* 245* 120* 189*   HbA1C: No results for input(s): HGBA1C in the last 72 hours. Urine analysis:    Component Value Date/Time   COLORURINE YELLOW 09/21/2016 1815   APPEARANCEUR CLEAR 09/21/2016 1815   LABSPEC 1.009 09/21/2016 1815   PHURINE 6.5 09/21/2016 1815   GLUCOSEU 250 (A) 09/21/2016 1815   HGBUR SMALL (A) 09/21/2016 1815   BILIRUBINUR NEGATIVE 09/21/2016 1815   KETONESUR NEGATIVE 09/21/2016 1815   PROTEINUR >300 (A) 09/21/2016 1815   NITRITE NEGATIVE  09/21/2016 1815   LEUKOCYTESUR NEGATIVE 09/21/2016 1815   Sepsis Labs: @LABRCNTIP (procalcitonin:4,lacticidven:4) )No results found for this or any previous visit (from the past 240 hour(s)).   Scheduled Meds: . aspirin  300 mg Rectal Daily   Or  . aspirin  325 mg Oral Daily  . atorvastatin  80 mg Oral Daily  . heparin  5,000 Units Subcutaneous Q8H  . insulin aspart  0-15 Units Subcutaneous TID WC  . insulin aspart  0-5 Units Subcutaneous QHS  . insulin glargine  10 Units Subcutaneous Daily  . multivitamin with minerals  1 tablet Oral Daily  . pantoprazole  20 mg Oral Daily  . cyanocobalamin  2,000 mcg Oral Daily   Continuous Infusions:   Procedures/Studies: Dg Chest 2 View  Result Date: 09/22/2016 CLINICAL DATA:  80 y/o F; status post fall 3 hours ago with dizziness falling onto right hip. EXAM: CHEST  2 VIEW COMPARISON:  07/13/2016 chest radiograph FINDINGS: Stable cardiac silhouette within normal limits. Status post CABG with sternotomy wires  aligned and intact. No focal consolidation. No pneumothorax or pleural effusion. No acute osseous abnormality is identified. IMPRESSION: No active cardiopulmonary disease. Electronically Signed   By: Mitzi Hansen M.D.   On: 09/22/2016 04:50   Mr Brain Wo Contrast  Result Date: 09/22/2016 CLINICAL DATA:  80 y/o F; episode of dizziness and headaches accompanied by dysarthria lasting 5 minutes. EXAM: MRI HEAD WITHOUT CONTRAST TECHNIQUE: Multiplanar, multiecho pulse sequences of the brain and surrounding structures were obtained without intravenous contrast. COMPARISON:  None. FINDINGS: Brain: No acute infarction, hemorrhage, hydrocephalus, extra-axial collection or mass lesion.Moderate chronic microvascular ischemic changes and parenchymal volume loss of the brain. 13 mm diffusion hyperintense, T2 hyperintense, T1 hypointense focus within the left brachium pontis without low ADC (series 5, image 14). Advanced chronic microvascular  ischemic changes and parenchymal volume loss of the brain. Vascular: Normal flow voids. Skull and upper cervical spine: Normal marrow signal. Sinuses/Orbits: Complete opacification of left maxillary sinus, left anterior ethmoid air cells, and left frontal sinus, the left middle meatus obstructive pattern. Moderate mucosal thickening of the right maxillary sinus. Partial opacification of left mastoid tip. Orbits are grossly unremarkable. Other: None. IMPRESSION: Motion degradation of several sequences. 1. Nonspecific left brachium pontis lesion without diffusion restriction may represent a late subacute infarct, focus of demyelination, and mass is not excluded. Consider contrast CT or MRI for further characterization. 2. No acute intracranial abnormality identified. 3. Advanced chronic microvascular ischemic changes and parenchymal volume loss. Electronically Signed   By: Mitzi Hansen M.D.   On: 09/22/2016 02:46   Dg Hip Unilat W Or Wo Pelvis 2-3 Views Left  Result Date: 09/21/2016 CLINICAL DATA:  Status post fall on left side, with left hip pain. Initial encounter. EXAM: DG HIP (WITH OR WITHOUT PELVIS) 2-3V LEFT COMPARISON:  None. FINDINGS: There is no evidence of fracture or dislocation. The left femoral head remains seated at the acetabulum. The visualized portions of the left sacroiliac joint are grossly unremarkable. Diffuse vascular calcifications are seen. The visualized bowel gas pattern is unremarkable. IMPRESSION: No evidence of fracture or dislocation. Diffuse vascular calcifications seen. Electronically Signed   By: Roanna Raider M.D.   On: 09/21/2016 18:55   Dg Hip Unilat W Or Wo Pelvis 2-3 Views Right  Result Date: 09/21/2016 CLINICAL DATA:  Initial evaluation for acute trauma, fall. EXAM: DG HIP (WITH OR WITHOUT PELVIS) 2-3V RIGHT COMPARISON:  None available. FINDINGS: No acute fracture or dislocation. Femoral heads in normal alignment with the acetabula. Femoral head heights  preserved. Bony pelvis intact. SI joints approximated. No pubic diastasis. Moderate degenerative osteoarthritic changes about the hips bilaterally. Prominent degenerative changes noted within the lower lumbar spine. Diffuse osteopenia. Prominent vascular calcifications noted. Surgical clips overlie the right inguinal region. IMPRESSION: 1. No acute fracture or dislocation about the right hip. 2. Osteopenia. 3. Atherosclerosis. Electronically Signed   By: Rise Mu M.D.   On: 09/21/2016 18:52    Otillia Cordone, DO  [facility] Pager [phone]  If 7PM-7AM, please contact night-coverage [email or web address] Password [email or web address] 09/22/2016, 5:11 PM   LOS: 0 days

## 2016-09-22 NOTE — H&P (Signed)
History and Physical    Gwendolyn Wallace FRT:[id number] DOB: Jul 23, 1936 DOA: 09/21/2016  PCP: Wende Neighbors, MD   Patient coming from: Home  Chief Complaint: Dizziness, speech difficulty, fall   HPI: Gwendolyn Wallace is a 80 y.o. female with medical history significant for hypertension, insulin-dependent diabetes mellitus, coronary artery disease status post CABG, and bilateral carotid artery stenosis who presents to the emergency department after a fall and reports transient dysarthria and dizziness the night prior. Patient is accompanied by HER-2 daughters who assist with the history. She had reportedly been in her usual state of health until, while lying in bed the night of 09/20/2016, experienced an acute episode of dizziness with a mild headache. She brought this to the attention of one of her daughters who described her speech as "babbling." Patient was reportedly not slurring words, but was apparently struggling to find the right words. This episode reportedly resolved over the course of about 5 minutes and the patient reports returning to her usual state of health until just prior to arrival when she lost balance and fell. Patient reports attempting to turn, catching her foot and tripping. She denies any dizziness or lightheadedness during that episode. She had immediate pain at the right hip after the fall, but denies striking her head or losing consciousness.  ED Course: Upon arrival to the ED, patient is found to be afebrile, saturating well on room air, and with vital signs stable. EKG demonstrates sinus rhythm with PAC and LVH with secondary repolarization abnormality. Chemistry panel is notable for a serum creatinine 1.66, up from 0.76 back in 2013. Serum glucose is elevated to 350. CBC features a chronic leukocytosis with white count of 12,600 and a normocytic anemia with hemoglobin of 11.3. Urinalysis features microscopic hematuria, proteinuria, and glucosuria, but is not suggestive of  infection. Radiographs of the right hip are negative for acute fracture or dislocation. MRI of the brain was obtained without contrast, and while there are no acute intracranial abnormalities, there is a nonspecific lesion in the left brachium pontis which may reflect a late subacute infarct, focus of demyelination, or even a mass. Patient was treated with 6 units of NovoLog, Pepcid, and 500 mL of normal saline. Neurology was consulted by the ED physician. Patient will be admitted to the telemetry unit for ongoing evaluation and management of transient dizziness and expressive aphasia with nonspecific cerebellar lesion and known bilateral carotid artery stenosis.  Review of Systems:  All other systems reviewed and apart from HPI, are negative.  Past Medical History:  Diagnosis Date  . Carotid stenosis, bilateral 08/07/05   bilat 40-60%  . CVA (cerebral infarction) 07/10/02  . Diabetes mellitus without complication ([place])   . Gastric bleeding 10/01  . Herpes zoster 4/01  . Hypertension     Past Surgical History:  Procedure Laterality Date  . CATARACT EXTRACTION, BILATERAL  07/30/2006  . CORONARY ARTERY BYPASS GRAFT  03/11/1999  . PANRETINAL PHOTOCOAGULATION  07/10/2002  . PANRETINAL PHOTOCOAGULATION  4/05     reports that she has been smoking Cigarettes.  She has been smoking about 0.25 packs per day. She has never used smokeless tobacco. She reports that she does not drink alcohol or use drugs.  Allergies  Allergen Reactions  . Gemfibrozil Other (See Comments)    MYALGIA  . Trulicity [Dulaglutide] Nausea And Vomiting    EXTREME N & V AND LETHARGY    Family History  Problem Relation Age of Onset  . Stroke Brother   .  Diabetes Mother   . Diabetes Son   . Diabetes Brother   . Heart attack Brother   . Cholecystitis Daughter   . Cholecystitis Daughter   . Heart attack Father      Prior to Admission medications   Medication Sig Start Date End Date Taking? Authorizing Provider    acetaminophen (TYLENOL) 650 MG CR tablet Take 650 mg by mouth every 8 (eight) hours as needed.   Yes Historical Provider, MD  amLODipine (NORVASC) 10 MG tablet TAKE ONE TABLET ONCE DAILY 01/09/13  Yes Candelaria Celeste, MD  aspirin ([employer] ASPIRIN) 81 MG chewable tablet Chew 81 mg by mouth daily.     Yes Historical Provider, MD  atorvastatin (LIPITOR) 40 MG tablet Take 1 tablet (40 mg total) by mouth daily. 08/26/12  Yes Candelaria Celeste, MD  calcium carbonate (TUMS E-X 750) 750 MG chewable tablet Chew 1 tablet by mouth 2 (two) times daily as needed for heartburn.    Yes Historical Provider, MD  cyanocobalamin 2000 MCG tablet Take 2,000 mcg by mouth daily.     Yes Candelaria Celeste, MD  insulin degludec (TRESIBA FLEXTOUCH) 100 UNIT/ML SOPN FlexTouch Pen Inject 10 Units into the skin daily.   Yes Historical Provider, MD  lisinopril-hydrochlorothiazide (PRINZIDE,ZESTORETIC) 20-12.5 MG per tablet TAKE ONE TABLET TWICE DAILY 09/08/13  Yes Lupita Dawn, MD  metFORMIN (GLUCOPHAGE) 1000 MG tablet TAKE ONE AND ONE-HALF TABLET EVERY MORNING AND ONE TABLET AT SUPPER 10/05/13   Lupita Dawn, MD  metoclopramide (REGLAN) 5 MG tablet Take 5 mg by mouth 4 (four) times daily as needed. Before meals and at bedtime    Historical Provider, MD  metoprolol (LOPRESSOR) 50 MG tablet TAKE ONE TABLET TWICE DAILY 10/02/12   Candelaria Celeste, MD  Multiple Vitamin (MULTIVITAMIN) capsule Take 1 capsule by mouth daily.      Historical Provider, MD  nitroGLYCERIN (NITROSTAT) 0.4 MG SL tablet Place 1 tablet under tongue as directed     Historical Provider, MD  ondansetron (ZOFRAN) 4 MG tablet Take 1 tablet (4 mg total) by mouth every 6 (six) hours. 07/13/16   Nat Christen, MD  pantoprazole (PROTONIX) 20 MG tablet Take 1 tablet (20 mg total) by mouth daily. 07/13/16   Nat Christen, MD    Physical Exam: Vitals:   09/21/16 2230 09/21/16 2315 09/21/16 2345 09/22/16 0000  BP: 113/75 111/79 128/77 110/76  Pulse: 82 87 83 80  Resp: '24 22 21 26  ' Temp:       TempSrc:      SpO2: 96% 96% 96% 97%      Constitutional: NAD, calm, comfortable; very thin Eyes: PERTLA, lids and conjunctivae normal ENMT: Mucous membranes are moist. Posterior pharynx clear of any exudate or lesions.   Neck: normal, supple, no masses, no thyromegaly Respiratory: Mildly diminished bilaterally, no wheezing, no crackles. Normal respiratory effort.    Cardiovascular: S1 & S2 heard, regular rate and rhythm. No extremity edema. Right-sided carotid bruit. No significant JVD. Abdomen: No distension, no tenderness, no masses palpated. Bowel sounds normal.  Musculoskeletal: no clubbing / cyanosis. No joint deformity upper and lower extremities. Normal muscle tone.  Skin: no significant rashes, lesions, ulcers. Warm, dry, well-perfused. Neurologic: CN 2-12 grossly intact. Sensation intact, DTR normal. Strength 5/5 in all 4 limbs.  Psychiatric: Normal judgment and insight. Alert and oriented x 3. Normal mood and affect.     Labs on Admission: I have personally reviewed following labs and imaging studies  CBC:  Recent Labs Lab  09/21/16 1809  WBC 12.6*  HGB 11.3*  HCT 35.1*  MCV 82.8  PLT 408   Basic Metabolic Panel:  Recent Labs Lab 09/21/16 1809  NA 135  K 4.6  CL 99*  CO2 25  GLUCOSE 349*  BUN 19  CREATININE 1.66*  CALCIUM 9.0   GFR: CrCl cannot be calculated (Unknown ideal weight.). Liver Function Tests: No results for input(s): AST, ALT, ALKPHOS, BILITOT, PROT, ALBUMIN in the last 168 hours. No results for input(s): LIPASE, AMYLASE in the last 168 hours. No results for input(s): AMMONIA in the last 168 hours. Coagulation Profile:  Recent Labs Lab 09/22/16 0310  INR 1.05   Cardiac Enzymes: No results for input(s): CKTOTAL, CKMB, CKMBINDEX, TROPONINI in the last 168 hours. BNP (last 3 results) No results for input(s): PROBNP in the last 8760 hours. HbA1C: No results for input(s): HGBA1C in the last 72 hours. CBG:  Recent Labs Lab  09/21/16 2304 09/22/16 0334  GLUCAP 316* 206*   Lipid Profile: No results for input(s): CHOL, HDL, LDLCALC, TRIG, CHOLHDL, LDLDIRECT in the last 72 hours. Thyroid Function Tests: No results for input(s): TSH, T4TOTAL, FREET4, T3FREE, THYROIDAB in the last 72 hours. Anemia Panel: No results for input(s): VITAMINB12, FOLATE, FERRITIN, TIBC, IRON, RETICCTPCT in the last 72 hours. Urine analysis:    Component Value Date/Time   COLORURINE YELLOW 09/21/2016 1815   APPEARANCEUR CLEAR 09/21/2016 1815   LABSPEC 1.009 09/21/2016 1815   PHURINE 6.5 09/21/2016 1815   GLUCOSEU 250 (A) 09/21/2016 1815   HGBUR SMALL (A) 09/21/2016 1815   BILIRUBINUR NEGATIVE 09/21/2016 1815   KETONESUR NEGATIVE 09/21/2016 1815   PROTEINUR >300 (A) 09/21/2016 1815   NITRITE NEGATIVE 09/21/2016 1815   LEUKOCYTESUR NEGATIVE 09/21/2016 1815   Sepsis Labs: '@LABRCNTIP' (procalcitonin:4,lacticidven:4) )No results found for this or any previous visit (from the past 240 hour(s)).   Radiological Exams on Admission: Mr Brain Wo Contrast  Result Date: 09/22/2016 CLINICAL DATA:  80 y/o F; episode of dizziness and headaches accompanied by dysarthria lasting 5 minutes. EXAM: MRI HEAD WITHOUT CONTRAST TECHNIQUE: Multiplanar, multiecho pulse sequences of the brain and surrounding structures were obtained without intravenous contrast. COMPARISON:  None. FINDINGS: Brain: No acute infarction, hemorrhage, hydrocephalus, extra-axial collection or mass lesion.Moderate chronic microvascular ischemic changes and parenchymal volume loss of the brain. 13 mm diffusion hyperintense, T2 hyperintense, T1 hypointense focus within the left brachium pontis without low ADC (series 5, image 14). Advanced chronic microvascular ischemic changes and parenchymal volume loss of the brain. Vascular: Normal flow voids. Skull and upper cervical spine: Normal marrow signal. Sinuses/Orbits: Complete opacification of left maxillary sinus, left anterior ethmoid  air cells, and left frontal sinus, the left middle meatus obstructive pattern. Moderate mucosal thickening of the right maxillary sinus. Partial opacification of left mastoid tip. Orbits are grossly unremarkable. Other: None. IMPRESSION: Motion degradation of several sequences. 1. Nonspecific left brachium pontis lesion without diffusion restriction may represent a late subacute infarct, focus of demyelination, and mass is not excluded. Consider contrast CT or MRI for further characterization. 2. No acute intracranial abnormality identified. 3. Advanced chronic microvascular ischemic changes and parenchymal volume loss. Electronically Signed   By: Kristine Garbe M.D.   On: 09/22/2016 02:46   Dg Hip Unilat W Or Wo Pelvis 2-3 Views Left  Result Date: 09/21/2016 CLINICAL DATA:  Status post fall on left side, with left hip pain. Initial encounter. EXAM: DG HIP (WITH OR WITHOUT PELVIS) 2-3V LEFT COMPARISON:  None. FINDINGS: There is no evidence of fracture  or dislocation. The left femoral head remains seated at the acetabulum. The visualized portions of the left sacroiliac joint are grossly unremarkable. Diffuse vascular calcifications are seen. The visualized bowel gas pattern is unremarkable. IMPRESSION: No evidence of fracture or dislocation. Diffuse vascular calcifications seen. Electronically Signed   By: Garald Balding M.D.   On: 09/21/2016 18:55   Dg Hip Unilat W Or Wo Pelvis 2-3 Views Right  Result Date: 09/21/2016 CLINICAL DATA:  Initial evaluation for acute trauma, fall. EXAM: DG HIP (WITH OR WITHOUT PELVIS) 2-3V RIGHT COMPARISON:  None available. FINDINGS: No acute fracture or dislocation. Femoral heads in normal alignment with the acetabula. Femoral head heights preserved. Bony pelvis intact. SI joints approximated. No pubic diastasis. Moderate degenerative osteoarthritic changes about the hips bilaterally. Prominent degenerative changes noted within the lower lumbar spine. Diffuse  osteopenia. Prominent vascular calcifications noted. Surgical clips overlie the right inguinal region. IMPRESSION: 1. No acute fracture or dislocation about the right hip. 2. Osteopenia. 3. Atherosclerosis. Electronically Signed   By: Jeannine Boga M.D.   On: 09/21/2016 18:52    EKG: Independently reviewed. Sinus rhythm, PAC, LVH with secondary repolarization abnormality.   Assessment/Plan  1. Expressive aphasia, dizziness, fall   - Neurology is consulting and much appreciated  - Hx is concerning for cerebrovascular etiology and MRI features a non-specific lesion in the middle cerebellar peduncle, possibly reflective of late subacute infarct  - She has known bilateral CAS and there is concern for a vertebrobasilar insufficiency  - Lesion on MRI could also represent a demyelinating disease as they have propensity for this area, but seems less likely given the overall clinical picture; mass is also on the differential for the lesion; given marked hyperglycemia on admission, atypical RPLS a consideration  - tPA not considered as she is >24 out from the event and sxs are too mild  - Admit to telemetry unit, start ASA ppx, continue Lipitor (increased to 80 mg), VTE ppx with sq heparin  - NPO pending swallow eval; PT, OT, SLP evals requested  - Check fasting lipids and A1c in the am  - Control sugars, maintain normothermia  - TTE ordered and pending - May need CTA head & neck   - Will defer additional imaging to neurology; consult is pending   2. Carotid artery stenosis  - Ultrasound from April 2016 features bilateral stenosis of 50-70%  - Per report of pt's daughter, she was to follow-up with vascular surgery after the ultrasound but never did  - Will defer to neurology regarding imaging modality   3. CAD  - Status-post CABG in 2000  - No anginal complaints on admission  - Continue ASA and Lipitor; metoprolol and lisinopril held at admission    4. Insulin-dependent DM - No A1c on  file - Serum glucose 349 on admission; glucosuria noted  - Managed at home with metformin 1500 mg qD and Tresiba 10 units qD; these will be held while in hospital  - She was treated with 6 units of IV Novolog in ED  - Start with Lantus 10 units qD and moderate-intensity sliding-scale correctional  - Carb-modified diet as appropriate pending successful completion of swallow assessment   5. Hypertension  - Diastolic pressure a little high on admission  - Managed with Norvasc, lisinopril, HCTZ, and metoprolol at home; these are held on admission given concern for vertebrobasilar insufficiency  - Resume antihypertensives as appropriate    6. Kidney disease of uncertain chronicity  - SCr 1.66 on admission,  BUN 19  - SCr was 0.76 in 2013, the most recent prior value available  - Will provide a gentle IVF hydration overnight while holding lisinopril    7. Normocytic anemia  - Hgb 11.3 on admission, down from 12.7 in August 2017 - No s/s of active bleeding  - Attributed to B12-deficiency, continue supplementation  8. Leukocytosis - WBC 12,600 on admission without fever or apparent focus of infection  - Appears to be chronic going back years  - Culture if febrile   DVT prophylaxis: sq heparin  Code Status: Full  Family Communication: Daughters updated at bedside at patient's request Disposition Plan: Admit to telemetry Consults called: Neurology Admission status: Inpatient    Vianne Bulls, MD [facility] Pager [phone]  If 7PM-7AM, please contact night-coverage [email or web address] Password [email or web address]  09/22/2016, 3:59 AM

## 2016-09-22 NOTE — ED Provider Notes (Signed)
[facility]-EMERGENCY DEPT Provider Note   CSN: [id number] Arrival date & time: 09/21/16  1748     History   Chief Complaint Chief Complaint  Patient presents with  . Dizziness  . Headache    HPI Gwendolyn Wallace is a 80 y.o. female with a history of insulin dependent DM, history of prior CVA in 1999 with no residual deficit and bilateral carotid stenosis 50-70% most recently April 2016 presenting with an episode of dizziness and headache which was accompanied by dysarthria and lasting approximately 5 minutes yesterday evening, witnessed by dg at bedside who describes the event.  Her speech cleared completely and she endorses feeling ok today until she fell before arriving here, approx 4 pm.  She states she tripped over her feet, denies dizziness, confusion or focal weakness tonight which daughter concurs.  She did not hit her head during tonights fall and is not on blood thinners. She does have left upper thigh and hip pain from the fall only.  She has had no medications prior to arrival.  She has taken her Evaristo Bury around noon today and has had no PO intake this afternoon so cannot explain her elevated blood glucose tonight.  She has had no polyuria or polydipsia.   The history is provided by the patient and a relative.    Past Medical History:  Diagnosis Date  . Carotid stenosis, bilateral 08/07/05   bilat 40-60%  . CVA (cerebral infarction) 07/10/02  . Diabetes mellitus without complication (HCC)   . Gastric bleeding 10/01  . Herpes zoster 4/01  . Hypertension     Patient Active Problem List   Diagnosis Date Noted  . At high risk for falls 04/02/2013  . Osteoarthritis of hand, left 08/26/2012  . Other vitamin B12 deficiency anemia 12/05/2010  . Personal history of fall 11/08/2009  . DYSTROPHIC NAILS 02/24/2008  . Gastroparesis 05/27/2007  . PVD WITH CLAUDICATION 04/03/2007  . IMPAIRMENT, ONE EYE MODERATE, OTH NEAR-NORMAL 02/25/2007  . DIABETES MELLITUS, II, COMPLICATIONS  02/06/2007  . HYPERLIPIDEMIA 02/06/2007  . TOBACCO DEPENDENCE 02/06/2007  . HYPERTENSION, BENIGN SYSTEMIC 02/06/2007  . CORONARY, ARTERIOSCLEROSIS 02/06/2007  . CAROTID ARTERY NARROWING 02/06/2007  . PEPTIC ULCER DIS., UNSPEC. W/O OBSTRUCTION 02/06/2007  . INCONTINENCE, STRESS, FEMALE 02/06/2007  . Osteoporosis, unspecified 02/06/2007  . CEREBROVAS DIS, LATE EFFECTS (S/P CVA) 02/06/2007    Past Surgical History:  Procedure Laterality Date  . CATARACT EXTRACTION, BILATERAL  07/30/2006  . CORONARY ARTERY BYPASS GRAFT  03/11/1999  . PANRETINAL PHOTOCOAGULATION  07/10/2002  . PANRETINAL PHOTOCOAGULATION  4/05    OB History    No data available       Home Medications    Prior to Admission medications   Medication Sig Start Date End Date Taking? Authorizing Provider  acetaminophen (TYLENOL) 650 MG CR tablet Take 650 mg by mouth every 8 (eight) hours as needed.   Yes Historical Provider, MD  amLODipine (NORVASC) 10 MG tablet TAKE ONE TABLET ONCE DAILY 01/09/13  Yes Zachery Dauer, MD  aspirin (BAYER CHILDRENS ASPIRIN) 81 MG chewable tablet Chew 81 mg by mouth daily.     Yes Historical Provider, MD  atorvastatin (LIPITOR) 40 MG tablet Take 1 tablet (40 mg total) by mouth daily. 08/26/12  Yes Zachery Dauer, MD  calcium carbonate (TUMS E-X 750) 750 MG chewable tablet Chew 1 tablet by mouth 2 (two) times daily as needed for heartburn.    Yes Historical Provider, MD  cyanocobalamin 2000 MCG tablet Take 2,000 mcg by mouth daily.  Yes Zachery Dauer, MD  insulin degludec (TRESIBA FLEXTOUCH) 100 UNIT/ML SOPN FlexTouch Pen Inject 10 Units into the skin daily.   Yes Historical Provider, MD  lisinopril-hydrochlorothiazide (PRINZIDE,ZESTORETIC) 20-12.5 MG per tablet TAKE ONE TABLET TWICE DAILY 09/08/13  Yes Uvaldo Rising, MD  metFORMIN (GLUCOPHAGE) 1000 MG tablet TAKE ONE AND ONE-HALF TABLET EVERY MORNING AND ONE TABLET AT SUPPER 10/05/13   Uvaldo Rising, MD  metoclopramide (REGLAN) 5 MG tablet Take 5 mg by  mouth 4 (four) times daily as needed. Before meals and at bedtime    Historical Provider, MD  metoprolol (LOPRESSOR) 50 MG tablet TAKE ONE TABLET TWICE DAILY 10/02/12   Zachery Dauer, MD  Multiple Vitamin (MULTIVITAMIN) capsule Take 1 capsule by mouth daily.      Historical Provider, MD  nitroGLYCERIN (NITROSTAT) 0.4 MG SL tablet Place 1 tablet under tongue as directed     Historical Provider, MD  ondansetron (ZOFRAN) 4 MG tablet Take 1 tablet (4 mg total) by mouth every 6 (six) hours. 07/13/16   Donnetta Hutching, MD  pantoprazole (PROTONIX) 20 MG tablet Take 1 tablet (20 mg total) by mouth daily. 07/13/16   Donnetta Hutching, MD    Family History Family History  Problem Relation Age of Onset  . Stroke Brother   . Diabetes Mother   . Diabetes Son   . Diabetes Brother   . Heart attack Brother   . Cholecystitis Daughter   . Cholecystitis Daughter   . Heart attack Father     Social History Social History  Substance Use Topics  . Smoking status: Current Every Day Smoker    Packs/day: 0.25    Types: Cigarettes  . Smokeless tobacco: Not on file  . Alcohol use No     Allergies   Gemfibrozil and Trulicity [dulaglutide]   Review of Systems Review of Systems  Constitutional: Negative for fever.  HENT: Negative for congestion and sore throat.   Eyes: Negative.   Respiratory: Negative for chest tightness and shortness of breath.   Cardiovascular: Negative for chest pain.  Gastrointestinal: Negative for abdominal pain, nausea and vomiting.  Genitourinary: Negative.   Musculoskeletal: Positive for arthralgias. Negative for back pain, joint swelling and neck pain.  Skin: Negative.  Negative for rash and wound.  Neurological: Positive for dizziness, speech difficulty and headaches. Negative for weakness, light-headedness and numbness.  Psychiatric/Behavioral: Negative.      Physical Exam Updated Vital Signs BP 107/75 (BP Location: Left Arm)   Pulse 88   Temp 98.2 F (36.8 C) (Oral)   Resp 26    SpO2 94%   Physical Exam  Constitutional: She appears well-developed and well-nourished. No distress.  HENT:  Head: Normocephalic and atraumatic.  Eyes: Conjunctivae are normal.  Neck: Normal range of motion.  Cardiovascular: Normal rate, regular rhythm, normal heart sounds and intact distal pulses.   Pulmonary/Chest: Effort normal and breath sounds normal. She has no wheezes.  Abdominal: Soft. Bowel sounds are normal. There is no tenderness.  Musculoskeletal: Normal range of motion.  Neurological: She is alert. She has normal strength. No cranial nerve deficit or sensory deficit. She exhibits normal muscle tone.  Negative pronator drift. Equal grip strength.  Skin: Skin is warm and dry.  Psychiatric: She has a normal mood and affect.  Nursing note and vitals reviewed.    ED Treatments / Results  Labs (all labs ordered are listed, but only abnormal results are displayed) Labs Reviewed  BASIC METABOLIC PANEL - Abnormal; Notable for the following:  Result Value   Chloride 99 (*)    Glucose, Bld 349 (*)    Creatinine, Ser 1.66 (*)    GFR calc non Af Amer 28 (*)    GFR calc Af Amer 33 (*)    All other components within normal limits  CBC - Abnormal; Notable for the following:    WBC 12.6 (*)    Hemoglobin 11.3 (*)    HCT 35.1 (*)    All other components within normal limits  URINALYSIS, ROUTINE W REFLEX MICROSCOPIC (NOT AT [facility]) - Abnormal; Notable for the following:    Glucose, UA 250 (*)    Hgb urine dipstick SMALL (*)    Protein, ur >300 (*)    All other components within normal limits  URINE MICROSCOPIC-ADD ON - Abnormal; Notable for the following:    Squamous Epithelial / LPF 0-5 (*)    Bacteria, UA RARE (*)    All other components within normal limits  CBG MONITORING, ED - Abnormal; Notable for the following:    Glucose-Capillary 316 (*)    All other components within normal limits  I-STAT TROPOININ, ED    EKG  EKG Interpretation None        Radiology Dg Hip Unilat W Or Wo Pelvis 2-3 Views Left  Result Date: 09/21/2016 CLINICAL DATA:  Status post fall on left side, with left hip pain. Initial encounter. EXAM: DG HIP (WITH OR WITHOUT PELVIS) 2-3V LEFT COMPARISON:  None. FINDINGS: There is no evidence of fracture or dislocation. The left femoral head remains seated at the acetabulum. The visualized portions of the left sacroiliac joint are grossly unremarkable. Diffuse vascular calcifications are seen. The visualized bowel gas pattern is unremarkable. IMPRESSION: No evidence of fracture or dislocation. Diffuse vascular calcifications seen. Electronically Signed   By: Roanna RaiderJeffery  Chang M.D.   On: 09/21/2016 18:55   Dg Hip Unilat W Or Wo Pelvis 2-3 Views Right  Result Date: 09/21/2016 CLINICAL DATA:  Initial evaluation for acute trauma, fall. EXAM: DG HIP (WITH OR WITHOUT PELVIS) 2-3V RIGHT COMPARISON:  None available. FINDINGS: No acute fracture or dislocation. Femoral heads in normal alignment with the acetabula. Femoral head heights preserved. Bony pelvis intact. SI joints approximated. No pubic diastasis. Moderate degenerative osteoarthritic changes about the hips bilaterally. Prominent degenerative changes noted within the lower lumbar spine. Diffuse osteopenia. Prominent vascular calcifications noted. Surgical clips overlie the right inguinal region. IMPRESSION: 1. No acute fracture or dislocation about the right hip. 2. Osteopenia. 3. Atherosclerosis. Electronically Signed   By: Rise MuBenjamin  McClintock M.D.   On: 09/21/2016 18:52    Procedures Procedures (including critical care time)  Medications Ordered in ED Medications  sodium chloride 0.9 % bolus 500 mL (500 mLs Intravenous New Bag/Given 09/21/16 2355)  famotidine (PEPCID) tablet 20 mg (not administered)  insulin aspart (novoLOG) injection 6 Units (6 Units Intravenous Given 09/21/16 2354)     Initial Impression / Assessment and Plan / ED Course  I have reviewed the triage  vital signs and the nursing notes.  Pertinent labs & imaging results that were available during my care of the patient were reviewed by me and considered in my medical decision making (see chart for details).  Clinical Course    Pt with sx suggesting tia 24 hours ago with history of cva historically and known significant bilateral carotid stenosis.  Pt discussed with Dr. Nicanor AlconPalumbo who assumes care.  MRI pending.  She was given NS 500 cc plus insulin IV. cbg recheck also pending.  Final Clinical Impressions(s) / ED Diagnoses   Final diagnoses:  None    New Prescriptions New Prescriptions   No medications on file     Victoriano Lain 09/22/16 1610    April Palumbo, MD 09/22/16 0401

## 2016-09-22 NOTE — Progress Notes (Signed)
PT Cancellation Note  Patient Details Name: Blair HeysBessie M Eastland MRN: [id number] DOB: 1935/12/22   Cancelled Treatment:    Reason Eval/Treat Not Completed: Patient at procedure or test/unavailable (orders received, pt OTF, workup underway will follow)   Fabio AsaWerner, Mahala Rommel J 09/22/2016, 6:34 AM Charlotte Crumbevon Niyana Chesbro, PT DPT  [phone]

## 2016-09-22 NOTE — Consult Note (Signed)
Neurology Consult Note  Reason for Consultation: Possible TIA  Requesting provider: April Palumbo, MD  CC: Episode of inability to speak normally for about 5 minutes  HPI: This is a 80 year old right-handed woman who was brought to the emergency department by her daughter after she had a fall at home. Wounds as if she had an episode on 09/20/16 where she had an episode of difficulty speaking. She describes "babbling" and says that her daughter told her that sounded as if she was speaking [place]. She says that she cannot understand her own language. This lasted perhaps 5 minutes and then completely resolved. It does not sound as if she was slurring her speech. She describes associated dizziness and mild headache. After this episode, the patient returned to her normal state of health. However, just prior to presentation to the emergency department yesterday, she lost her balance and fell. This sounds as if it was a mechanical fall as the patient describes catching her foot and tripping. She's had some pain in the left leg since the fall.  The patient reports that she had a stroke approximately 17 years ago. She had similar difficulty with her language at the time of that stroke as well as weakness in her right arm. After completion of rehabilitation services, she reports little in the way of residual deficit. She says that she has known carotid stenosis bilaterally and this is been followed for several years. Neurology consultation is now requested for recommendations regarding TIA.  PMH:  Past Medical History:  Diagnosis Date  . Carotid stenosis, bilateral 08/07/05   bilat 40-60%  . CVA (cerebral infarction) 07/10/02  . Diabetes mellitus without complication (HCC)   . Gastric bleeding 10/01  . Herpes zoster 4/01  . Hypertension     PSH:  Past Surgical History:  Procedure Laterality Date  . CATARACT EXTRACTION, BILATERAL  07/30/2006  . CORONARY ARTERY BYPASS GRAFT  03/11/1999  . PANRETINAL  PHOTOCOAGULATION  07/10/2002  . PANRETINAL PHOTOCOAGULATION  4/05    Family history: Family History  Problem Relation Age of Onset  . Stroke Brother   . Diabetes Mother   . Diabetes Son   . Diabetes Brother   . Heart attack Brother   . Cholecystitis Daughter   . Cholecystitis Daughter   . Heart attack Father     Social history:  Social History   Social History  . Marital status: Widowed    Spouse name: N/A  . Number of children: N/A  . Years of education: N/A   Occupational History  . retired    Social History Main Topics  . Smoking status: Current Every Day Smoker    Packs/day: 0.25    Types: Cigarettes  . Smokeless tobacco: Never Used  . Alcohol use No  . Drug use: No  . Sexual activity: No   Other Topics Concern  . Not on file   Social History Narrative   Second husband, Morris, died   Daughter and her children live with her    Current outpatient meds: Current Meds  Medication Sig  . acetaminophen (TYLENOL) 650 MG CR tablet Take 650 mg by mouth every 8 (eight) hours as needed.  Marland Kitchen amLODipine (NORVASC) 10 MG tablet TAKE ONE TABLET ONCE DAILY  . aspirin (BAYER CHILDRENS ASPIRIN) 81 MG chewable tablet Chew 81 mg by mouth daily.    Marland Kitchen atorvastatin (LIPITOR) 40 MG tablet Take 1 tablet (40 mg total) by mouth daily.  . calcium carbonate (TUMS E-X 750) 750 MG chewable  tablet Chew 1 tablet by mouth 2 (two) times daily as needed for heartburn.   . cyanocobalamin 2000 MCG tablet Take 2,000 mcg by mouth daily.    . insulin degludec (TRESIBA FLEXTOUCH) 100 UNIT/ML SOPN FlexTouch Pen Inject 10 Units into the skin daily.  Marland Kitchen lisinopril-hydrochlorothiazide (PRINZIDE,ZESTORETIC) 20-12.5 MG per tablet TAKE ONE TABLET TWICE DAILY    Current inpatient meds:  Current Facility-Administered Medications  Medication Dose Route Frequency Provider Last Rate Last Dose  .  stroke: mapping our early stages of recovery book   Does not apply Once Lavone Neri Opyd, MD      . 0.9 %  sodium  chloride infusion   Intravenous Continuous Lavone Neri Opyd, MD      . aspirin suppository 300 mg  300 mg Rectal Daily Briscoe Deutscher, MD       Or  . aspirin tablet 325 mg  325 mg Oral Daily Lavone Neri Opyd, MD      . atorvastatin (LIPITOR) tablet 80 mg  80 mg Oral Daily Zigmund Linse S Opyd, MD      . heparin injection 5,000 Units  5,000 Units Subcutaneous Q8H Jadon Ressler S Opyd, MD      . insulin aspart (novoLOG) injection 0-15 Units  0-15 Units Subcutaneous TID WC Rogelio Waynick S Opyd, MD      . insulin aspart (novoLOG) injection 0-5 Units  0-5 Units Subcutaneous QHS Bernadean Saling S Opyd, MD      . insulin glargine (LANTUS) injection 10 Units  10 Units Subcutaneous Daily Abisai Coble S Opyd, MD      . metoCLOPramide (REGLAN) tablet 5 mg  5 mg Oral Q6H PRN Lavone Neri Opyd, MD      . multivitamin capsule 1 capsule  1 capsule Oral Daily Apollo Matyas Baisley S Opyd, MD      . pantoprazole (PROTONIX) EC tablet 20 mg  20 mg Oral Daily Alfonzo Arca S Opyd, MD      . senna-docusate (Senokot-S) tablet 1 tablet  1 tablet Oral QHS PRN Briscoe Deutscher, MD      . vitamin B-12 (CYANOCOBALAMIN) tablet 2,000 mcg  2,000 mcg Oral Daily Briscoe Deutscher, MD       Current Outpatient Prescriptions  Medication Sig Dispense Refill  . acetaminophen (TYLENOL) 650 MG CR tablet Take 650 mg by mouth every 8 (eight) hours as needed.    Marland Kitchen amLODipine (NORVASC) 10 MG tablet TAKE ONE TABLET ONCE DAILY 30 tablet 11  . aspirin (BAYER CHILDRENS ASPIRIN) 81 MG chewable tablet Chew 81 mg by mouth daily.      Marland Kitchen atorvastatin (LIPITOR) 40 MG tablet Take 1 tablet (40 mg total) by mouth daily. 90 tablet 3  . calcium carbonate (TUMS E-X 750) 750 MG chewable tablet Chew 1 tablet by mouth 2 (two) times daily as needed for heartburn.     . cyanocobalamin 2000 MCG tablet Take 2,000 mcg by mouth daily.      . insulin degludec (TRESIBA FLEXTOUCH) 100 UNIT/ML SOPN FlexTouch Pen Inject 10 Units into the skin daily.    Marland Kitchen lisinopril-hydrochlorothiazide (PRINZIDE,ZESTORETIC) 20-12.5 MG per  tablet TAKE ONE TABLET TWICE DAILY 31 tablet 3  . metFORMIN (GLUCOPHAGE) 1000 MG tablet TAKE ONE AND ONE-HALF TABLET EVERY MORNING AND ONE TABLET AT SUPPER 75 tablet 2  . metoclopramide (REGLAN) 5 MG tablet Take 5 mg by mouth 4 (four) times daily as needed. Before meals and at bedtime    . metoprolol (LOPRESSOR) 50 MG tablet TAKE ONE TABLET TWICE DAILY 60 tablet 11  .  Multiple Vitamin (MULTIVITAMIN) capsule Take 1 capsule by mouth daily.      . nitroGLYCERIN (NITROSTAT) 0.4 MG SL tablet Place 1 tablet under tongue as directed     . ondansetron (ZOFRAN) 4 MG tablet Take 1 tablet (4 mg total) by mouth every 6 (six) hours. 12 tablet 0  . pantoprazole (PROTONIX) 20 MG tablet Take 1 tablet (20 mg total) by mouth daily. 15 tablet 0    Allergies: Allergies  Allergen Reactions  . Gemfibrozil Other (See Comments)    MYALGIA  . Trulicity [Dulaglutide] Nausea And Vomiting    EXTREME N & V AND LETHARGY    ROS: As per HPI. A full 14-point review of systems was performed and is otherwise unremarkable.   PE:  BP 110/76   Pulse 80   Temp 98.2 F (36.8 C) (Oral)   Resp 26   SpO2 97%   General: Very thin elderly woman resting comfortably in bed, no acute distress. AAO x4. Her speech is best described as thick and sometimes hesitant without overt aphasia. She has mild dysarthria. It is noteworthy that the patient presently feels as if her speech is at her usual baseline. Follows commands briskly. Affect is bright with congruent mood. Comportment is normal.  HEENT: Normocephalic. Neck supple without LAD. MMM, OP clear. Dentition good. Sclerae anicteric. No conjunctival injection.  CV: Regular, no murmur. Carotid pulses full and symmetric with a quiet bruit on the right. Distal pulses 2+ and symmetric.  Lungs: CTAB. Breath sounds are diminished at both bases. Abdomen: Soft, thin, non-distended, non-tender. Bowel sounds present x4.  Extremities: No C/C/E. she complains of pain in the left lower extremity  with movement so this does limit the examination. Neuro:  CN: Pupils are equal and round. They are symmetrically reactive from 3-->2 mm. EOMI without nystagmus. No reported diplopia. Facial sensation is intact to light touch. Face is slightly asymmetric at rest with subtle flattening of the right nasolabial fold. She has otherwise normal strength and mobility. Hearing is intact to conversational voice. Palate elevates symmetrically and uvula is midline. Voice is normal in tone, pitch and quality. Bilateral SCM and trapezii are 5/5. Tongue is midline with normal bulk and mobility.  Motor: Bulk is diminished throughout. Tone and strength are normal though the proximal left lower extremity was not fully tested because of discomfort. No tremor or other abnormal movements. No drift.  Sensation: Intact to light touch.  DTRs: 2+, symmetric. Toes downgoing bilaterally. No pathologic reflexes.  Coordination: Finger-to-nose is without dysmetria bilaterally. Finger taps are normal in amplitude and speed, no decrement.   Labs:  Lab Results  Component Value Date   WBC 12.6 (H) 09/21/2016   HGB 11.3 (L) 09/21/2016   HCT 35.1 (L) 09/21/2016   PLT 395 09/21/2016   GLUCOSE 349 (H) 09/21/2016   CHOL 236 (H) 05/09/2012   TRIG 157 (H) 05/09/2012   HDL 38 (L) 05/09/2012   LDLDIRECT 116 (H) 08/26/2012   LDLCALC 167 (H) 05/09/2012   ALT 10 (L) 07/13/2016   AST 19 07/13/2016   NA 135 09/21/2016   K 4.6 09/21/2016   CL 99 (L) 09/21/2016   CREATININE 1.66 (H) 09/21/2016   BUN 19 09/21/2016   CO2 25 09/21/2016   TSH 3.641 03/31/2010   INR 1.05 09/22/2016   HGBA1C 7.8 04/02/2013   Troponin 0.03 Urinalysis notable for glucose 250 mg/dL, small hemoglobin, protein >300 mg/dL   Imaging:  I have personally and independently reviewed the MRI scan of the  brain without contrast from 09/21/16. This shows no evidence of acute ischemia. On diffusion-weighted imaging, there is some increased signal in the left middle  cerebellar peduncle that corresponds to increased signal on T2 sequences. I suspect this represents T2 shine-through. There is a severe burden of chronic small vessel ischemic change in the bihemispheric white matter and pons. Moderate diffuse generalized atrophy is noted.  Assessment and Plan:  1. Possible TIA: She describes a transient episode of aphasia, most suggestive of word salad based upon her description. This would be concerning for transient ischemia in the left MCA distribution. Known risk factors for cerebrovascular disease in this patient include diabetes, hypertension, history of prior stroke, known carotid stenosis, and age. Additional workup has been ordered, including TTE, fasting lipids, and hemoglobin a1c. I will add carotid Dopplers to reassess her carotid stenosis. She may ultimately require further imaging with CT angiography but we'll see what Dopplers reveal first. Further testing will be determined by results from these initial studies. Continue aspirin for secondary stroke prevention. Continue atorvastatin with goal LDL less than 70. Ensure adequate glucose control. DVT prophylaxis as needed.   2. Aphasia: She had transient aphasia as noted above. No acute issues. This can be followed.  3. Carotid artery stenosis: She has known mild carotid stenosis bilaterally. Repeat carotid Dopplers for comparison to prior studies in order to evaluate for possible progression. May proceed to CT angiography if stenosis has increased on Dopplers. In the meantime, continue the risk factor modification as noted above.  This was discussed with the patient and she is in agreement with the plan as noted. She was given the opportunity to ask questions and these were addressed to her satisfaction.  Thank you for the opportunity to participate in this patient's care. Please feel free to call with any questions or concerns. The stroke team will assume care moving forward.

## 2016-09-22 NOTE — ED Notes (Signed)
Pt brought in by family after having a mechanical fall where she says her legs gave out and she felt dizzy. No c/o dizziness upon presentation here or through evaluation & treatment. Per family member, had a brief episode of aphasia earlier in the week, similar to what happened prior to her previous CVA.   Pt being admitted for TIA. Pt passed stroke swallow screen, with carb modified diet ordered. Pt A&O x4 and able follow commands. Able to use bedside commode, but have not attempted to ambulate outside of room. No existing neural deficits from prior CVA, and no new deficits.   Pt's presented as hyperglycemic as well, with initial CBG 349. Pt given 500mL NS bolus and 6units novolog. CBG @ 0335 was 206. Last CBG was 245. IV maintenance fluids infusing at 4075mL/hr.

## 2016-09-22 NOTE — Progress Notes (Addendum)
STROKE TEAM PROGRESS NOTE   HISTORY OF PRESENT ILLNESS (per record) This is a 80 year old right-handed woman who was brought to the emergency department by her daughter after she had a fall at home. Sounds as if she had an episode on 09/20/16 where she had an episode of difficulty speaking. She describes "babbling" and says that her daughter told her that sounded as if she was speaking [place]. She says that she cannot understand her own language. This lasted perhaps 5 minutes and then completely resolved. It does not sound as if she was slurring her speech. She describes associated dizziness and mild headache. After this episode, the patient returned to her normal state of health. However, just prior to presentation to the emergency department yesterday, she lost her balance and fell. This sounds as if it was a mechanical fall as the patient describes catching her foot and tripping. She's had some pain in the left leg since the fall.  The patient reports that she had a stroke approximately 17 years ago. She had similar difficulty with her language at the time of that stroke as well as weakness in her right arm. After completion of rehabilitation services, she reports little in the way of residual deficit. She says that she has known carotid stenosis bilaterally and this is been followed for several years. Neurology consultation is now requested for recommendations regarding TIA.   SUBJECTIVE (INTERVAL HISTORY) No family is at the bedside.  Overall she feels her condition is gradually improving. She stated that she feels okay, she stated that her fall is a mechanical fall yesterday, not because of the weakness. She did admit that 2 days ago she had lightheadedness, and speech difficulty, lasting 5 minutes and resolved.   OBJECTIVE Temp:  [98.2 F (36.8 C)-98.5 F (36.9 C)] 98.5 F (36.9 C) (10/14 0802) Pulse Rate:  [78-88] 79 (10/14 0802) Cardiac Rhythm: Normal sinus rhythm (10/14 0755) Resp:   [17-26] 18 (10/14 0802) BP: (107-142)/(68-94) 130/83 (10/14 0802) SpO2:  [93 %-100 %] 93 % (10/14 0802)  CBC:   Recent Labs Lab 09/21/16 1809 09/22/16 1013  WBC 12.6* 11.9*  HGB 11.3* 9.6*  HCT 35.1* 29.8*  MCV 82.8 82.3  PLT 395 313    Basic Metabolic Panel:   Recent Labs Lab 09/21/16 1809  NA 135  K 4.6  CL 99*  CO2 25  GLUCOSE 349*  BUN 19  CREATININE 1.66*  CALCIUM 9.0    Lipid Panel:     Component Value Date/Time   CHOL 142 09/22/2016 0500   TRIG 131 09/22/2016 0500   HDL 37 (L) 09/22/2016 0500   CHOLHDL 3.8 09/22/2016 0500   VLDL 26 09/22/2016 0500   LDLCALC 79 09/22/2016 0500   HgbA1c:  Lab Results  Component Value Date   HGBA1C 7.8 04/02/2013   Urine Drug Screen: No results found for: LABOPIA, COCAINSCRNUR, LABBENZ, AMPHETMU, THCU, LABBARB    IMAGING I have personally reviewed the radiological images below and agree with the radiology interpretations.  Dg Chest 2 View 09/22/2016 No active cardiopulmonary disease.   Mr Brain Wo Contrast 09/22/2016 1. Nonspecific left brachium pontis lesion without diffusion restriction may represent a late subacute infarct, focus of demyelination, and mass is not excluded. Consider contrast CT or MRI for further characterization.  2. No acute intracranial abnormality identified.  3. Advanced chronic microvascular ischemic changes and parenchymal volume loss.   TEE pending   PHYSICAL EXAM  Temp:  [98.1 F (36.7 C)-98.5 F (36.9 C)] 98.1 F (  36.7 C) (10/14 1403) Pulse Rate:  [76-88] 76 (10/14 1403) Resp:  [17-26] 19 (10/14 1403) BP: (107-172)/(64-125) 166/125 (10/14 1403) SpO2:  [93 %-100 %] 98 % (10/14 1403)  General - Very thin elderly woman resting comfortably in bed, no acute distress.Marland Kitchen.  Ophthalmologic - Fundi not visualized due to eye movement.  Cardiovascular - Regular rate and rhythm.  Mental Status -  Level of arousal and orientation to month, place, and person were intact, but not  orientated to year and age. Language including expression, naming, repetition, comprehension was assessed and found intact, sometimes hesitancy of speech, mild dysarthria. Fund of Knowledge was assessed and was intact.  Cranial Nerves II - XII - II - Visual field intact OU. III, IV, VI - Extraocular movements intact. V - Facial sensation intact bilaterally. VII - Facial movement intact bilaterally. VIII - Hearing & vestibular intact bilaterally. X - Palate elevates symmetrically, mild dysarthria. XI - Chin turning & shoulder shrug intact bilaterally. XII - Tongue protrusion intact.  Motor Strength - [name] strength was 4/5 in all extremities and pronator drift was absent.  Bulk was decreased throughout and fasciculations were absent.   Motor Tone - Muscle tone was assessed at the neck and appendages and was normal.  Reflexes - [name] reflexes were 1+ in all extremities and she had no pathological reflexes.  Sensory - Light touch, temperature/pinprick were assessed and were symmetrical.    Coordination - The patient had normal movements in the hands with no ataxia or dysmetria.  Tremor was absent.  Gait and Station - deferred due to safety concerns.   ASSESSMENT/PLAN Ms. Blair HeysBessie M Berne is a 80 y.o. female with history of a previous stroke, hypertension, ongoing tobacco use, herpes zoster, GI bleed, diabetes mellitus, and bilateral carotid stenosis presenting with a mechanical fall, dizziness, mild headache, and transient speech difficulties.  She did not receive IV t-PA due to minimal deficits.  Possible subacute stroke at left cerebellar peduncle. However, DWI signal atypical, need to rule out tumor.  Resultant - resolution of deficits.  MRI - DWI signal change at left brachium pontis lesion.   Consider MRI with contrast, MRA head, MRA neck with contrast once Cre normalized  2D Echo - pending  LDL - 79  HgbA1c pending  VTE prophylaxis - subcutaneous  heparin Diet Carb Modified Fluid consistency: Thin; Room service appropriate? Yes  aspirin 81 mg daily prior to admission, now on aspirin 325 mg daily.   Patient counseled to be compliant with her antithrombotic medications  Ongoing aggressive stroke risk factor management  Therapy recommendations: pending  Disposition: Pending  Hypertension  Blood pressure somewhat low at times  Permissive hypertension (OK if < 220/120) but gradually normalize in 5-7 days  Long-term BP goal normotensive  Hyperlipidemia  Home meds: Lipitor 40 mg daily prior to admission.  LDL 79, goal < 70  Lipitor resumed  Continue statin at discharge  Diabetes  HgbA1c pending, goal < 7.0  Uncontrolled  On Lantus  SSI  CBG showed hyperglycemia  Tobacco abuse  Current smoker  Smoking cessation counseling provided  Pt is willing to quit  Other Stroke Risk Factors  Advanced age  Hx stroke/TIA - 17 years ago with right arm weakness, no residual deficit  Family hx stroke (brother)  Other Active Problems  Anemia 9.6 / 29.8  Mild leukocytosis - 11.9  Mildly elevated creatinine - 1.66 -> 1.55 - BNP monitoring  Elevated TSH - repeat TSH and check free T4 in a.m.  Hospital day # 0  Marvel Plan, MD PhD Stroke Neurology 09/22/2016 5:30 PM   To contact Stroke Continuity provider, please refer to [email or web address]. After hours, contact General Neurology

## 2016-09-22 NOTE — ED Notes (Signed)
Admitting MD at bedside.

## 2016-09-22 NOTE — Progress Notes (Signed)
Received from ED via stretcher; took two staff members to walk patient to the bed; patient is alert and oriented; c/o lower extremity weakness greater then 24 hours now per patient; reports some dizziness; oriented to room and unit routine; bed alarm set and safety maintained; education provided to patient; no family present at this time.

## 2016-09-22 NOTE — ED Notes (Signed)
Patient transported to MRI 

## 2016-09-23 ENCOUNTER — Inpatient Hospital Stay (HOSPITAL_COMMUNITY): Payer: PPO

## 2016-09-23 DIAGNOSIS — I6523 Occlusion and stenosis of bilateral carotid arteries: Secondary | ICD-10-CM

## 2016-09-23 DIAGNOSIS — R2681 Unsteadiness on feet: Secondary | ICD-10-CM

## 2016-09-23 DIAGNOSIS — G459 Transient cerebral ischemic attack, unspecified: Secondary | ICD-10-CM

## 2016-09-23 LAB — HEMOGLOBIN A1C
Hgb A1c MFr Bld: 8.6 % — ABNORMAL HIGH (ref 4.8–5.6)
Mean Plasma Glucose: 200 mg/dL

## 2016-09-23 LAB — T4, FREE: Free T4: 1.62 ng/dL — ABNORMAL HIGH (ref 0.61–1.12)

## 2016-09-23 LAB — BASIC METABOLIC PANEL
Anion gap: 11 (ref 5–15)
BUN: 17 mg/dL (ref 6–20)
CHLORIDE: 108 mmol/L (ref 101–111)
CO2: 20 mmol/L — AB (ref 22–32)
Calcium: 8.5 mg/dL — ABNORMAL LOW (ref 8.9–10.3)
Creatinine, Ser: 1.46 mg/dL — ABNORMAL HIGH (ref 0.44–1.00)
GFR calc Af Amer: 39 mL/min — ABNORMAL LOW (ref >60)
GFR calc non Af Amer: 33 mL/min — ABNORMAL LOW (ref >60)
GLUCOSE: 140 mg/dL — AB (ref 65–99)
POTASSIUM: 4.5 mmol/L (ref 3.5–5.1)
Sodium: 139 mmol/L (ref 135–145)

## 2016-09-23 LAB — CBC
HEMATOCRIT: 32.5 % — AB (ref 36.0–46.0)
HEMOGLOBIN: 10.3 g/dL — AB (ref 12.0–15.0)
MCH: 26.9 pg (ref 26.0–34.0)
MCHC: 31.7 g/dL (ref 30.0–36.0)
MCV: 84.9 fL (ref 78.0–100.0)
Platelets: 305 10*3/uL (ref 150–400)
RBC: 3.83 MIL/uL — AB (ref 3.87–5.11)
RDW: 15.1 % (ref 11.5–15.5)
WBC: 13.8 10*3/uL — AB (ref 4.0–10.5)

## 2016-09-23 LAB — GLUCOSE, CAPILLARY
GLUCOSE-CAPILLARY: 159 mg/dL — AB (ref 65–99)
GLUCOSE-CAPILLARY: 185 mg/dL — AB (ref 65–99)
GLUCOSE-CAPILLARY: 142 mg/dL — AB (ref 65–99)
GLUCOSE-CAPILLARY: 238 mg/dL — AB (ref 65–99)

## 2016-09-23 LAB — TSH: TSH: 2.98 u[IU]/mL (ref 0.350–4.500)

## 2016-09-23 LAB — ECHOCARDIOGRAM COMPLETE
Height: 63 in
WEIGHTICAEL: 1184 [oz_av]

## 2016-09-23 MED ORDER — INSULIN GLARGINE 100 UNIT/ML ~~LOC~~ SOLN
14.0000 [IU] | Freq: Every day | SUBCUTANEOUS | Status: DC
  Administered 2016-09-24: 14 [IU] via SUBCUTANEOUS
  Filled 2016-09-23: qty 0.14

## 2016-09-23 MED ORDER — SODIUM CHLORIDE 0.9 % IV SOLN
INTRAVENOUS | Status: AC
  Administered 2016-09-24: 02:00:00 via INTRAVENOUS
  Filled 2016-09-23: qty 1000

## 2016-09-23 MED ORDER — ACETAMINOPHEN 325 MG PO TABS
650.0000 mg | ORAL_TABLET | Freq: Four times a day (QID) | ORAL | Status: DC | PRN
  Administered 2016-09-23: 650 mg via ORAL
  Filled 2016-09-23: qty 2

## 2016-09-23 NOTE — Evaluation (Signed)
Physical Therapy Evaluation Patient Details Name: Gwendolyn Wallace MRN: [id number] DOB: 1936-05-08 Today's Date: 09/23/2016   History of Present Illness  80 yo female s/p fall with babbled speech for 5 minutes. Pt now with resolved speech deficits. MRI- nonspecific L brachium pontis lesion wihtut diffusion restriction may represent a late subacute infarct focus of demyelination No acute abnormality identified PMH:   Clinical Impression  Pt admitted with above diagnosis. Pt currently with functional limitations due to the deficits listed below (see PT Problem List). On eval, pt required min assist bed mobility, min assist transfers and min guard assist ambulation with RW 200 feet. Pt will benefit from skilled PT to increase their independence and safety with mobility to allow discharge to the venue listed below.       Follow Up Recommendations Supervision for mobility/OOB;Home health PT    Equipment Recommendations  None recommended by PT    Recommendations for Other Services       Precautions / Restrictions Precautions Precautions: Fall      Mobility  Bed Mobility Overal bed mobility: Needs Assistance Bed Mobility: Supine to Sit     Supine to sit: Min assist     General bed mobility comments: HOB flat, no rails, assist needed to elevate trunk  Transfers Overall transfer level: Needs assistance Equipment used: Ambulation equipment used Transfers: Sit to/from BJ'sStand;Stand Pivot Transfers Sit to Stand: Min assist Stand pivot transfers: Min assist       General transfer comment: assist to power up and stabilize initial standing balance  Ambulation/Gait Ambulation/Gait assistance: Min guard Ambulation Distance (Feet): 200 Feet Assistive device: Rolling walker (2 wheeled) Gait Pattern/deviations: Step-through pattern Gait velocity: decreased Gait velocity interpretation: Below normal speed for age/gender General Gait Details: Encouragement needed for pt to use RW. She  prefers to furniture walk or hold onto family members for support.  Stairs            Wheelchair Mobility    Modified Rankin (Stroke Patients Only) Modified Rankin (Stroke Patients Only) Pre-Morbid Rankin Score: Slight disability Modified Rankin: Moderate disability     Balance Overall balance assessment: Needs assistance Sitting-balance support: No upper extremity supported;Feet supported Sitting balance-Leahy Scale: Fair     Standing balance support: Single extremity supported;During functional activity Standing balance-Leahy Scale: Poor                               Pertinent Vitals/Pain Pain Assessment: No/denies pain    Home Living Family/patient expects to be discharged to:: Private residence Living Arrangements: Children Available Help at Discharge: Family;Available PRN/intermittently Type of Home: House Home Access: Stairs to enter   Entrance Stairs-Number of Steps: 1 Home Layout: One level Home Equipment: Walker - 2 wheels;Cane - single point      Prior Function Level of Independence: Independent               Hand Dominance   Dominant Hand: Right    Extremity/Trunk Assessment   Upper Extremity Assessment: Defer to OT evaluation           Lower Extremity Assessment: Generalized weakness      Cervical / Trunk Assessment: Kyphotic  Communication   Communication: HOH  Cognition Arousal/Alertness: Awake/alert Behavior During Therapy: WFL for tasks assessed/performed Overall Cognitive Status: Within Functional Limits for tasks assessed                      General Comments  Exercises     Assessment/Plan    PT Assessment Patient needs continued PT services  PT Problem List Decreased strength;Decreased activity tolerance;Decreased balance;Decreased knowledge of use of DME;Decreased mobility;Decreased safety awareness          PT Treatment Interventions DME instruction;Gait training;Stair  training;Functional mobility training;Balance training;Therapeutic exercise;Therapeutic activities;Patient/family education    PT Goals (Current goals can be found in the Care Plan section)  Acute Rehab PT Goals Patient Stated Goal: to go home drink sweet tea PT Goal Formulation: With patient Time For Goal Achievement: 10/07/16 Potential to Achieve Goals: Good    Frequency Min 3X/week   Barriers to discharge        Co-evaluation PT/OT/SLP Co-Evaluation/Treatment: Yes Reason for Co-Treatment: Necessary to address cognition/behavior during functional activity PT goals addressed during session: Mobility/safety with mobility;Balance         End of Session Equipment Utilized During Treatment: Gait belt Activity Tolerance: Patient tolerated treatment well Patient left: in chair;with call bell/phone within reach;with family/visitor present Nurse Communication: Mobility status         Time: 1610-9604 PT Time Calculation (min) (ACUTE ONLY): 31 min   Charges:   PT Evaluation $PT Eval Moderate Complexity: 1 Procedure     PT G CodesIlda Foil 09/23/2016, 2:31 PM

## 2016-09-23 NOTE — Progress Notes (Signed)
Occupational Therapy Evaluation Patient Details Name: Gwendolyn HeysBessie M Wallace MRN: [id number] DOB: 08/16/1936 Today's Date: 09/23/2016    History of Present Illness 80 yo female s/p fall with babbled speech for 5 minutes. Pt now with resolved speech deficits. MRI- nonspecific L brachium pontis lesion wihtut diffusion restriction may represent a late subacute infarct focus of demyelination No acute abnormality identified PMH:    Past Medical History:  Diagnosis Date  . Carotid stenosis, bilateral 08/07/05   bilat 40-60%  . CVA (cerebral infarction) 07/10/02  . Diabetes mellitus without complication (HCC)   . Gastric bleeding 10/01  . Herpes zoster 4/01  . Hypertension       Clinical Impression    PT admitted with workup underway for CVA . Pt currently with functional limitiations due to the deficits listed below (see OT problem list). PTA stays at home during the day for at least 8 hours alone. Pt has family (A) for transfers in the community.  Pt will benefit from skilled OT to increase their independence and safety with adls and balance to allow discharge HHOT. Recommend use of RW with all transfers.    Follow Up Recommendations  Home health OT;Supervision - Intermittent    Equipment Recommendations  None recommended by OT    Recommendations for Other Services       Precautions / Restrictions Precautions Precautions: Fall      Mobility Bed Mobility Overal bed mobility: Needs Assistance Bed Mobility: Supine to Sit     Supine to sit: Min assist     General bed mobility comments: pt required incr time and effort to progress to eob. pt needed (A) to elevate trunk from surface  Transfers Overall transfer level: Needs assistance Equipment used: Rolling walker (2 wheeled) Transfers: Sit to/from Stand Sit to Stand: Min assist         General transfer comment: (A) to power up from bed surface.     Balance Overall balance assessment: Needs assistance Sitting-balance  support: No upper extremity supported;Feet supported Sitting balance-Leahy Scale: Fair     Standing balance support: Single extremity supported;During functional activity Standing balance-Leahy Scale: Poor                              ADL Overall ADL's : Needs assistance/impaired Eating/Feeding: Set up   Grooming: Wash/dry hands;Minimal assistance;Standing Grooming Details (indicate cue type and reason): pt needed cues to locate items but able to complete task. pt guarded at times during task and placing Ue on sink surface                 Toilet Transfer: Minimal assistance;RW;Regular Toilet;Grab bars Toilet Transfer Details (indicate cue type and reason): pt told to call therapist to transfer off toilet. Pt forgot and attempting to exit bathroom. pt states "i didnt know i was suppose to" demonstrates poor short term recall Toileting- Clothing Manipulation and Hygiene: Min guard;Sitting/lateral lean       Functional mobility during ADLs: Minimal assistance;Rolling walker General ADL Comments: Pt reports dislike for RW at home because "it makes me slower" pt provided gait velocity assessment and discussed with patient actually being 5 seconds faster with decr fall risk. pt state "oh really"      Vision     Perception     Praxis      Pertinent Vitals/Pain Pain Assessment: No/denies pain     Hand Dominance Right   Extremity/Trunk Assessment Upper Extremity Assessment Upper  Extremity Assessment: Generalized weakness   Lower Extremity Assessment Lower Extremity Assessment: Defer to PT evaluation   Cervical / Trunk Assessment Cervical / Trunk Assessment: Kyphotic   Communication Communication Communication: HOH   Cognition Arousal/Alertness: Awake/alert Behavior During Therapy: WFL for tasks assessed/performed Overall Cognitive Status: Within Functional Limits for tasks assessed                     General Comments       Exercises        Shoulder Instructions      Home Living Family/patient expects to be discharged to:: Private residence Living Arrangements: Children (grandchildren ( school age)) Available Help at Discharge: Family;Available PRN/intermittently Type of Home: House Home Access: Stairs to enter [employer] of Steps: 1   Home Layout: One level     Bathroom Shower/Tub: Other (comment) (pan bath due to fear of fall in shower)   Bathroom Toilet: Standard     Home Equipment: Walker - 2 wheels;Cane - single point          Prior Functioning/Environment Level of Independence: Independent                 OT Problem List: Decreased strength;Decreased activity tolerance;Impaired balance (sitting and/or standing);Decreased safety awareness;Decreased knowledge of precautions;Decreased knowledge of use of DME or AE   OT Treatment/Interventions: Self-care/ADL training;Therapeutic exercise;DME and/or AE instruction;Therapeutic activities;Patient/family education;Balance training    OT Goals(Current goals can be found in the care plan section) Acute Rehab OT Goals Patient Stated Goal: to go home drink sweet tea OT Goal Formulation: With patient/family Time For Goal Achievement: 10/07/16 Potential to Achieve Goals: Good  OT Frequency: Min 2X/week   Barriers to D/C:            Co-evaluation              End of Session Equipment Utilized During Treatment: Gait belt;Rolling walker Nurse Communication: Mobility status;Precautions  Activity Tolerance: Patient tolerated treatment well Patient left: in chair;with call bell/phone within reach;with family/visitor present   Time:  -1213   Charges:  OT General Charges $OT Visit: 1 Procedure OT Evaluation $OT Eval Moderate Complexity: 1 Procedure G-Codes:    Harolyn Rutherford Oct 07, 2016, 1:12 PM  Mateo Flow   OTR/L Pager: [phone] Office: [phone] .

## 2016-09-23 NOTE — Progress Notes (Signed)
Echocardiogram 2D Echocardiogram has been performed.  Gwendolyn Wallace 09/23/2016, 2:43 PM

## 2016-09-23 NOTE — Progress Notes (Addendum)
STROKE TEAM PROGRESS NOTE   SUBJECTIVE (INTERVAL HISTORY) Daughter is at the bedside.  Overall she feels her condition is stable. Daughters stated that patient no frequent falls at home. However, she is not drinking enough water or eating enough food at home. She is a picky eater. She has no dramatic weight loss and she has been thin as this forever.   OBJECTIVE Temp:  [98 F (36.7 C)-99.1 F (37.3 C)] 98.2 F (36.8 C) (10/15 0926) Pulse Rate:  [75-99] 99 (10/15 0926) Cardiac Rhythm: Normal sinus rhythm (10/14 1900) Resp:  [18-20] 18 (10/15 0926) BP: (104-195)/(64-125) 104/66 (10/15 0926) SpO2:  [95 %-98 %] 97 % (10/15 0926) Weight:  [33.6 kg (74 lb)] 33.6 kg (74 lb) (10/15 0800)  CBC:   Recent Labs Lab 09/22/16 1013 09/23/16 0415  WBC 11.9* 13.8*  HGB 9.6* 10.3*  HCT 29.8* 32.5*  MCV 82.3 84.9  PLT 313 305    Basic Metabolic Panel:   Recent Labs Lab 09/22/16 1013 09/23/16 0415  NA 136 139  K 3.6 4.5  CL 104 108  CO2 23 20*  GLUCOSE 207* 140*  BUN 17 17  CREATININE 1.55* 1.46*  CALCIUM 8.6* 8.5*    Lipid Panel:     Component Value Date/Time   CHOL 142 09/22/2016 0500   TRIG 131 09/22/2016 0500   HDL 37 (L) 09/22/2016 0500   CHOLHDL 3.8 09/22/2016 0500   VLDL 26 09/22/2016 0500   LDLCALC 79 09/22/2016 0500   HgbA1c:  Lab Results  Component Value Date   HGBA1C 7.8 04/02/2013   Urine Drug Screen: No results found for: LABOPIA, COCAINSCRNUR, LABBENZ, AMPHETMU, THCU, LABBARB    IMAGING I have personally reviewed the radiological images below and agree with the radiology interpretations.  Dg Chest 2 View 09/22/2016 No active cardiopulmonary disease.   Mr Brain Wo Contrast 09/22/2016 1. Nonspecific left brachium pontis lesion without diffusion restriction may represent a late subacute infarct, focus of demyelination, and mass is not excluded. Consider contrast CT or MRI for further characterization.  2. No acute intracranial abnormality identified.   3. Advanced chronic microvascular ischemic changes and parenchymal volume loss.   TEE pending  CUS pending  MRA pending   PHYSICAL EXAM  Temp:  [98 F (36.7 C)-99.1 F (37.3 C)] 98.2 F (36.8 C) (10/15 0926) Pulse Rate:  [75-99] 99 (10/15 0926) Resp:  [18-20] 18 (10/15 0926) BP: (104-195)/(64-125) 104/66 (10/15 0926) SpO2:  [95 %-98 %] 97 % (10/15 0926) Weight:  [33.6 kg (74 lb)] 33.6 kg (74 lb) (10/15 0800)  General - Very thin elderly woman resting comfortably in bed, no acute distress.Marland Kitchen.  Ophthalmologic - Fundi not visualized due to eye movement.  Cardiovascular - Regular rate and rhythm.  Mental Status -  Level of arousal and orientation to month, place, and person were intact, but not orientated to year and age. Language including expression, naming, repetition, comprehension was assessed and found intact. Fund of Knowledge was assessed and was intact.  Cranial Nerves II - XII - II - Visual field intact OU. III, IV, VI - Extraocular movements intact. V - Facial sensation intact bilaterally. VII - Facial movement intact bilaterally. VIII - Hearing & vestibular intact bilaterally. X - Palate elevates symmetrically. XI - Chin turning & shoulder shrug intact bilaterally. XII - Tongue protrusion intact.  Motor Strength - [name] strength was 4/5 in all extremities and pronator drift was absent.  Bulk was decreased throughout and fasciculations were absent.   Motor Tone -  Muscle tone was assessed at the neck and appendages and was normal.  Reflexes - [name] reflexes were 1+ in all extremities and she had no pathological reflexes.  Sensory - Light touch, temperature/pinprick were assessed and were symmetrical.    Coordination - The patient had normal movements in the hands with no ataxia or dysmetria.  Tremor was absent.  Gait and Station - deferred due to safety concerns.   ASSESSMENT/PLAN [name] is a 80 y.o. female with history of a  previous stroke, hypertension, ongoing tobacco use, herpes zoster, GI bleed, diabetes mellitus, and bilateral carotid stenosis presenting with a mechanical fall, dizziness, mild headache, and transient speech difficulties.  She did not receive IV t-PA due to minimal deficits.  Possible subacute stroke at left cerebellar peduncle. However, DWI signal atypical, need to rule out tumor. Due to decreased GFR, recommend MRI brain  with contrast as outpatient once GFR improves.  Resultant - resolution of deficits.  MRI - DWI signal change at left brachium pontis lesion, possible subacute infarct, but tumor cannot be ruled out.   Consider MRI with contrast as outpt once GFR improves  2D Echo - pending  Carotid Doppler - pending  MRA head - pending  LDL - 79  HgbA1c pending  VTE prophylaxis - subcutaneous heparin Diet Carb Modified Fluid consistency: Thin; Room service appropriate? Yes  aspirin 81 mg daily prior to admission, now on aspirin 325 mg daily.   Patient counseled to be compliant with her antithrombotic medications  Ongoing aggressive stroke risk factor management  Therapy recommendations: pending  Disposition: Pending  Elevated creatinine / decreased GFR   Cre 1.74->1.66->1.55->1.46  Still low GFR, radiology not recommending MRI or CT with contrast  Consider MRI with contrast as outpt once GFR improves  Will do carotid Doppler and MRA head this time for stroke workup  Recommend adequate hydration at home  Bilateral carotid stenosis  03/2015 CUS bilateral 50-69% stenosis  CUS pending  Hypertension  Blood pressure somewhat low at times  Permissive hypertension (OK if < 220/120) but gradually normalize in 5-7 days  Long-term BP goal normotensive  Hyperlipidemia  Home meds: Lipitor 40 mg daily prior to admission.  LDL 79, goal < 70  Lipitor resumed  Continue statin at discharge  Diabetes  HgbA1c pending, goal < 7.0  Uncontrolled  On  Lantus  SSI  CBG showed hyperglycemia  Tobacco abuse  Current smoker  Smoking cessation counseling provided  Pt is willing to quit  Other Stroke Risk Factors  Advanced age  Hx stroke/TIA - 17 years ago with right arm weakness, no residual deficit  Family hx stroke (brother)  Other Active Problems  Anemia 9.6 / 29.8  Mild leukocytosis - 11.9 -> 13.8 (afebrile)    Hospital day # 1  Marvel Plan, MD PhD Stroke Neurology 09/23/2016 1:51 PM   To contact Stroke Continuity provider, please refer to [email or web address]. After hours, contact General Neurology

## 2016-09-23 NOTE — Progress Notes (Signed)
PROGRESS NOTE  Gwendolyn Wallace Gwendolyn Wallace DOB: Jul 17, 1936 DOA: 09/21/2016 PCP: Dwana MelenaZack Hall, MD  Brief History:  80 y.o.femalewith medical history significant forhypertension, insulin-dependent diabetes mellitus, coronary artery disease status post CABG, and bilateral carotid artery stenosis who presents to the emergency department after a fall and reports transient dysarthria, dysphasia and dizziness on the evening of 09/21/16. [name] daughter noted that the patient's speech was "babbling", and the patient had word finding difficulties at home. The episode lasted approximately 5 minutes. In addition, the patient also reports also for balance resulting in a fall which resulted in left hip pain. There was no loss of consciousness. Since her mechanical fall, the patient has reported some weakness in her left leg.  CT of the brain was unremarkable. MRI of the brain showed nonspecific left brachium pontis lesion. The patient was admitted for further neurologic workup. Neurology was consulted to assist with management.  Assessment/Plan: Dysathria/Dysphasia -concerned about subacute stroke cerebellum vs brain mass -09/22/16--case discussed with Dr. Shela CommonsJ Xu-->MRI brain WITH gad if renal function improves with IVF -Appreciate Neurology Consult -PT/OT evaluation -CT brain--neg -MRI brain--nonspecific lesion left brachium pontis -MRA brain--asymmetric signal in the right internal carotid artery suggesting decreased profusion in a probable more proximal lesion at the bifurcation. There is moderate narrowing of the supraclinoid left ICA -Carotid Duplex--pending -Echo--pending -LDL--79-->Continue lipitor 40 mg daily -HbA1C--8.6 -Antiplatelet--ASA 325 mg  Carotid artery stenosis  - Ultrasound from April 2016 features bilateral stenosis of 50-70%  - Per report of pt's daughter, she was to follow-up with vascular surgery after the ultrasound but never did  - CUS  pending  CAD - Status-post CABG in 2000  - No anginal complaints on admission  - Continue ASA and Lipitor;  -metoprolol and lisinopril held at admission    Insulin-dependent DM - HbA1c--8.6 - Managed at home with metformin 1500 mg qD and Tresiba 10 units qD; these will be held while in hospital  - Start with Lantus 10 units qD and moderate-intensity sliding-scale correctional   Hypertension  - Managed with Norvasc, lisinopril, HCTZ, and metoprolol at home;  -held on admission given concern for stroke - Resume antihypertensives as appropriate   Kidney disease of uncertain chronicity  - Baseline SCr  1.5-1.7? - SCr was 0.76 in 2013, the most recent prior value available  - hold lisinopril -09/23/16--continue IVF--give another liter  Normocytic anemia  - Hgb 11.3 on admission - Baseline Hgb 12-13 - Attributed to B12-deficiency, continue supplementation  Leukocytosis - WBC 12,600 on admission without fever or apparent focus of infection  - Appears to be chronic going back years  - Culture if febrile- remains hemodynamically stable - ?low grade CLL -overall stable   Disposition Plan:   Home in 1-2 days  Family Communication:   Daughters updated at bedside 10/15--Total time spent 35 minutes.  Greater than 50% spent face to face counseling and coordinating care.  [id number]  Consultants: Neurology   Code Status:  FULL   DVT Prophylaxis:  [place] Heparin    Procedures: As Listed in Progress Note Above  Antibiotics: None   Subjective:   Objective: Vitals:   09/23/16 0525 09/23/16 0800 09/23/16 0926 09/23/16 1333  BP: (!) 169/72  104/66 (!) 86/51  Pulse: 85  99 89  Resp: 18  18 18   Temp: 98.5 F (36.9 C)  98.2 F (36.8 C) 98.7 F (37.1 C)  TempSrc: Oral  Oral Oral  SpO2: 98%  97% 98%  Weight:  33.6 kg (74 lb)    Height:  5\' 3"  (1.6 m)      Intake/Output Summary (Last 24 hours) at 09/23/16 1642 Last data filed at 09/23/16 0900  Gross per 24  hour  Intake              240 ml  Output                0 ml  Net              240 ml   Weight change:  Exam:   General:  Pt is alert, follows commands appropriately, not in acute distress  HEENT: No icterus, No thrush, No neck mass, [place]/AT  Cardiovascular: RRR, S1/S2, no rubs, no gallops  Respiratory: CTA bilaterally, no wheezing, no crackles, no rhonchi  Abdomen: Soft/+BS, non tender, non distended, no guarding  Extremities: No edema, No lymphangitis, No petechiae, No rashes, no synovitis   Data Reviewed: I have personally reviewed following labs and imaging studies Basic Metabolic Panel:  Recent Labs Lab 09/21/16 1809 09/22/16 1013 09/23/16 0415  NA 135 136 139  K 4.6 3.6 4.5  CL 99* 104 108  CO2 25 23 20*  GLUCOSE 349* 207* 140*  BUN 19 17 17   CREATININE 1.66* 1.55* 1.46*  CALCIUM 9.0 8.6* 8.5*   Liver Function Tests: No results for input(s): AST, ALT, ALKPHOS, BILITOT, PROT, ALBUMIN in the last 168 hours. No results for input(s): LIPASE, AMYLASE in the last 168 hours. No results for input(s): AMMONIA in the last 168 hours. Coagulation Profile:  Recent Labs Lab 09/22/16 0310  INR 1.05   CBC:  Recent Labs Lab 09/21/16 1809 09/22/16 1013 09/23/16 0415  WBC 12.6* 11.9* 13.8*  HGB 11.3* 9.6* 10.3*  HCT 35.1* 29.8* 32.5*  MCV 82.8 82.3 84.9  PLT 395 313 305   Cardiac Enzymes: No results for input(s): CKTOTAL, CKMB, CKMBINDEX, TROPONINI in the last 168 hours. BNP: Invalid input(s): POCBNP CBG:  Recent Labs Lab 09/22/16 1606 09/22/16 2046 09/23/16 0512 09/23/16 1117 09/23/16 1609  GLUCAP 189* 88 159* 185* 142*   HbA1C:  Recent Labs  09/22/16 0500  HGBA1C 8.6*   Urine analysis:    Component Value Date/Time   COLORURINE YELLOW 09/21/2016 1815   APPEARANCEUR CLEAR 09/21/2016 1815   LABSPEC 1.009 09/21/2016 1815   PHURINE 6.5 09/21/2016 1815   GLUCOSEU 250 (A) 09/21/2016 1815   HGBUR SMALL (A) 09/21/2016 1815   BILIRUBINUR  NEGATIVE 09/21/2016 1815   KETONESUR NEGATIVE 09/21/2016 1815   PROTEINUR >300 (A) 09/21/2016 1815   NITRITE NEGATIVE 09/21/2016 1815   LEUKOCYTESUR NEGATIVE 09/21/2016 1815   Sepsis Labs: @LABRCNTIP (procalcitonin:4,lacticidven:4) )No results found for this or any previous visit (from the past 240 hour(s)).   Scheduled Meds: . aspirin  300 mg Rectal Daily   Or  . aspirin  325 mg Oral Daily  . atorvastatin  40 mg Oral Daily  . heparin  5,000 Units Subcutaneous Q8H  . insulin aspart  0-15 Units Subcutaneous TID WC  . insulin aspart  0-5 Units Subcutaneous QHS  . insulin glargine  10 Units Subcutaneous Daily  . multivitamin with minerals  1 tablet Oral Daily  . pantoprazole  20 mg Oral Daily  . cyanocobalamin  2,000 mcg Oral Daily   Continuous Infusions:   Procedures/Studies: Dg Chest 2 View  Result Date: 09/22/2016 CLINICAL DATA:  80 y/o F; status post fall 3 hours ago with dizziness falling onto right hip. EXAM: CHEST  2 VIEW COMPARISON:  07/13/2016 chest radiograph FINDINGS: Stable cardiac silhouette within normal limits. Status post CABG with sternotomy wires aligned and intact. No focal consolidation. No pneumothorax or pleural effusion. No acute osseous abnormality is identified. IMPRESSION: No active cardiopulmonary disease. Electronically Signed   By: Mitzi Hansen M.D.   On: 09/22/2016 04:50   Mr Maxine Glenn Head Wo Contrast  Result Date: 09/23/2016 CLINICAL DATA:  Left middle cerebellar peduncle lesion. Episode of dizziness and headaches. EXAM: MRA HEAD WITHOUT CONTRAST TECHNIQUE: Angiographic images of the Circle of Willis were obtained using MRA technique without intravenous contrast. COMPARISON:  MRI brain 09/22/2016. FINDINGS: There is asymmetric signal in the right internal carotid artery suggesting decreased profusion in a probable more proximal lesion at the bifurcation. There is moderate narrowing of the supraclinoid left ICA. A left posterior communicating artery  aneurysm measures 3 mm. The right A1 segment is not visualized. The left A1 and M1 segments demonstrate mild proximal narrowing without a significant stenosis. The anterior communicating artery is patent. MCA bifurcations are intact bilaterally. There is moderate distal attenuation of MCA branch vessels bilaterally without a focal proximal stenosis. 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 The right vertebral artery is the dominant vessel. Right PICA origin is visualized and normal. The proximal left vertebral artery is poorly seen. Irregularity is noted in the proximal basilar artery without a significant stenosis relative to the more distal vessel. Both posterior cerebral arteries originate from the basilar tip. There is moderate attenuation of distal branches. IMPRESSION: 1. Asymmetric signal in the right internal carotid artery suggesting right-sided bifurcation or more proximal stenosis and decreased perfusion. 2. Moderate stenosis of the supraclinoid left internal carotid artery. 3. 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 4. Diffuse distal small vessel disease without a significant proximal stenosis or occlusion in either the anterior posterior circulation otherwise. 5. Atherosclerotic changes within the proximal basilar artery without a significant stenosis relative to the more distal vessel. Electronically Signed   By: Marin Roberts M.D.   On: 09/23/2016 15:59   Mr Brain Wo Contrast  Result Date: 09/22/2016 CLINICAL DATA:  80 y/o F; episode of dizziness and headaches accompanied by dysarthria lasting 5 minutes. EXAM: MRI HEAD WITHOUT CONTRAST TECHNIQUE: Multiplanar, multiecho pulse sequences of the brain and surrounding structures were obtained without intravenous contrast. COMPARISON:  None. FINDINGS: Brain: No acute infarction, hemorrhage, hydrocephalus, extra-axial collection or mass lesion.Moderate chronic microvascular ischemic changes and  parenchymal volume loss of the brain. 13 mm diffusion hyperintense, T2 hyperintense, T1 hypointense focus within the left brachium pontis without low ADC (series 5, image 14). Advanced chronic microvascular ischemic changes and parenchymal volume loss of the brain. Vascular: Normal flow voids. Skull and upper cervical spine: Normal marrow signal. Sinuses/Orbits: Complete opacification of left maxillary sinus, left anterior ethmoid air cells, and left frontal sinus, the left middle meatus obstructive pattern. Moderate mucosal thickening of the right maxillary sinus. Partial opacification of left mastoid tip. Orbits are grossly unremarkable. Other: None. IMPRESSION: Motion degradation of several sequences. 1. Nonspecific left brachium pontis lesion without diffusion restriction may represent a late subacute infarct, focus of demyelination, and mass is not excluded. Consider contrast CT or MRI for further characterization. 2. No acute intracranial abnormality identified. 3. Advanced chronic microvascular ischemic changes and parenchymal volume loss. Electronically Signed   By: Mitzi Hansen M.D.   On: 09/22/2016 02:46   Dg Hip Unilat W Or Wo Pelvis 2-3 Views Left  Result Date: 09/21/2016 CLINICAL  DATA:  Status post fall on left side, with left hip pain. Initial encounter. EXAM: DG HIP (WITH OR WITHOUT PELVIS) 2-3V LEFT COMPARISON:  None. FINDINGS: There is no evidence of fracture or dislocation. The left femoral head remains seated at the acetabulum. The visualized portions of the left sacroiliac joint are grossly unremarkable. Diffuse vascular calcifications are seen. The visualized bowel gas pattern is unremarkable. IMPRESSION: No evidence of fracture or dislocation. Diffuse vascular calcifications seen. Electronically Signed   By: Roanna Raider M.D.   On: 09/21/2016 18:55   Dg Hip Unilat W Or Wo Pelvis 2-3 Views Right  Result Date: 09/21/2016 CLINICAL DATA:  Initial evaluation for acute trauma,  fall. EXAM: DG HIP (WITH OR WITHOUT PELVIS) 2-3V RIGHT COMPARISON:  None available. FINDINGS: No acute fracture or dislocation. Femoral heads in normal alignment with the acetabula. Femoral head heights preserved. Bony pelvis intact. SI joints approximated. No pubic diastasis. Moderate degenerative osteoarthritic changes about the hips bilaterally. Prominent degenerative changes noted within the lower lumbar spine. Diffuse osteopenia. Prominent vascular calcifications noted. Surgical clips overlie the right inguinal region. IMPRESSION: 1. No acute fracture or dislocation about the right hip. 2. Osteopenia. 3. Atherosclerosis. Electronically Signed   By: Rise Mu M.D.   On: 09/21/2016 18:52    Sobia Karger, DO  [facility] Pager [phone]  If 7PM-7AM, please contact night-coverage [email or web address] Password [email or web address] 09/23/2016, 4:42 PM   LOS: 1 day

## 2016-09-23 NOTE — Progress Notes (Signed)
Patient's BP in R arm is 190/50. L arm is 78/51. MD notified. Take BP in right arm only per MD.

## 2016-09-24 ENCOUNTER — Inpatient Hospital Stay (HOSPITAL_COMMUNITY): Payer: PPO

## 2016-09-24 DIAGNOSIS — E1165 Type 2 diabetes mellitus with hyperglycemia: Secondary | ICD-10-CM

## 2016-09-24 DIAGNOSIS — I6529 Occlusion and stenosis of unspecified carotid artery: Secondary | ICD-10-CM

## 2016-09-24 DIAGNOSIS — Z794 Long term (current) use of insulin: Secondary | ICD-10-CM

## 2016-09-24 DIAGNOSIS — IMO0002 Reserved for concepts with insufficient information to code with codable children: Secondary | ICD-10-CM

## 2016-09-24 LAB — GLUCOSE, CAPILLARY
GLUCOSE-CAPILLARY: 164 mg/dL — AB (ref 65–99)
GLUCOSE-CAPILLARY: 332 mg/dL — AB (ref 65–99)
Glucose-Capillary: 263 mg/dL — ABNORMAL HIGH (ref 65–99)
Glucose-Capillary: 98 mg/dL (ref 65–99)

## 2016-09-24 LAB — VAS US CAROTID
LEFT ECA DIAS: -76 cm/s
LEFT VERTEBRAL DIAS: -15 cm/s
LICADDIAS: -18 cm/s
LICAPDIAS: 29 cm/s
LICAPSYS: 167 cm/s
Left CCA dist dias: -30 cm/s
Left CCA dist sys: -97 cm/s
Left CCA prox dias: 21 cm/s
Left CCA prox sys: 107 cm/s
Left ICA dist sys: -66 cm/s
RCCAPDIAS: 9 cm/s
RIGHT ECA DIAS: -11 cm/s
RIGHT VERTEBRAL DIAS: 22 cm/s
Right CCA prox sys: 41 cm/s
Right cca dist sys: -75 cm/s

## 2016-09-24 LAB — BASIC METABOLIC PANEL
ANION GAP: 8 (ref 5–15)
BUN: 18 mg/dL (ref 6–20)
CALCIUM: 8.3 mg/dL — AB (ref 8.9–10.3)
CO2: 23 mmol/L (ref 22–32)
Chloride: 108 mmol/L (ref 101–111)
Creatinine, Ser: 1.6 mg/dL — ABNORMAL HIGH (ref 0.44–1.00)
GFR calc Af Amer: 34 mL/min — ABNORMAL LOW (ref >60)
GFR, EST NON AFRICAN AMERICAN: 30 mL/min — AB (ref >60)
Glucose, Bld: 146 mg/dL — ABNORMAL HIGH (ref 65–99)
POTASSIUM: 3.8 mmol/L (ref 3.5–5.1)
SODIUM: 139 mmol/L (ref 135–145)

## 2016-09-24 LAB — TROPONIN I: Troponin I: 1.86 ng/mL (ref <0.03)

## 2016-09-24 MED ORDER — GI COCKTAIL ~~LOC~~
30.0000 mL | Freq: Once | ORAL | Status: AC
  Administered 2016-09-24: 30 mL via ORAL
  Filled 2016-09-24: qty 30

## 2016-09-24 MED ORDER — INSULIN GLARGINE 100 UNIT/ML ~~LOC~~ SOLN
18.0000 [IU] | Freq: Every day | SUBCUTANEOUS | Status: DC
  Administered 2016-09-25: 18 [IU] via SUBCUTANEOUS
  Filled 2016-09-24 (×2): qty 0.18

## 2016-09-24 MED ORDER — PREMIER PROTEIN SHAKE
4.0000 [oz_av] | Freq: Two times a day (BID) | ORAL | Status: DC
  Administered 2016-09-24 – 2016-09-26 (×3): 4 [oz_av] via ORAL
  Filled 2016-09-24 (×3): qty 325.31

## 2016-09-24 MED ORDER — INSULIN ASPART 100 UNIT/ML ~~LOC~~ SOLN
4.0000 [IU] | Freq: Three times a day (TID) | SUBCUTANEOUS | Status: DC
  Administered 2016-09-26: 4 [IU] via SUBCUTANEOUS

## 2016-09-24 MED ORDER — HEPARIN (PORCINE) IN NACL 100-0.45 UNIT/ML-% IJ SOLN
550.0000 [IU]/h | INTRAMUSCULAR | Status: DC
  Administered 2016-09-24: 450 [IU]/h via INTRAVENOUS
  Filled 2016-09-24: qty 250

## 2016-09-24 NOTE — Progress Notes (Signed)
SLP Cancellation Note  Patient Details Name: Gwendolyn HeysBessie M Wallace MRN: [id number] DOB: 02-Jul-1936   Cancelled treatment:       Reason Eval/Treat Not Completed: Patient at procedure or test/unavailable   Maxcine Hamaiewonsky, Phenix Vandermeulen 09/24/2016, 11:23 AM   Maxcine HamLaura Paiewonsky, M.A. CCC-SLP [phone]

## 2016-09-24 NOTE — Progress Notes (Signed)
Physical Therapy Treatment Patient Details Name: Gwendolyn Wallace MRN: [id number] DOB: 06/16/1936 Today's Date: 09/24/2016    History of Present Illness 80 yo female s/p fall with babbled speech for 5 minutes. Pt now with resolved speech deficits. MRI- nonspecific L brachium pontis lesion wihtut diffusion restriction may represent a late subacute infarct focus of demyelination No acute abnormality identified PMH:     PT Comments    Pt demo's significant falls risk with generalized weakness R LE worse than L LE, and generalized deconditioning. Pt unsafe to be at home alone, as daughter works during the day. Pt to strongly benefit from ST-SNF to address mentioned deficits to achieve safe mod I level of function for safe transition home.  Follow Up Recommendations  SNF;Supervision/Assistance - 24 hour     Equipment Recommendations  None recommended by PT    Recommendations for Other Services       Precautions / Restrictions Precautions Precautions: Fall Precaution Comments: decreased insight to deficits Restrictions Weight Bearing Restrictions: No    Mobility  Bed Mobility Overal bed mobility: Needs Assistance Bed Mobility: Supine to Sit     Supine to sit: Min guard     General bed mobility comments: increased time, HOB elevated, use of bed rail, labored effort  Transfers Overall transfer level: Needs assistance Equipment used: Ambulation equipment used Transfers: Sit to/from Stand Sit to Stand: Min assist         General transfer comment: assist to power up, increased trunk flexion, pt reaching for object to hold onto  Ambulation/Gait Ambulation/Gait assistance: Min assist Ambulation Distance (Feet): 150 Feet Assistive device: Rolling walker (2 wheeled);None Gait Pattern/deviations: Step-through pattern;Step-to pattern;Decreased stance time - right;Decreased step length - right;Decreased stride length;Shuffle Gait velocity: slow Gait velocity interpretation:  Below normal speed for age/gender General Gait Details: pt amb 10' to bathroom with L HHA however pt very unsteady requiring min/modA to maintain stability, pt reaching for objects with R UE, unable to clear R foot. pt given RW pt demo'd ability to clear R LE foot consistently however pt very unsafe with walker management especially with turning with RW. pt at high risk of tripping over walker. Pt required minA for safe walker management.   Stairs            Wheelchair Mobility    Modified Rankin (Stroke Patients Only) Modified Rankin (Stroke Patients Only) Pre-Morbid Rankin Score: Slight disability Modified Rankin: Moderate disability     Balance Overall balance assessment: Needs assistance Sitting-balance support: Feet supported;No upper extremity supported Sitting balance-Leahy Scale: Fair Sitting balance - Comments: pt donned shoes on however required increased time   Standing balance support: Single extremity supported Standing balance-Leahy Scale: Poor                      Cognition Arousal/Alertness: Awake/alert Behavior During Therapy: WFL for tasks assessed/performed Overall Cognitive Status: Impaired/Different from baseline Area of Impairment: Memory;Problem solving;Safety/judgement;Awareness     Memory: Decreased short-term memory   Safety/Judgement: Decreased awareness of safety;Decreased awareness of deficits Awareness: Emergent Problem Solving: Slow processing;Difficulty sequencing      Exercises      General Comments General comments (skin integrity, edema, etc.): discussed SNF as pt with significant weakness/deconditioning in conjunction with decreased safety awarenss and insight to deficits. pt also taken to the bathroom, pt required v/c's for appropriate hygiene, minA for safe transfer on/off commode with use of grab bar      Pertinent Vitals/Pain Pain Assessment: 0-10 Pain Score:  2  Pain Location: L LE Pain Descriptors / Indicators:  Aching;Dull (from fall) Pain Intervention(s): Monitored during session    Home Living                      Prior Function            PT Goals (current goals can now be found in the care plan section) Acute Rehab PT Goals Patient Stated Goal: go home Progress towards PT goals: Progressing toward goals    Frequency    Min 3X/week      PT Plan Discharge plan needs to be updated    Co-evaluation             End of Session Equipment Utilized During Treatment: Gait belt Activity Tolerance: Patient tolerated treatment well Patient left: in chair;with call bell/phone within reach;with family/visitor present     Time: 1610-9604 PT Time Calculation (min) (ACUTE ONLY): 28 min  Charges:  $Gait Training: 8-22 mins $Therapeutic Activity: 8-22 mins                    G Codes:      Marcene Brawn 09/24/2016, 11:23 AM   Lewis Shock, PT, DPT Pager #: [phone] Office #: [phone]

## 2016-09-24 NOTE — Progress Notes (Signed)
TC to [facility] informed that pt had critical trop 1.86 and HTN 175/95 hr 118 pt is SOB with speaking placed on 2l [place] will continue to monitor. Ilean SkillVeronica Cheyane Ayon LPN

## 2016-09-24 NOTE — Care Management Note (Signed)
Case Management Note  Patient Details  Name: SAILOR HEVIA MRN: [id number] Date of Birth: 1936-04-07  Subjective/Objective:                    Action/Plan: PTand OT recommending SNF today. CM met with the patient and her daughters and they want to take patient home. They state they are able between the two of them and two other family members to provide 24 hour care. CM provided them the list for [facility] agencies that are available. They selected [place]. Santiago Glad with [facility] notified and accepted the referral.   Expected Discharge Date:                  Expected Discharge Plan:  [place]  In-House Referral:     Discharge planning Services  CM Consult  Post Acute Care Choice:    Choice offered to:  Patient, Adult Children  DME Arranged:    DME Agency:     HH Arranged:  PT, OT, Nurse's Aide [place] Agency:  [place]  Status of Service:  Completed, signed off  If discussed at [place] of Stay Meetings, dates discussed:    Additional Comments:  Pollie Friar, RN 09/24/2016, 1:07 PM

## 2016-09-24 NOTE — Progress Notes (Signed)
Shift event note:  Notified by RN at approx 2030 regardng elevated (first) troponin of 1.86. Record indicates pt c/o CP earlier this afternoon, ? 1400. (orders to cycle troponins and obtain EKG were placed at 1414). First troponin drawn at 1839 and was called to RN at 1950 (Amion was down so RN had difficulty contacting this provider initially). EKG obtained. My colleague,  [facility] and noted ST w/ rate of 110, new lateral T-wave inversions with some st depression as compared to EKG from 09/22/2016. Pt currently denies CP. BP 175/95, T-99.3, P-118, R-169 w/ 02 sats of 99% on 2L [place].  Assessment/Plan: 1. Chest pain: Likely NSTEMI per Dr Julian ReilGardner who has reviewed EKG.  Pt w/ h/o CAD s/p CABG in 2000. Echo from 09/23/2016 > EF 50-55%. Discussed pt w/ Dr Shirlee LatchMcLean w/ cardiology service who has agreed to see pt tonight in consultation.  He requested neuro be consulted regarding IV heparin. Spoke w/ Dr Roseanne RenoStewart w/ neurology service who has ok'd "low dose, no bolus" IV heparin. Appreciate cardiology input, recommendations. Will continue to cycle troponin's and monitor closely on telemetry.   Leanne ChangKatherine P. Fonda Rochon, NP-C [facility] Pager [phone]

## 2016-09-24 NOTE — Evaluation (Signed)
Speech Language Pathology Evaluation Patient Details Name: Gwendolyn Wallace MRN: [id number] DOB: [birth year] Today's Date: 09/24/2016 Time: [id number] SLP Time Calculation (min) (ACUTE ONLY): 27 min  Problem List:  Patient Active Problem List   Diagnosis Date Noted  . Unsteadiness on feet   . Kidney disease 09/22/2016  . Leukocytosis 09/22/2016  . TIA (transient ischemic attack) 09/22/2016  . At high risk for falls 04/02/2013  . Osteoarthritis of hand, left 08/26/2012  . Other vitamin B12 deficiency anemia 12/05/2010  . Absolute anemia 03/20/2010  . Personal history of fall 11/08/2009  . DYSTROPHIC NAILS 02/24/2008  . Gastroparesis 05/27/2007  . PVD WITH CLAUDICATION 04/03/2007  . IMPAIRMENT, ONE EYE MODERATE, OTH NEAR-NORMAL 02/25/2007  . Insulin dependent diabetes mellitus (HCC) 02/06/2007  . HYPERLIPIDEMIA 02/06/2007  . TOBACCO DEPENDENCE 02/06/2007  . HYPERTENSION, BENIGN SYSTEMIC 02/06/2007  . CAD (coronary artery disease) 02/06/2007  . Carotid artery stenosis 02/06/2007  . PEPTIC ULCER DIS., UNSPEC. W/O OBSTRUCTION 02/06/2007  . INCONTINENCE, STRESS, FEMALE 02/06/2007  . Osteoporosis, unspecified 02/06/2007  . CEREBROVAS DIS, LATE EFFECTS (S/P CVA) 02/06/2007   Past Medical History:  Past Medical History:  Diagnosis Date  . Carotid stenosis, bilateral 08/07/05   bilat 40-60%  . CVA (cerebral infarction) 07/10/02  . Diabetes mellitus without complication (HCC)   . Gastric bleeding 10/01  . Herpes zoster 4/01  . Hypertension    Past Surgical History:  Past Surgical History:  Procedure Laterality Date  . CATARACT EXTRACTION, BILATERAL  07/30/2006  . CORONARY ARTERY BYPASS GRAFT  03/11/1999  . PANRETINAL PHOTOCOAGULATION  07/10/2002  . PANRETINAL PHOTOCOAGULATION  4/05   HPI:  80 yo female s/p fall with babbled speech for 5 minutes. Pt now with resolved speech deficits. MRI- nonspecific L brachium pontis lesion wihtut diffusion restriction may represent a late subacute  infarct focus of demyelination No acute abnormality identified PMH: hypertension, gastric bleeding, diabetes mellitus, CVA (2003), bilateral carotid stenosis.   Assessment / Plan / Recommendation Clinical Impression  Pt participated in the Cognistat evaluation. She presented with resolved difficulties with verbal expression from admission and per her daughter she is at baseline for speech output. Overall pt presents with a moderate cognitive impairment. She showed impaired selective attention to tasks and emergent awareness of her deficits. Pt was independent with finances and medication PTA, but despite moderate cueing (i.e repetitions, additional time) pt was unsuccessful with calculation tasks. She showed deficits in storage for short term and working memory. Pt had difficulty when following multi-step commands. Recommend cognitive therapy to maximize cognitive functioning with emphasis on memory and safety/judgement with need for 24 hour supervision upon D/C. Will continue to follow acutely.    SLP Assessment  Patient needs continued Speech Lanaguage Pathology Services    Follow Up Recommendations  Home health SLP;24 hour supervision/assistance    Frequency and Duration min 2x/week  2 weeks      SLP Evaluation Cognition  Overall Cognitive Status: Impaired/Different from baseline Arousal/Alertness: Awake/alert Orientation Level: Oriented to person;Oriented to place;Disoriented to time Attention: Selective Selective Attention: Impaired Selective Attention Impairment: Verbal basic Memory: Impaired Memory Impairment: Storage deficit;Decreased short term memory;Retrieval deficit Decreased Short Term Memory: Verbal basic Awareness: Impaired Awareness Impairment: Emergent impairment;Intellectual impairment Problem Solving: Impaired Problem Solving Impairment: Verbal basic Safety/Judgment: Impaired       Comprehension  Auditory Comprehension Overall Auditory Comprehension:  Impaired Yes/No Questions: Not tested Commands: Impaired Multistep Basic Commands: 25-49% accurate Conversation: Simple Interfering Components: Working memory EffectiveTechniques: Repetition;Pausing IT trainerVisual Recognition/Discrimination Discrimination: Not tested Reading  Comprehension Reading Status: Not tested    Expression Expression Primary Mode of Expression: Verbal Verbal Expression Overall Verbal Expression: Impaired Repetition: Impaired Level of Impairment: Sentence level Written Expression Written Expression: Not tested   Oral / Motor  Oral Motor/Sensory Function Overall Oral Motor/Sensory Function: Within functional limits Motor Speech Overall Motor Speech: Appears within functional limits for tasks assessed   GO                   Tollie Eth, Student SLP  Caryl Never 09/24/2016, 4:35 PM

## 2016-09-24 NOTE — Progress Notes (Signed)
ANTICOAGULATION CONSULT NOTE - Initial Consult  Pharmacy Consult for Heparin Indication: chest pain/ACS  Allergies  Allergen Reactions  . Gemfibrozil Other (See Comments)    MYALGIA  . Trulicity [Dulaglutide] Nausea And Vomiting    EXTREME N & V AND LETHARGY  . Tape Rash    Please use paper tape    Patient Measurements: Height: 5\' 3"  (160 cm) Weight: 74 lb (33.6 kg) IBW/kg (Calculated) : 52.4 Heparin Dosing Weight: 34  Vital Signs: Temp: 99.3 F (37.4 C) (10/16 2039) Temp Source: Oral (10/16 2039) BP: 175/95 (10/16 2039) Pulse Rate: 118 (10/16 2039)  Labs:  Recent Labs  09/22/16 0310 09/22/16 1013 09/23/16 0415 09/24/16 0338 09/24/16 1839  HGB  --  9.6* 10.3*  --   --   HCT  --  29.8* 32.5*  --   --   PLT  --  313 305  --   --   LABPROT 13.8  --   --   --   --   INR 1.05  --   --   --   --   CREATININE  --  1.55* 1.46* 1.60*  --   TROPONINI  --   --   --   --  1.86*    Estimated Creatinine Clearance: 15.4 mL/min (by C-G formula based on SCr of 1.6 mg/dL (H)).   Medical History: Past Medical History:  Diagnosis Date  . Carotid stenosis, bilateral 08/07/05   bilat 40-60%  . CVA (cerebral infarction) 07/10/02  . Diabetes mellitus without complication (HCC)   . Gastric bleeding 10/01  . Herpes zoster 4/01  . Hypertension     Medications:  Scheduled:  . aspirin  300 mg Rectal Daily   Or  . aspirin  325 mg Oral Daily  . atorvastatin  40 mg Oral Daily  . heparin  5,000 Units Subcutaneous Q8H  . insulin aspart  0-15 Units Subcutaneous TID WC  . insulin aspart  0-5 Units Subcutaneous QHS  . [START ON 09/25/2016] insulin aspart  4 Units Subcutaneous TID WC  . [START ON 09/25/2016] insulin glargine  18 Units Subcutaneous Daily  . multivitamin with minerals  1 tablet Oral Daily  . pantoprazole  20 mg Oral Daily  . protein supplement shake  4 oz Oral BID BM  . cyanocobalamin  2,000 mcg Oral Daily    Assessment: 80yo female with hx CAD s/p CABG and  B-carotid artery stenosis.  She was admitted on 10/13 after presenting s/p fall with dysarthria, dysphasia, & dizziness.  CT neg & MRI showed a nonspecific L-brachium pontis lesion; neuro felt it was possibly a subacute stroke but a tumor needs to be ruled out..  No tPA was given.    Now with  Troponin 1.86, SOB and HTN/tachycardia- to start heparin with no bolus & targeting low end of goal range.  Pt is on Heparin 5000 units SQ q8, and received a dose at 2145.  Hg has been stable and pltc wnl.  Note, she weighs 33.6kg.   Goal of Therapy:  Heparin level 0.3-0.5 units/ml Monitor platelets by anticoagulation protocol: Yes   Plan:  Heparin 450 units/hr, no bolus Check heparin level in 8hr Daily HL, CBC Watch for s/s of bleeding D/C Heparin SQ  Marisue HumbleKendra Harmonie Verrastro, PharmD Clinical Pharmacist [facility]

## 2016-09-24 NOTE — Progress Notes (Signed)
PROGRESS NOTE  Gwendolyn Wallace ZOX:[id number] DOB: [birth year] DOA: 09/21/2016 PCP: Dwana MelenaZack Hall, MD  Brief History: 80 y.o.femalewith medical history significant forhypertension, insulin-dependent diabetes mellitus, coronary artery disease status post CABG, and bilateral carotid artery stenosis who presents to the emergency department after a fall and reports transient dysarthria, dysphasiaand dizziness on the evening of 09/21/16. [name] daughter noted that the patient's speech was "babbling", and the patient had word finding difficulties at home. The episode lasted approximately 5 minutes. In addition, the patient also reports also for balance resulting in a fall which resulted in lefthip pain. There was no loss of consciousness. Since her mechanical fall, the patient has reported some weakness in her left leg. CT of the brain was unremarkable. MRI of the brain showed nonspecific left brachium pontis lesion. The patient was admitted for further neurologic workup. Neurology was consulted to assist with management.  Assessment/Plan: Dysathria/Dysphasia -likely subacute stroke cerebellum vs brain mass -09/22/16--case discussed with Dr. Shela CommonsJ Xu-->MRI brain WITH gad if renal function improves with IVF -Appreciate Neurology Consult -PT/OT evaluation -CT brain--neg -MRI brain--nonspecific lesion left brachium pontis -MRA brain--asymmetric signal in the right internal carotid artery suggesting decreased profusion in a probable more proximal lesion at the bifurcation. There is moderate narrowing of the supraclinoid left ICA -Carotid Duplex--pending -Echo--pending -LDL--79-->Continue lipitor 40 mg daily -HbA1C--8.6 -Antiplatelet--ASA 325 mg  Carotid artery stenosis  - Ultrasound from April 2016 features bilateral stenosis of 50-70%  - Per report of pt's daughter, she was to follow-up with vascular surgery after the ultrasound but never did  - CUS--Left ICA--40-59%;  R-ICA--1-39% versus low range 40-59% stenosis by plaque morphology and elevated peak systolic velocity  Atypical Chest pain -developed 10/16 afternoon -cycle troponin -EKG  CAD - Status-post CABG in 2000  - No anginal complaints on admission  - Continue ASA and Lipitor;  -metoprolol and lisinopril held at admission    Insulin-dependent DM - HbA1c--8.6 - Managed at home with metformin 1500 mg qD and Tresiba 10 units qD; these will be held while in hospital  - Increase lantus to 18 units -add novolog 4 units with meals  Hypertension  - Managed with Norvasc, lisinopril, HCTZ, and metoprolol at home;  -held on admission given concern for stroke - Resume antihypertensives as appropriate  -hydralazine prn SBP >210  Kidney disease of uncertain chronicity  - Baseline SCr 1.5-1.7? - SCr was 0.76 in 2013, the most recent prior value available  - hold lisinopril -09/23/16--continue IVF--give another liter  Normocytic anemia  - Hgb 11.3 on admission - Baseline Hgb 12-13 - Attributed to B12-deficiency, continue supplementation  Leukocytosis - WBC 12,600 on admission without fever or apparent focus of infection  - Appears to be chronic going back years  - Culture if febrile- remains hemodynamically stable - ?low grade CLL -overall stable   Disposition Plan: Home 09/25/16 if stable Family Communication: Daughters updatedat bedside 10/16  Consultants: Neurology  Code Status: FULL   DVT Prophylaxis: [place] Heparin    Procedures: As Listed in Progress Note Above  Antibiotics: None    Subjective: Patient complains of left-sided chest pain. Denies shortness of breath, nausea, vomiting, diarrhea, abdominal pain. No dysuria or hematuria. No rashes.  Objective: Vitals:   09/24/16 0143 09/24/16 0532 09/24/16 0955 09/24/16 1745  BP: (!) 178/75 (!) 168/84 (!) 183/71 97/63  Pulse: 92 87 93 96  Resp: 18 18 18 19   Temp: 99 F (37.2 C) 99.2 F (37.3 C)  98.4 F (36.9 C) 98 F (36.7 C)  TempSrc: Oral Oral Oral Oral  SpO2: 97% 100% 97% 97%  Weight:      Height:        Intake/Output Summary (Last 24 hours) at 09/24/16 1752 Last data filed at 09/24/16 0430  Gross per 24 hour  Intake              165 ml  Output                0 ml  Net              165 ml   Weight change:  Exam:   General:  Pt is alert, follows commands appropriately, not in acute distress  HEENT: No icterus, No thrush, No neck mass, [place]/AT  Cardiovascular: RRR, S1/S2, no rubs, no gallops  Respiratory: CTA bilaterally, no wheezing, no crackles, no rhonchi  Abdomen: Soft/+BS, non tender, non distended, no guarding  Extremities: No edema, No lymphangitis, No petechiae, No rashes, no synovitis   Data Reviewed: I have personally reviewed following labs and imaging studies Basic Metabolic Panel:  Recent Labs Lab 09/21/16 1809 09/22/16 1013 09/23/16 0415 09/24/16 0338  NA 135 136 139 139  K 4.6 3.6 4.5 3.8  CL 99* 104 108 108  CO2 25 23 20* 23  GLUCOSE 349* 207* 140* 146*  BUN 19 17 17 18   CREATININE 1.66* 1.55* 1.46* 1.60*  CALCIUM 9.0 8.6* 8.5* 8.3*   Liver Function Tests: No results for input(s): AST, ALT, ALKPHOS, BILITOT, PROT, ALBUMIN in the last 168 hours. No results for input(s): LIPASE, AMYLASE in the last 168 hours. No results for input(s): AMMONIA in the last 168 hours. Coagulation Profile:  Recent Labs Lab 09/22/16 0310  INR 1.05   CBC:  Recent Labs Lab 09/21/16 1809 09/22/16 1013 09/23/16 0415  WBC 12.6* 11.9* 13.8*  HGB 11.3* 9.6* 10.3*  HCT 35.1* 29.8* 32.5*  MCV 82.8 82.3 84.9  PLT 395 313 305   Cardiac Enzymes: No results for input(s): CKTOTAL, CKMB, CKMBINDEX, TROPONINI in the last 168 hours. BNP: Invalid input(s): POCBNP CBG:  Recent Labs Lab 09/23/16 1609 09/23/16 2311 09/24/16 0647 09/24/16 1059 09/24/16 1630  GLUCAP 142* 238* 164* 332* 263*   HbA1C:  Recent Labs  09/22/16 0500  HGBA1C 8.6*    Urine analysis:    Component Value Date/Time   COLORURINE YELLOW 09/21/2016 1815   APPEARANCEUR CLEAR 09/21/2016 1815   LABSPEC 1.009 09/21/2016 1815   PHURINE 6.5 09/21/2016 1815   GLUCOSEU 250 (A) 09/21/2016 1815   HGBUR SMALL (A) 09/21/2016 1815   BILIRUBINUR NEGATIVE 09/21/2016 1815   KETONESUR NEGATIVE 09/21/2016 1815   PROTEINUR >300 (A) 09/21/2016 1815   NITRITE NEGATIVE 09/21/2016 1815   LEUKOCYTESUR NEGATIVE 09/21/2016 1815   Sepsis Labs: @LABRCNTIP (procalcitonin:4,lacticidven:4) )No results found for this or any previous visit (from the past 240 hour(s)).   Scheduled Meds: . aspirin  300 mg Rectal Daily   Or  . aspirin  325 mg Oral Daily  . atorvastatin  40 mg Oral Daily  . heparin  5,000 Units Subcutaneous Q8H  . insulin aspart  0-15 Units Subcutaneous TID WC  . insulin aspart  0-5 Units Subcutaneous QHS  . insulin glargine  14 Units Subcutaneous Daily  . multivitamin with minerals  1 tablet Oral Daily  . pantoprazole  20 mg Oral Daily  . protein supplement shake  4 oz Oral BID BM  . cyanocobalamin  2,000 mcg Oral Daily  Continuous Infusions:   Procedures/Studies: Dg Chest 2 View  Result Date: 09/22/2016 CLINICAL DATA:  80 y/o F; status post fall 3 hours ago with dizziness falling onto right hip. EXAM: CHEST  2 VIEW COMPARISON:  07/13/2016 chest radiograph FINDINGS: Stable cardiac silhouette within normal limits. Status post CABG with sternotomy wires aligned and intact. No focal consolidation. No pneumothorax or pleural effusion. No acute osseous abnormality is identified. IMPRESSION: No active cardiopulmonary disease. Electronically Signed   By: Mitzi Hansen M.D.   On: 09/22/2016 04:50   Mr Maxine Glenn Head Wo Contrast  Result Date: 09/23/2016 CLINICAL DATA:  Left middle cerebellar peduncle lesion. Episode of dizziness and headaches. EXAM: MRA HEAD WITHOUT CONTRAST TECHNIQUE: Angiographic images of the Circle of Willis were obtained using MRA  technique without intravenous contrast. COMPARISON:  MRI brain 09/22/2016. FINDINGS: There is asymmetric signal in the right internal carotid artery suggesting decreased profusion in a probable more proximal lesion at the bifurcation. There is moderate narrowing of the supraclinoid left ICA. A left posterior communicating artery aneurysm measures 3 mm. The right A1 segment is not visualized. The left A1 and M1 segments demonstrate mild proximal narrowing without a significant stenosis. The anterior communicating artery is patent. MCA bifurcations are intact bilaterally. There is moderate distal attenuation of MCA branch vessels bilaterally without a focal proximal stenosis. 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 The right vertebral artery is the dominant vessel. Right PICA origin is visualized and normal. The proximal left vertebral artery is poorly seen. Irregularity is noted in the proximal basilar artery without a significant stenosis relative to the more distal vessel. Both posterior cerebral arteries originate from the basilar tip. There is moderate attenuation of distal branches. IMPRESSION: 1. Asymmetric signal in the right internal carotid artery suggesting right-sided bifurcation or more proximal stenosis and decreased perfusion. 2. Moderate stenosis of the supraclinoid left internal carotid artery. 3. 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 4. Diffuse distal small vessel disease without a significant proximal stenosis or occlusion in either the anterior posterior circulation otherwise. 5. Atherosclerotic changes within the proximal basilar artery without a significant stenosis relative to the more distal vessel. Electronically Signed   By: Marin Roberts M.D.   On: 09/23/2016 15:59   Mr Brain Wo Contrast  Result Date: 09/22/2016 CLINICAL DATA:  80 y/o F; episode of dizziness and headaches accompanied by dysarthria lasting 5 minutes. EXAM: MRI  HEAD WITHOUT CONTRAST TECHNIQUE: Multiplanar, multiecho pulse sequences of the brain and surrounding structures were obtained without intravenous contrast. COMPARISON:  None. FINDINGS: Brain: No acute infarction, hemorrhage, hydrocephalus, extra-axial collection or mass lesion.Moderate chronic microvascular ischemic changes and parenchymal volume loss of the brain. 13 mm diffusion hyperintense, T2 hyperintense, T1 hypointense focus within the left brachium pontis without low ADC (series 5, image 14). Advanced chronic microvascular ischemic changes and parenchymal volume loss of the brain. Vascular: Normal flow voids. Skull and upper cervical spine: Normal marrow signal. Sinuses/Orbits: Complete opacification of left maxillary sinus, left anterior ethmoid air cells, and left frontal sinus, the left middle meatus obstructive pattern. Moderate mucosal thickening of the right maxillary sinus. Partial opacification of left mastoid tip. Orbits are grossly unremarkable. Other: None. IMPRESSION: Motion degradation of several sequences. 1. Nonspecific left brachium pontis lesion without diffusion restriction may represent a late subacute infarct, focus of demyelination, and mass is not excluded. Consider contrast CT or MRI for further characterization. 2. No acute intracranial abnormality identified. 3. Advanced chronic microvascular ischemic changes and  parenchymal volume loss. Electronically Signed   By: Mitzi Hansen M.D.   On: 09/22/2016 02:46   Dg Hip Unilat W Or Wo Pelvis 2-3 Views Left  Result Date: 09/21/2016 CLINICAL DATA:  Status post fall on left side, with left hip pain. Initial encounter. EXAM: DG HIP (WITH OR WITHOUT PELVIS) 2-3V LEFT COMPARISON:  None. FINDINGS: There is no evidence of fracture or dislocation. The left femoral head remains seated at the acetabulum. The visualized portions of the left sacroiliac joint are grossly unremarkable. Diffuse vascular calcifications are seen. The  visualized bowel gas pattern is unremarkable. IMPRESSION: No evidence of fracture or dislocation. Diffuse vascular calcifications seen. Electronically Signed   By: Roanna Raider M.D.   On: 09/21/2016 18:55   Dg Hip Unilat W Or Wo Pelvis 2-3 Views Right  Result Date: 09/21/2016 CLINICAL DATA:  Initial evaluation for acute trauma, fall. EXAM: DG HIP (WITH OR WITHOUT PELVIS) 2-3V RIGHT COMPARISON:  None available. FINDINGS: No acute fracture or dislocation. Femoral heads in normal alignment with the acetabula. Femoral head heights preserved. Bony pelvis intact. SI joints approximated. No pubic diastasis. Moderate degenerative osteoarthritic changes about the hips bilaterally. Prominent degenerative changes noted within the lower lumbar spine. Diffuse osteopenia. Prominent vascular calcifications noted. Surgical clips overlie the right inguinal region. IMPRESSION: 1. No acute fracture or dislocation about the right hip. 2. Osteopenia. 3. Atherosclerosis. Electronically Signed   By: Rise Mu M.D.   On: 09/21/2016 18:52    Kathelene Rumberger, DO  [facility] Pager [phone]  If 7PM-7AM, please contact night-coverage [email or web address] Password [email or web address] 09/24/2016, 5:52 PM   LOS: 2 days

## 2016-09-24 NOTE — Progress Notes (Signed)
*  PRELIMINARY RESULTS* Vascular Ultrasound Carotid Duplex (Doppler) has been completed.  Findings suggest upper range 1-39% versus low range 40-59% stenosis by plaque morphology and elevated peak systolic velocity. Left internal carotid artery exhibits elevated velocities suggestive of 40-59% stenosis. The left external carotid artery exhibits significantly elevated velocities suggestive of >50% stenosis. The right vertebral artery is patent with antegrade flow. The left vertebral artery is patent with retrograde flow, and the left subclavian artery exhibits monophasic waveforms; suggestive of possible proximal stenosis.   09/24/2016 12:07 PM Gertie FeyMichelle Mildred Tuccillo, BS, RVT, RDCS, RDMS

## 2016-09-24 NOTE — Progress Notes (Signed)
CRITICAL VALUE ALERT  Critical value received:  Trop 1.86  Date of notification:  09/24/2016  Time of notification:  1950  Critical value read back:yes  Nurse who received alert:  Ilean SkillVeronica Janaiyah Blackard LPN  MD notified (1st page):  [facility]  Time of first page:  2000 found out that Amion was down   MD notified (2nd page):  Time of second page:  Responding MD: [facility]  Time MD responded:  2035

## 2016-09-24 NOTE — Progress Notes (Signed)
Occupational Therapy Treatment Patient Details Name: Gwendolyn HeysBessie Wallace Overall MRN: [id number] DOB: [birth year] Today's Date: 09/24/2016    History of present illness 80 yo female s/p fall with babbled speech for 5 minutes. Pt now with resolved speech deficits. MRI- nonspecific L brachium pontis lesion wihtut diffusion restriction may represent a late subacute infarct focus of demyelination No acute abnormality identified PMH:    OT comments  Pt not feeling well today upon returning from test but agreed to therapy.  Pt BP was 116/80.  Pt wants to return home and is asking a family member, Boyd Kerbsenny, if she can stay with her 24/7.  If so, rec HHOT.  If 24 hour assist not available, pt will need SNF.  Family looking into this today.  Pt continues to be unsteady on her feet during all adls and cannot be on her feet unassisted.    Follow Up Recommendations  Home health OT;Supervision - Intermittent    Equipment Recommendations  None recommended by OT    Recommendations for Other Services      Precautions / Restrictions Precautions Precautions: Fall Precaution Comments: decreased insight to deficits Restrictions Weight Bearing Restrictions: No       Mobility Bed Mobility               General bed mobility comments: pt in chair on arrival.  Transfers Overall transfer level: Needs assistance Equipment used: Rolling walker (2 wheeled) Transfers: Sit to/from Stand Sit to Stand: Min assist         General transfer comment: assist to power up, increased trunk flexion, pt reaching for object to hold onto    Balance Overall balance assessment: Needs assistance Sitting-balance support: Feet supported Sitting balance-Leahy Scale: Good     Standing balance support: Bilateral upper extremity supported;During functional activity Standing balance-Leahy Scale: Poor Standing balance comment: pt must have outside support to remain safely standing.                   ADL Overall ADL's :  Needs assistance/impaired Eating/Feeding: Set up   Grooming: Wash/dry hands;Wash/dry face;Oral care;Supervision/safety;Sitting (did sitting due to not feeling well.)               Lower Body Dressing: Minimal assistance;Sit to/from stand Lower Body Dressing Details (indicate cue type and reason): min assist to maintain balance in standing.  Pt takes a great amount of time to dress. Toilet Transfer: Minimal assistance;RW;Regular Toilet;Grab bars StatisticianToilet Transfer Details (indicate cue type and reason): Pt unsteady with use of walker in bathroom.  Pt leaves walker at times and tries to walk without it requring cues to keep walker with her at all times. Toileting- ArchitectClothing Manipulation and Hygiene: Min guard;Sit to/from stand Toileting - Clothing Manipulation Details (indicate cue type and reason): min guard for balance in standing.     Functional mobility during ADLs: Minimal assistance;Rolling walker General ADL Comments: Pt not wanting to use walker during adls but actually is safer with the walker due to decreased balance.  Pt forgets to take walker with her at all times which is a concern and shows ST memory deficts as she has been told to use the walker at all times.      Vision                     Perception     Praxis      Cognition   Behavior During Therapy: [facility] for tasks assessed/performed Overall Cognitive Status: Impaired/Different from baseline  Area of Impairment: Memory;Problem solving;Safety/judgement;Awareness     Memory: Decreased short-term memory    Safety/Judgement: Decreased awareness of safety;Decreased awareness of deficits Awareness: Emergent Problem Solving: Slow processing;Difficulty sequencing General Comments: Pt not feeling great.  Felt nauseous.  BP 116/80.      Extremity/Trunk Assessment               Exercises     Shoulder Instructions       General Comments      Pertinent Vitals/ Pain       Pain Assessment: No/denies  pain  Home Living                                          Prior Functioning/Environment              Frequency  Min 2X/week        Progress Toward Goals  OT Goals(current goals can now be found in the care plan section)  Progress towards OT goals: Progressing toward goals  Acute Rehab OT Goals Patient Stated Goal: go home OT Goal Formulation: With patient/family Time For Goal Achievement: 10/07/16 Potential to Achieve Goals: Good ADL Goals Pt Will Perform Grooming: with supervision;standing Pt Will Perform Upper Body Bathing: with supervision;standing Pt Will Transfer to Toilet: with supervision;ambulating;regular height toilet Additional ADL Goal #1: pt will complete bed mobility supervision level for all adls.   Plan Discharge plan remains appropriate    Co-evaluation                 End of Session Equipment Utilized During Treatment: Rolling walker   Activity Tolerance Other (comment) (pt not feeling well.)   Patient Left in chair;with call bell/phone within reach;with family/visitor present   Nurse Communication Mobility status        Time: 1610-9604 OT Time Calculation (min): 23 min  Charges: OT General Charges $OT Visit: 1 Procedure OT Treatments $Self Care/Home Management : 23-37 mins  [name] 09/24/2016, 12:43 PM  [phone]

## 2016-09-24 NOTE — Consult Note (Signed)
            [facility]  09/24/2016  Gwendolyn Wallace 06/19/1936 [id number]  Went to see patient at the bedside to identify possible discharge needs. Patient is sleeping soundly and no family member seen at bedside.  Electronic medical record reveals that patient lives with children. PT and OT had recommended SNF placement but daughters preferred for patient to discharge home with home health services ([facility]) and will be taken cared off by 2 daughters and 2 other family members who will provide 24 hour care for her.  For additional questions please contact:  Karin GoldenLorraine A. Josecarlos Harriott, BSN, RN-BC [facility] PRIMARY CARE Navigator Cell: [phone]

## 2016-09-24 NOTE — NC FL2 (Signed)
[place] MEDICAID FL2 LEVEL OF CARE SCREENING TOOL     IDENTIFICATION  Patient Name: Gwendolyn Wallace Birthdate: 06/08/1936 Sex: female Admission Date (Current Location): 09/21/2016  [facility] and [place] Number:  Producer, television/film/video and Address:  [facility] [place], [address], [place], [place] [address]      Provider Number: [id number]  Attending Physician Name and Address:  Catarina Hartshorn, MD  Relative Name and Phone Number:       Current Level of Care: Hospital Recommended Level of Care: Skilled Nursing Facility Prior Approval Number:    Date Approved/Denied:   PASRR Number: [id number]  Discharge Plan: SNF    Current Diagnoses: Patient Active Problem List   Diagnosis Date Noted  . Unsteadiness on feet   . Kidney disease 09/22/2016  . Leukocytosis 09/22/2016  . TIA (transient ischemic attack) 09/22/2016  . At high risk for falls 04/02/2013  . Osteoarthritis of hand, left 08/26/2012  . Other vitamin B12 deficiency anemia 12/05/2010  . Absolute anemia 03/20/2010  . Personal history of fall 11/08/2009  . DYSTROPHIC NAILS 02/24/2008  . Gastroparesis 05/27/2007  . PVD WITH CLAUDICATION 04/03/2007  . IMPAIRMENT, ONE EYE MODERATE, OTH NEAR-NORMAL 02/25/2007  . Insulin dependent diabetes mellitus (HCC) 02/06/2007  . HYPERLIPIDEMIA 02/06/2007  . TOBACCO DEPENDENCE 02/06/2007  . HYPERTENSION, BENIGN SYSTEMIC 02/06/2007  . CAD (coronary artery disease) 02/06/2007  . Carotid artery stenosis 02/06/2007  . PEPTIC ULCER DIS., UNSPEC. W/O OBSTRUCTION 02/06/2007  . INCONTINENCE, STRESS, FEMALE 02/06/2007  . Osteoporosis, unspecified 02/06/2007  . CEREBROVAS DIS, LATE EFFECTS (S/P CVA) 02/06/2007    Orientation RESPIRATION BLADDER Height & Weight     Self, Time, Situation, Place  Normal Incontinent Weight: 74 lb (33.6 kg) Height:  5\' 3"  (160 cm)  BEHAVIORAL SYMPTOMS/MOOD NEUROLOGICAL BOWEL NUTRITION STATUS      Continent  (Carb Modified Diet, Thin  Liquids)  AMBULATORY STATUS COMMUNICATION OF NEEDS Skin   Extensive Assist Verbally Normal                       Personal Care Assistance Level of Assistance  Bathing, Feeding, Dressing Bathing Assistance: Limited assistance Feeding assistance: Independent Dressing Assistance: Limited assistance     Functional Limitations Info  Sight, Hearing, Speech Sight Info: Adequate Hearing Info: Adequate Speech Info: Adequate    SPECIAL CARE FACTORS FREQUENCY  PT (By licensed PT), OT (By licensed OT), Speech therapy     PT Frequency: 5 OT Frequency: 5     Speech Therapy Frequency: 5      Contractures Contractures Info: Not present    Additional Factors Info  Code Status, Allergies, Insulin Sliding Scale Code Status Info: Full Code Allergies Info: Gemfibrozil, Trulicity Dulaglutide, Tape   Insulin Sliding Scale Info: 4x/day       Current Medications (09/24/2016):  This is the current hospital active medication list Current Facility-Administered Medications  Medication Dose Route Frequency Provider Last Rate Last Dose  . acetaminophen (TYLENOL) tablet 650 mg  650 mg Oral Q6H PRN Catarina Hartshorn, MD   650 mg at 09/23/16 0959  . aspirin suppository 300 mg  300 mg Rectal Daily Briscoe Deutscher, MD       Or  . aspirin tablet 325 mg  325 mg Oral Daily Briscoe Deutscher, MD   325 mg at 09/24/16 1038  . atorvastatin (LIPITOR) tablet 40 mg  40 mg Oral Daily Marvel Plan, MD   40 mg at 09/24/16 1038  .  heparin injection 5,000 Units  5,000 Units Subcutaneous Q8H Briscoe Deutscherimothy S Opyd, MD   5,000 Units at 09/24/16 0706  . insulin aspart (novoLOG) injection 0-15 Units  0-15 Units Subcutaneous TID WC Briscoe Deutscherimothy S Opyd, MD   3 Units at 09/24/16 0707  . insulin aspart (novoLOG) injection 0-5 Units  0-5 Units Subcutaneous QHS Briscoe Deutscherimothy S Opyd, MD   3 Units at 09/23/16 2322  . insulin glargine (LANTUS) injection 14 Units  14 Units Subcutaneous Daily Catarina Hartshornavid Tat, MD   14 Units at 09/24/16 1039  . metoCLOPramide  (REGLAN) tablet 5 mg  5 mg Oral Q6H PRN Briscoe Deutscherimothy S Opyd, MD      . multivitamin with minerals tablet 1 tablet  1 tablet Oral Daily Briscoe Deutscherimothy S Opyd, MD   1 tablet at 09/24/16 1038  . pantoprazole (PROTONIX) EC tablet 20 mg  20 mg Oral Daily Briscoe Deutscherimothy S Opyd, MD   20 mg at 09/24/16 1038  . protein supplement (PREMIER PROTEIN) liquid  4 oz Oral BID BM Catarina Hartshornavid Tat, MD      . senna-docusate (Senokot-S) tablet 1 tablet  1 tablet Oral QHS PRN Briscoe Deutscherimothy S Opyd, MD      . vitamin B-12 (CYANOCOBALAMIN) tablet 2,000 mcg  2,000 mcg Oral Daily Briscoe Deutscherimothy S Opyd, MD   2,000 mcg at 09/24/16 1038     Discharge Medications: Please see discharge summary for a list of discharge medications.  Relevant Imaging Results:  Relevant Lab Results:   Additional Information SSN:  [id number]  Dede QuerySarah Francie Keeling, LCSW

## 2016-09-24 NOTE — Progress Notes (Addendum)
Initial Nutrition Assessment  DOCUMENTATION CODES:   Underweight  INTERVENTION:  Provide Premier Protein Shake once daily Provide snacks BID F/up at later date to re-assess needs  NUTRITION DIAGNOSIS:   Inadequate oral intake related to poor appetite as evidenced by  .   GOAL:   Patient will meet greater than or equal to 90% of their needs   MONITOR:   PO intake, Supplement acceptance, Skin, I & O's, Labs, Weight trends  REASON FOR ASSESSMENT:   Malnutrition Screening Tool    ASSESSMENT:   [name] is a 80 y.o. female with history of a previous stroke, hypertension, ongoing tobacco use, herpes zoster, GI bleed, diabetes mellitus, and bilateral carotid stenosis presenting with a mechanical fall, dizziness, mild headache, and transient speech difficulties.    Pt out of room to vascular lab at time of visit. Family at bedside state that patient has not been interested in eating and has been losing weight. They report that pt will not drink Ensure, but will drink Premier Protein Shakes.  Per weight history, pt has lost from 100 lbs to 74 lbs in the past 2.5 months- 26% weight loss is severe for time frame.  RD revisited pt room again this afternoon. Pt getting ready to head to bathroom. Daughter reported that patient would like snacks.   Labs: elevated glucose ranging 164 to 332 mg/dL, low calcium, low GFR, low hemoglobin  Diet Order:  Diet Carb Modified Fluid consistency: Thin; Room service appropriate? Yes  Skin:  Reviewed, no issues  Last BM:  unknown  Height:   Ht Readings from Last 1 Encounters:  09/23/16 5\' 3"  (1.6 m)    Weight:   Wt Readings from Last 1 Encounters:  09/23/16 74 lb (33.6 kg)    Ideal Body Weight:  52.3 kg  BMI:  Body mass index is 13.11 kg/m.  Estimated Nutritional Needs:   Kcal:  1200-1400  Protein:  48-55 grams  Fluid:  1.2 L/day  EDUCATION NEEDS:   No education needs identified at this time  Dorothea Ogleeanne Tamura Lasky  RD, CSP, LDN Inpatient Clinical Dietitian Pager: [phone] After Hours Pager: [phone]

## 2016-09-24 NOTE — Progress Notes (Signed)
STROKE TEAM PROGRESS NOTE   SUBJECTIVE (INTERVAL HISTORY) Patient lying in the bed. No new complaints. Takes aspirin, but not routinely.Daughter at the bedside. Patient on her way to vascular lab for testing   OBJECTIVE Temp:  [98.4 F (36.9 C)-99.2 F (37.3 C)] 98.4 F (36.9 C) (10/16 0955) Pulse Rate:  [75-93] 93 (10/16 0955) Cardiac Rhythm: Normal sinus rhythm (10/16 0750) Resp:  [18] 18 (10/16 0955) BP: (85-183)/(50-84) 183/71 (10/16 0955) SpO2:  [97 %-100 %] 97 % (10/16 0955)  CBC:   Recent Labs Lab 09/22/16 1013 09/23/16 0415  WBC 11.9* 13.8*  HGB 9.6* 10.3*  HCT 29.8* 32.5*  MCV 82.3 84.9  PLT 313 305    Basic Metabolic Panel:   Recent Labs Lab 09/23/16 0415 09/24/16 0338  NA 139 139  K 4.5 3.8  CL 108 108  CO2 20* 23  GLUCOSE 140* 146*  BUN 17 18  CREATININE 1.46* 1.60*  CALCIUM 8.5* 8.3*    Lipid Panel:     Component Value Date/Time   CHOL 142 09/22/2016 0500   TRIG 131 09/22/2016 0500   HDL 37 (L) 09/22/2016 0500   CHOLHDL 3.8 09/22/2016 0500   VLDL 26 09/22/2016 0500   LDLCALC 79 09/22/2016 0500   HgbA1c:  Lab Results  Component Value Date   HGBA1C 8.6 (H) 09/22/2016   Urine Drug Screen: No results found for: LABOPIA, COCAINSCRNUR, LABBENZ, AMPHETMU, THCU, LABBARB    IMAGING  Mr Brain Wo Contrast 09/22/2016 1. Nonspecific left brachium pontis lesion without diffusion restriction may represent a late subacute infarct, focus of demyelination, and mass is not excluded. Consider contrast CT or MRI for further characterization.  2. No acute intracranial abnormality identified.  3. Advanced chronic microvascular ischemic changes and parenchymal volume loss.   MRA  09/23/2016 1. Asymmetric signal in the right internal carotid artery suggesting right-sided bifurcation or more proximal stenosis and decreased perfusion. 2. Moderate stenosis of the supraclinoid left internal carotid artery. 3. 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  4. Diffuse distal small vessel disease without a significant proximal stenosis or occlusion in either the anterior posterior circulation otherwise. 5. Atherosclerotic changes within the proximal basilar artery without a significant stenosis relative to the more distal vessel.   2D Echocardiogram  - Left ventricle: The cavity size was normal. Wall thickness was  increased in a pattern of mild LVH. Systolic function was at the lower limits of normal. The estimated ejection fraction was in the range of 50% to 55%. Features are consistent with a pseudonormal left ventricular filling pattern, with concomitant abnormal relaxation and increased filling pressure (grade 2 diastolic dysfunction). Doppler parameters are consistent with high ventricular filling pressure. - Mitral valve: Severely calcified annulus. There was moderate regurgitation. - Left atrium: The atrium was severely dilated. - Right ventricle: Systolic function was moderately reduced. - Atrial septum: No defect or patent foramen ovale was identified. - Tricuspid valve: There was moderate regurgitation. - Pulmonary arteries: PA peak pressure: 47 mm Hg (S).  CUS pending    PHYSICAL EXAM General - Very thin elderly woman resting comfortably in bed, no acute distress.Marland Kitchen.  Ophthalmologic - Fundi not visualized due to eye movement.  Cardiovascular - Regular rate and rhythm.  Mental Status -  Level of arousal and orientation to month, place, and person were intact, but not orientated to year and age. Language including expression, naming, repetition, comprehension was assessed and found intact. Fund of Knowledge was assessed and was intact.  Cranial Nerves II -  XII - II - Visual field intact OU. III, IV, VI - Extraocular movements intact. V - Facial sensation intact bilaterally. VII - Facial movement intact bilaterally. VIII - Hearing & vestibular intact bilaterally. X - Palate elevates symmetrically. XI - Chin turning & shoulder  shrug intact bilaterally. XII - Tongue protrusion intact.  Motor Strength - [name] strength was 4/5 in all extremities and pronator drift was absent.  Bulk was decreased throughout and fasciculations were absent.   Motor Tone - Muscle tone was assessed at the neck and appendages and was normal.  Reflexes - [name] reflexes were 1+ in all extremities and she had no pathological reflexes.  Sensory - Light touch, temperature/pinprick were assessed and were symmetrical.    Coordination - The patient had normal movements in the hands with no ataxia or dysmetria.  Tremor was absent.  Gait and Station - deferred due to safety concerns.   ASSESSMENT/PLAN Gwendolyn Wallace is a 80 y.o. female with history of a previous stroke, hypertension, ongoing tobacco use, herpes zoster, GI bleed, diabetes mellitus, and bilateral carotid stenosis presenting with a mechanical fall, dizziness, mild headache, and transient speech difficulties.  She did not receive IV t-PA due to minimal deficits.  Possible subacute stroke at left cerebellar peduncle. However, DWI signal atypical, need to rule out tumor. Due to decreased GFR, recommend MRI brain  with contrast as outpatient once GFR improves.  Resultant - resolution of deficits.  MRI - DWI signal change at left brachium pontis lesion, possible subacute infarct, but tumor cannot be ruled out.   Consider MRI with contrast as outpt once GFR improves  2D Echo - EF 50-55%. No source of embolus. LA 39   Carotid Doppler - .pending   MRA head - R ICA decreased perfusion ? prox stenosis. L ICA supraclinoid mod stenosis. 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   LDL - 79  HgbA1c 8.6  VTE prophylaxis - subcutaneous heparin Diet Carb Modified Fluid consistency: Thin; Room service appropriate? Yes  aspirin 81 mg daily prior to admission but admitted to inconsistent use, now on aspirin 325 mg daily.   Patient counseled to be compliant with her antithrombotic  medications  Ongoing aggressive stroke risk factor management  Therapy recommendations: supervision, HH PT, HH OT  Disposition: [facility] with [facility]  Elevated creatinine / decreased GFR   Cre 1.74->1.66->1.55->1.46  Still low GFR, radiology not recommending MRI or CT with contrast  Consider MRI with contrast as outpt once GFR improves  Will do carotid Doppler and MRA head this time for stroke workup  Recommend adequate hydration at home  Bilateral carotid stenosis  03/2015 CUS bilateral 50-69% stenosis  CUS pending   Hypertension  Blood pressure somewhat low at times  Permissive hypertension (OK if < 220/120) but gradually normalize in 5-7 days  Long-term BP goal normotensive  Hyperlipidemia  Home meds: Lipitor 40 mg daily prior to admission.  LDL 79, goal < 70  Lipitor resumed  Continue statin at discharge  Diabetes  HgbA1c 8.6, goal < 7.0  Uncontrolled  On Lantus  SSI  CBG showed hyperglycemia  Tobacco abuse  Current smoker  Smoking cessation counseling provided  Pt is willing to quit  Other Stroke Risk Factors  Advanced age  Hx stroke/TIA - 17 years ago with right arm weakness, no residual deficit  Family hx stroke (brother)  Other Active Problems  Normocytotic anemia 9.6 / 29.8  Mild leukocytosis - 11.9 -> 13.8 (afebrile)  Kidney disease of uncertain chronicity  Hospital day # 2  Rhoderick Moody [facility] See Amion for Pager information 09/24/2016 11:02 AM  I have personally examined this patient, reviewed notes, independently viewed imaging studies, participated in medical decision making and plan of care.ROS completed by me personally and pertinent positives fully documented  I have made any additions or clarifications directly to the above note. Agree with note above. I had a long discussion the patient and daughter at the bedside and discuss results of MRI findings possibly suggesting subacute infarct since instruction  lesion would be less likely. Recommend continue ongoing stroke evaluation and she will need outpatient MRI with contrast if renal function improves. Greater than 50% time during this 25 minute visit was spent on counseling and coordination of care about her presentation, MRI findings and answered questions.  Delia Heady, MD Medical Director [facility] Pager: [phone] 09/24/2016 1:34 PM  To contact Stroke Continuity provider, please refer to [email or web address]. After hours, contact General Neurology

## 2016-09-25 ENCOUNTER — Encounter (HOSPITAL_COMMUNITY)
Admission: EM | Disposition: A | Discharge status: Home / Self Care | Payer: Self-pay | Source: Home / Self Care | Attending: Internal Medicine

## 2016-09-25 DIAGNOSIS — G450 Vertebro-basilar artery syndrome: Secondary | ICD-10-CM

## 2016-09-25 DIAGNOSIS — G458 Other transient cerebral ischemic attacks and related syndromes: Secondary | ICD-10-CM

## 2016-09-25 DIAGNOSIS — I119 Hypertensive heart disease without heart failure: Secondary | ICD-10-CM

## 2016-09-25 DIAGNOSIS — I2510 Atherosclerotic heart disease of native coronary artery without angina pectoris: Secondary | ICD-10-CM

## 2016-09-25 DIAGNOSIS — I6339 Cerebral infarction due to thrombosis of other cerebral artery: Secondary | ICD-10-CM

## 2016-09-25 DIAGNOSIS — I214 Non-ST elevation (NSTEMI) myocardial infarction: Secondary | ICD-10-CM

## 2016-09-25 DIAGNOSIS — I5033 Acute on chronic diastolic (congestive) heart failure: Secondary | ICD-10-CM

## 2016-09-25 HISTORY — PX: CARDIAC CATHETERIZATION: SHX172

## 2016-09-25 LAB — POCT I-STAT, CHEM 8
BUN: 20 mg/dL (ref 6–20)
CALCIUM ION: 1.17 mmol/L (ref 1.15–1.40)
CREATININE: 1.7 mg/dL — AB (ref 0.44–1.00)
Chloride: 108 mmol/L (ref 101–111)
GLUCOSE: 105 mg/dL — AB (ref 65–99)
HCT: 28 % — ABNORMAL LOW (ref 36.0–46.0)
Hemoglobin: 9.5 g/dL — ABNORMAL LOW (ref 12.0–15.0)
POTASSIUM: 4.3 mmol/L (ref 3.5–5.1)
Sodium: 137 mmol/L (ref 135–145)
TCO2: 19 mmol/L (ref 0–100)

## 2016-09-25 LAB — CBC
HCT: 29.5 % — ABNORMAL LOW (ref 36.0–46.0)
Hemoglobin: 9.4 g/dL — ABNORMAL LOW (ref 12.0–15.0)
MCH: 26.4 pg (ref 26.0–34.0)
MCHC: 31.9 g/dL (ref 30.0–36.0)
MCV: 82.9 fL (ref 78.0–100.0)
Platelets: 349 10*3/uL (ref 150–400)
RBC: 3.56 MIL/uL — ABNORMAL LOW (ref 3.87–5.11)
RDW: 14.8 % (ref 11.5–15.5)
WBC: 11.4 10*3/uL — ABNORMAL HIGH (ref 4.0–10.5)

## 2016-09-25 LAB — MRSA PCR SCREENING: MRSA BY PCR: NEGATIVE

## 2016-09-25 LAB — POCT ACTIVATED CLOTTING TIME: Activated Clotting Time: 114 seconds

## 2016-09-25 LAB — GLUCOSE, CAPILLARY
GLUCOSE-CAPILLARY: 141 mg/dL — AB (ref 65–99)
Glucose-Capillary: 250 mg/dL — ABNORMAL HIGH (ref 65–99)
Glucose-Capillary: 90 mg/dL (ref 65–99)
Glucose-Capillary: 84 mg/dL (ref 65–99)
Glucose-Capillary: 77 mg/dL (ref 65–99)

## 2016-09-25 LAB — TROPONIN I
Troponin I: 5.29 ng/mL (ref <0.03)
Troponin I: 13.08 ng/mL (ref <0.03)
Troponin I: 16.6 ng/mL (ref <0.03)

## 2016-09-25 LAB — HEPARIN LEVEL (UNFRACTIONATED): Heparin Unfractionated: 0.16 IU/mL — ABNORMAL LOW (ref 0.30–0.70)

## 2016-09-25 SURGERY — LEFT HEART CATH AND CORONARY ANGIOGRAPHY
Anesthesia: LOCAL

## 2016-09-25 SURGERY — LEFT HEART CATH AND CORONARY ANGIOGRAPHY

## 2016-09-25 MED ORDER — IOPAMIDOL (ISOVUE-370) INJECTION 76%
INTRAVENOUS | Status: DC | PRN
  Administered 2016-09-25: 60 mL via INTRA_ARTERIAL

## 2016-09-25 MED ORDER — METOPROLOL TARTRATE 25 MG PO TABS
25.0000 mg | ORAL_TABLET | Freq: Four times a day (QID) | ORAL | Status: DC
  Administered 2016-09-25 – 2016-09-26 (×4): 25 mg via ORAL
  Filled 2016-09-25 (×5): qty 1

## 2016-09-25 MED ORDER — SODIUM CHLORIDE 0.9 % IV SOLN: 250.0000 mL | INTRAVENOUS | Status: DC | PRN

## 2016-09-25 MED ORDER — HEPARIN (PORCINE) IN NACL 2-0.9 UNIT/ML-% IJ SOLN
INTRAMUSCULAR | Status: AC
  Filled 2016-09-25: qty 500

## 2016-09-25 MED ORDER — MIDAZOLAM HCL 2 MG/2ML IJ SOLN
INTRAMUSCULAR | Status: AC
  Filled 2016-09-25: qty 2

## 2016-09-25 MED ORDER — LIDOCAINE HCL (PF) 1 % IJ SOLN
INTRAMUSCULAR | Status: AC
  Filled 2016-09-25: qty 30

## 2016-09-25 MED ORDER — IOPAMIDOL (ISOVUE-370) INJECTION 76%
INTRAVENOUS | Status: AC
  Filled 2016-09-25: qty 100

## 2016-09-25 MED ORDER — ASPIRIN EC 81 MG PO TBEC
81.0000 mg | DELAYED_RELEASE_TABLET | Freq: Every day | ORAL | Status: DC
  Administered 2016-09-25 – 2016-09-26 (×2): 81 mg via ORAL
  Filled 2016-09-25 (×2): qty 1

## 2016-09-25 MED ORDER — NITROGLYCERIN IN D5W 200-5 MCG/ML-% IV SOLN
2.0000 ug/min | INTRAVENOUS | Status: DC
  Filled 2016-09-25: qty 250

## 2016-09-25 MED ORDER — POTASSIUM CHLORIDE CRYS ER 20 MEQ PO TBCR
20.0000 meq | EXTENDED_RELEASE_TABLET | Freq: Once | ORAL | Status: AC
  Administered 2016-09-25: 20 meq via ORAL
  Filled 2016-09-25: qty 1

## 2016-09-25 MED ORDER — SODIUM CHLORIDE 0.9 % WEIGHT BASED INFUSION: 1.0000 mL/kg/h | INTRAVENOUS | Status: AC

## 2016-09-25 MED ORDER — HEPARIN (PORCINE) IN NACL 2-0.9 UNIT/ML-% IJ SOLN
INTRAMUSCULAR | Status: DC | PRN
  Administered 2016-09-25: 1500 mL via INTRA_ARTERIAL

## 2016-09-25 MED ORDER — FUROSEMIDE 10 MG/ML IJ SOLN
40.0000 mg | Freq: Once | INTRAMUSCULAR | Status: AC
  Administered 2016-09-25: 40 mg via INTRAVENOUS
  Filled 2016-09-25: qty 4

## 2016-09-25 MED ORDER — LIDOCAINE HCL (PF) 1 % IJ SOLN
INTRAMUSCULAR | Status: DC | PRN
  Administered 2016-09-25: 20 mL via SUBCUTANEOUS

## 2016-09-25 MED ORDER — MIDAZOLAM HCL 2 MG/2ML IJ SOLN
INTRAMUSCULAR | Status: DC | PRN
  Administered 2016-09-25: 1 mg via INTRAVENOUS

## 2016-09-25 MED ORDER — HEPARIN SODIUM (PORCINE) 5000 UNIT/ML IJ SOLN
5000.0000 [IU] | Freq: Three times a day (TID) | INTRAMUSCULAR | Status: DC
  Administered 2016-09-26 (×2): 5000 [IU] via SUBCUTANEOUS
  Filled 2016-09-25 (×2): qty 1

## 2016-09-25 MED ORDER — ORAL CARE MOUTH RINSE
15.0000 mL | Freq: Two times a day (BID) | OROMUCOSAL | Status: DC
  Administered 2016-09-25 – 2016-09-26 (×3): 15 mL via OROMUCOSAL

## 2016-09-25 MED ORDER — ATORVASTATIN CALCIUM 80 MG PO TABS
80.0000 mg | ORAL_TABLET | Freq: Every day | ORAL | Status: DC
  Administered 2016-09-25 – 2016-09-26 (×2): 80 mg via ORAL
  Filled 2016-09-25 (×2): qty 1

## 2016-09-25 MED ORDER — ASPIRIN 81 MG PO CHEW: 81.0000 mg | CHEWABLE_TABLET | ORAL | Status: DC

## 2016-09-25 MED ORDER — NITROGLYCERIN 0.4 MG SL SUBL
SUBLINGUAL_TABLET | SUBLINGUAL | Status: AC
  Filled 2016-09-25: qty 1

## 2016-09-25 MED ORDER — SODIUM CHLORIDE 0.9 % IV SOLN: INTRAVENOUS | Status: DC

## 2016-09-25 MED ORDER — NITROGLYCERIN 0.4 MG SL SUBL
0.4000 mg | SUBLINGUAL_TABLET | SUBLINGUAL | Status: DC | PRN
  Administered 2016-09-25 (×2): 0.4 mg via SUBLINGUAL
  Filled 2016-09-25: qty 1

## 2016-09-25 MED ORDER — SODIUM CHLORIDE 0.9% FLUSH: 3.0000 mL | Freq: Two times a day (BID) | INTRAVENOUS | Status: DC

## 2016-09-25 MED ORDER — SODIUM CHLORIDE 0.9% FLUSH
3.0000 mL | Freq: Two times a day (BID) | INTRAVENOUS | Status: DC
  Administered 2016-09-25 – 2016-09-26 (×2): 3 mL via INTRAVENOUS

## 2016-09-25 MED ORDER — SODIUM CHLORIDE 0.9% FLUSH: 3.0000 mL | INTRAVENOUS | Status: DC | PRN

## 2016-09-25 SURGICAL SUPPLY — 11 items
CATH EXPO 5F MPA-1 (CATHETERS) ×3 IMPLANT
CATH INFINITI 5 FR IM (CATHETERS) ×3 IMPLANT
CATH INFINITI 5FR MULTPACK ANG (CATHETERS) ×3 IMPLANT
KIT HEART LEFT (KITS) ×3 IMPLANT
PACK CARDIAC CATHETERIZATION (CUSTOM PROCEDURE TRAY) ×3 IMPLANT
SHEATH PINNACLE 5F 10CM (SHEATH) ×3 IMPLANT
TRANSDUCER W/STOPCOCK (MISCELLANEOUS) ×3 IMPLANT
TUBING CIL FLEX 10 FLL-RA (TUBING) ×3 IMPLANT
WIRE EMERALD 3MM-J .035X150CM (WIRE) ×3 IMPLANT
WIRE EMERALD 3MM-J .035X260CM (WIRE) ×3 IMPLANT
WIRE HI TORQ VERSACORE-J 145CM (WIRE) ×3 IMPLANT

## 2016-09-25 NOTE — Progress Notes (Addendum)
STROKE TEAM PROGRESS NOTE   SUBJECTIVE (INTERVAL HISTORY) Patient lying in the bed. No new complaints.  Daughter at the bedside.  Patient is having chest pain and is now on IV heparin drip and nitroglycerin drip  OBJECTIVE Temp:  [97.9 F (36.6 C)-99.3 F (37.4 C)] 98.4 F (36.9 C) (10/17 1200) Pulse Rate:  [65-118] 70 (10/17 1300) Cardiac Rhythm: Normal sinus rhythm (10/17 1200) Resp:  [18-31] 29 (10/17 1300) BP: (90-181)/(54-95) 102/64 (10/17 1300) SpO2:  [96 %-100 %] 96 % (10/17 1300) Weight:  [100 lb 8.5 oz (45.6 kg)] 100 lb 8.5 oz (45.6 kg) (10/17 0239)  CBC:   Recent Labs Lab 09/23/16 0415 09/25/16 0739  WBC 13.8* 11.4*  HGB 10.3* 9.4*  HCT 32.5* 29.5*  MCV 84.9 82.9  PLT 305 349    Basic Metabolic Panel:   Recent Labs Lab 09/23/16 0415 09/24/16 0338  NA 139 139  K 4.5 3.8  CL 108 108  CO2 20* 23  GLUCOSE 140* 146*  BUN 17 18  CREATININE 1.46* 1.60*  CALCIUM 8.5* 8.3*    Lipid Panel:     Component Value Date/Time   CHOL 142 09/22/2016 0500   TRIG 131 09/22/2016 0500   HDL 37 (L) 09/22/2016 0500   CHOLHDL 3.8 09/22/2016 0500   VLDL 26 09/22/2016 0500   LDLCALC 79 09/22/2016 0500   HgbA1c:  Lab Results  Component Value Date   HGBA1C 8.6 (H) 09/22/2016   Urine Drug Screen: No results found for: LABOPIA, COCAINSCRNUR, LABBENZ, AMPHETMU, THCU, LABBARB    IMAGING  Mr Brain Wo Contrast 09/22/2016 1. Nonspecific left brachium pontis lesion without diffusion restriction may represent a late subacute infarct, focus of demyelination, and mass is not excluded. Consider contrast CT or MRI for further characterization.  2. No acute intracranial abnormality identified.  3. Advanced chronic microvascular ischemic changes and parenchymal volume loss.   MRA  09/23/2016 1. Asymmetric signal in the right internal carotid artery suggesting right-sided bifurcation or more proximal stenosis and decreased perfusion. 2. Moderate stenosis of the supraclinoid left  internal carotid artery. 3. 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 4. Diffuse distal small vessel disease without a significant proximal stenosis or occlusion in either the anterior posterior circulation otherwise. 5. Atherosclerotic changes within the proximal basilar artery without a significant stenosis relative to the more distal vessel.   2D Echocardiogram  - Left ventricle: The cavity size was normal. Wall thickness was  increased in a pattern of mild LVH. Systolic function was at the lower limits of normal. The estimated ejection fraction was in the range of 50% to 55%. Features are consistent with a pseudonormal left ventricular filling pattern, with concomitant abnormal relaxation and increased filling pressure (grade 2 diastolic dysfunction). Doppler parameters are consistent with high ventricular filling pressure. - Mitral valve: Severely calcified annulus. There was moderate regurgitation. - Left atrium: The atrium was severely dilated. - Right ventricle: Systolic function was moderately reduced. - Atrial septum: No defect or patent foramen ovale was identified. - Tricuspid valve: There was moderate regurgitation. - Pulmonary arteries: PA peak pressure: 47 mm Hg (S).  CUS Findings suggest upper range 1-39% versus low range 40-59% stenosis by plaque morphology and elevated peak systolic velocity. Left internal carotid artery exhibits elevated velocities suggestive of 40-59% stenosis. The left external carotid artery exhibits significantly elevated velocities suggestive of >50% stenosis. The right vertebral artery is patent with antegrade flow. The left vertebral artery is patent with retrograde flow, and the left subclavian artery  exhibits monophasic waveforms; suggestive of possible proximal stenosis.   PHYSICAL EXAM General - Very thin elderly woman resting comfortably in bed, no acute distress.Marland Kitchen  Ophthalmologic - Fundi not visualized due to eye  movement.  Cardiovascular - Regular rate and rhythm.  Mental Status -  Level of arousal and orientation to month, place, and person were intact, but not orientated to year and age. Language including expression, naming, repetition, comprehension was assessed and found intact. Fund of Knowledge was assessed and was intact.  Cranial Nerves II - XII - II - Visual field intact OU. III, IV, VI - Extraocular movements intact. V - Facial sensation intact bilaterally. VII - Facial movement intact bilaterally. VIII - Hearing & vestibular intact bilaterally. X - Palate elevates symmetrically. XI - Chin turning & shoulder shrug intact bilaterally. XII - Tongue protrusion intact.  Motor Strength - [name] strength was 4/5 in all extremities and pronator drift was absent.  Bulk was decreased throughout and fasciculations were absent.   Motor Tone - Muscle tone was assessed at the neck and appendages and was normal.  Reflexes - [name] reflexes were 1+ in all extremities and she had no pathological reflexes.  Sensory - Light touch, temperature/pinprick were assessed and were symmetrical.    Coordination - The patient had normal movements in the hands with no ataxia or dysmetria.  Tremor was absent.  Gait and Station - deferred due to safety concerns.   ASSESSMENT/PLAN [name] is a 80 y.o. female with history of a previous stroke, hypertension, ongoing tobacco use, herpes zoster, GI bleed, diabetes mellitus, and bilateral carotid stenosis presenting with a mechanical fall, dizziness, mild headache, and transient speech difficulties.  She did not receive IV t-PA due to minimal deficits.  Possible subacute stroke at left cerebellar peduncle. However, DWI signal atypical, need to rule out tumor. Due to decreased GFR, recommend MRI brain  with contrast as outpatient once GFR improves.  Resultant - resolution of deficits.  MRI - DWI signal change at left brachium pontis  lesion, possible subacute infarct, but tumor cannot be ruled out.   Consider MRI with contrast as outpt once GFR improves  2D Echo - EF 50-55%. No source of embolus. LA 39  Carotid Doppler - .Findings suggest upper range 1-39% versus low range 40-59% stenosis by plaque morphology and elevated peak systolic velocity. Left internal carotid artery exhibits elevated velocities suggestive of 40-59% stenosis. The left external carotid artery exhibits significantly elevated velocities suggestive of >50% stenosis. The right vertebral artery is patent with antegrade flow. The left vertebral artery is patent with retrograde flow, and the left subclavian artery exhibits monophasic waveforms; suggestive of  possible proximal stenosis.  MRA head - R ICA decreased perfusion ? prox stenosis. L ICA supraclinoid mod stenosis. 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   LDL - 79  HgbA1c 8.6  VTE prophylaxis - subcutaneous heparin Diet NPO time specified  aspirin 81 mg daily prior to admission but admitted to inconsistent use, now on aspirin 325 mg daily.   Patient counseled to be compliant with her antithrombotic medications  Ongoing aggressive stroke risk factor management  Therapy recommendations: supervision, HH PT, HH OT  Disposition: home with HH  Elevated creatinine / decreased GFR   Cre 1.74->1.66->1.55->1.46  Still low GFR, radiology not recommending MRI or CT with contrast  Consider MRI with contrast as outpt once GFR improves  Will do carotid Doppler and MRA head this time for stroke workup  Recommend adequate hydration at  home  Bilateral carotid stenosis  03/2015 CUS bilateral 50-69% stenosis  CUS pending   Hypertension  Blood pressure somewhat low at times  Permissive hypertension (OK if < 220/120) but gradually normalize in 5-7 days  Long-term BP goal normotensive  Hyperlipidemia  Home meds: Lipitor 40 mg daily prior to admission.  LDL 79, goal < 70  Lipitor resumed  Continue  statin at discharge  Diabetes  HgbA1c 8.6, goal < 7.0  Uncontrolled  On Lantus  SSI  CBG showed hyperglycemia  Tobacco abuse  Current smoker  Smoking cessation counseling provided  Pt is willing to quit  Other Stroke Risk Factors  Advanced age  Hx stroke/TIA - 17 years ago with right arm weakness, no residual deficit  Family hx stroke (brother)  Other Active Problems  Normocytotic anemia 9.6 / 29.8  Mild leukocytosis - 11.9 -> 13.8 (afebrile)  Kidney disease of uncertain chronicity    Hospital day # 3  Carlesha Seiple  [facility] See Amion for Pager information 09/25/2016 1:18 PM  I have personally examined this patient, reviewed notes, independently viewed imaging studies, participated in medical decision making and plan of care.ROS completed by me personally and pertinent positives fully documented  I have made any additions or clarifications directly to the above note. Agree with note above. I had a long discussion the patient and daughter at the bedside and discuss results of MRI findings possibly suggesting subacute infarct since structural  lesion would be less likely.  Unable to obtain CT angiogram or MR angiogram with contrast due to poor renal function. There is flow in the left vertebral artery could represent symptomatic high-grade proximal subclavian stenosis which may need consideration for revascularization in the future. Patient is neurologically cleared to be on heparin drip or dual antiplatelet therapy if required from cardiac standpoint. Greater than 50% time during this 25 minute visit was spent on counseling and coordination of care about her presentation, Stroke team will sign off. Follow-up as an outpatient in stroke clinic in 6 weeks.  Delia HeadyPramod Kiernan Atkerson, MD Medical Director [facility] Pager: [phone] 09/25/2016 1:18 PM  To contact Stroke Continuity provider, please refer to [email or web address]. After hours, contact General  Neurology

## 2016-09-25 NOTE — Progress Notes (Signed)
Patient Name: Gwendolyn HeysBessie M Wallace Date of Encounter: 09/25/2016  [facility]  Hospital Problem List     Principal Problem:   TIA (transient ischemic attack) Active Problems:   Insulin dependent diabetes mellitus (HCC)   HYPERTENSION, BENIGN SYSTEMIC   CAD (coronary artery disease)   Carotid artery stenosis   Other vitamin B12 deficiency anemia   Kidney disease   Leukocytosis   Unsteadiness on feet   Uncontrolled type 2 diabetes mellitus with hyperglycemia, with long-term current use of insulin (HCC)   Stroke syndrome (HCC)   NSTEMI (non-ST elevated myocardial infarction) (HCC)     Subjective   No further chest pain no SOB   Inpatient Medications    Scheduled Meds: . aspirin EC  81 mg Oral Daily  . atorvastatin  80 mg Oral Daily  . insulin aspart  0-15 Units Subcutaneous TID WC  . insulin aspart  0-5 Units Subcutaneous QHS  . insulin aspart  4 Units Subcutaneous TID WC  . insulin glargine  18 Units Subcutaneous Daily  . mouth rinse  15 mL Mouth Rinse BID  . metoprolol tartrate  25 mg Oral Q6H  . multivitamin with minerals  1 tablet Oral Daily  . nitroGLYCERIN      . pantoprazole  20 mg Oral Daily  . protein supplement shake  4 oz Oral BID BM  . cyanocobalamin  2,000 mcg Oral Daily   Continuous Infusions: . heparin 550 Units/hr (09/25/16 1008)  . nitroGLYCERIN     PRN Meds: acetaminophen, metoCLOPramide, nitroGLYCERIN, senna-docusate   Vital Signs    Vitals:   09/25/16 0800 09/25/16 0900 09/25/16 1000 09/25/16 1100  BP: 104/65 103/87 102/65 107/73  Pulse: 73 65 72 73  Resp: (!) 30 (!) 27 (!) 27 (!) 29  Temp: 98.3 F (36.8 C)     TempSrc: Oral     SpO2: 99% 100% 100% 98%  Weight:      Height:        Intake/Output Summary (Last 24 hours) at 09/25/16 1253 Last data filed at 09/25/16 1100  Gross per 24 hour  Intake             53.6 ml  Output                1 ml  Net             52.6 ml   Filed Weights   09/23/16 0800  09/25/16 0239  Weight: 74 lb (33.6 kg) 100 lb 8.5 oz (45.6 kg)    Physical Exam   GEN: thin female in no acute distress.  HEENT: Grossly normal.  Neck: Supple, no JVD Cardiac: RRR, no murmurs, rubs, or gallops. No clubbing, cyanosis, edema.  Radials/DP/PT 2+ and equal bilaterally.  Respiratory:  Respirations regular and unlabored, clear to auscultation bilaterally. GI: Soft, nontender, nondistended, BS + x 4. MS: no deformity or atrophy. Skin: warm and dry, brisk capillary refill Neuro:   AAOx3 MAE, follows commands, + facial symmetry Psych:.  Normal affect.  Labs    CBC  Recent Labs  09/23/16 0415 09/25/16 0739  WBC 13.8* 11.4*  HGB 10.3* 9.4*  HCT 32.5* 29.5*  MCV 84.9 82.9  PLT 305 349   Basic Metabolic Panel  Recent Labs  09/23/16 0415 09/24/16 0338  NA 139 139  K 4.5 3.8  CL 108 108  CO2 20* 23  GLUCOSE 140* 146*  BUN 17 18  CREATININE 1.46* 1.60*  CALCIUM 8.5* 8.3*  Liver Function Tests No results for input(s): AST, ALT, ALKPHOS, BILITOT, PROT, ALBUMIN in the last 72 hours. No results for input(s): LIPASE, AMYLASE in the last 72 hours. Cardiac Enzymes  Recent Labs  09/24/16 1839 09/25/16 0056 09/25/16 0739  TROPONINI 1.86* 5.29* 13.08*   BNP Invalid input(s): POCBNP D-Dimer No results for input(s): DDIMER in the last 72 hours. Hemoglobin A1C No results for input(s): HGBA1C in the last 72 hours. Fasting Lipid Panel No results for input(s): CHOL, HDL, LDLCALC, TRIG, CHOLHDL, LDLDIRECT in the last 72 hours. Thyroid Function Tests  Recent Labs  09/23/16 0415  TSH 2.980    Telemetry    SR  - Personally Reviewed  ECG    Sinus rhythm with Premature supraventricular complexes ST & T wave abnormality, consider anterolateral ischemia Prolonged QT Abnormal ECG - Personally Reviewed  New 09/21/16  Radiology    Mr Maxine Glenn Head Wo Contrast  Result Date: 09/23/2016 CLINICAL DATA:  Left middle cerebellar peduncle lesion. Episode of  dizziness and headaches. EXAM: MRA HEAD WITHOUT CONTRAST TECHNIQUE: Angiographic images of the Circle of Willis were obtained using MRA technique without intravenous contrast. COMPARISON:  MRI brain 09/22/2016. FINDINGS: There is asymmetric signal in the right internal carotid artery suggesting decreased profusion in a probable more proximal lesion at the bifurcation. There is moderate narrowing of the supraclinoid left ICA. A left posterior communicating artery aneurysm measures 3 mm. The right A1 segment is not visualized. The left A1 and M1 segments demonstrate mild proximal narrowing without a significant stenosis. The anterior communicating artery is patent. MCA bifurcations are intact bilaterally. There is moderate distal attenuation of MCA branch vessels bilaterally without a focal proximal stenosis. 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 The right vertebral artery is the dominant vessel. Right PICA origin is visualized and normal. The proximal left vertebral artery is poorly seen. Irregularity is noted in the proximal basilar artery without a significant stenosis relative to the more distal vessel. Both posterior cerebral arteries originate from the basilar tip. There is moderate attenuation of distal branches. IMPRESSION: 1. Asymmetric signal in the right internal carotid artery suggesting right-sided bifurcation or more proximal stenosis and decreased perfusion. 2. Moderate stenosis of the supraclinoid left internal carotid artery. 3. 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 4. Diffuse distal small vessel disease without a significant proximal stenosis or occlusion in either the anterior posterior circulation otherwise. 5. Atherosclerotic changes within the proximal basilar artery without a significant stenosis relative to the more distal vessel. Electronically Signed   By: Marin Roberts M.D.   On: 09/23/2016 15:59    Cardiac Studies   ECHO  09/23/16 Study Conclusions  - Left ventricle: The cavity size was normal. Wall thickness was   increased in a pattern of mild LVH. Systolic function was at the   lower limits of normal. The estimated ejection fraction was in   the range of 50% to 55%. Features are consistent with a   pseudonormal left ventricular filling pattern, with concomitant   abnormal relaxation and increased filling pressure (grade 2   diastolic dysfunction). Doppler parameters are consistent with   high ventricular filling pressure. - Mitral valve: Severely calcified annulus. There was moderate   regurgitation. - Left atrium: The atrium was severely dilated. - Right ventricle: Systolic function was moderately reduced. - Atrial septum: No defect or patent foramen ovale was identified. - Tricuspid valve: There was moderate regurgitation. - Pulmonary arteries: PA peak pressure: 47 mm Hg (S).  Patient Profile     80 yo with history of CAD s/p CABG, CKD, HTN, and type II diabetes was admitted with dysarthria, dizziness, and imbalance.  This was transient and resolved.  MRI showed subacute cerebellar infarct versus mass in the left brachium pontis.  Neurology has been following.  MRA neck showed suspected significant proximal RICA stenosis and moderate LICA stenosis.  Echo showed EF 50-55%, mild LVH, moderate MR, moderately decreased RV systolic function with PASP 47 mmHg- she developed chest pain 09/24/16 with elevated troponin.  ECG tonight showed NSR with TWIs leads V3-V6 concerning for ischemia. However, she had TWIs in these leads on 10/14 as well.    Assessment & Plan    80 yo with history of CAD s/p CABG, CKD, HTN, and type II diabetes was admitted with dysarthria, dizziness, and imbalance. She had MRI showing subacute cerebellar infarct versus brain mass.  Today, she has had 2 episodes of chest pain at rest, troponin elevated.   1. Recent CVA versus brain mass: MRI showed subacute cerebellar infarct versus  mass. MRI of the brain showed nonspecific left brachium pontis lesion She is on ASA, statin.   2. NSTEMI: Elevated troponin in setting chest pain.  Chest pain has recurred tonight. ECG similar to 10/14 ECG with TWIs V3-V6.  She has history of CABG in 2000. + NSTEMI with pk troponin now 13.08 - Continue ASA 81 + atorvastatin 80 daily.  - metoprolol 25 mg every 6 hrs.  - NTG sublingual now, can use NTG gtt if needed with ongoing chest pain but will have to transfer floors.  - Dr. Shirlee Latch discussed with neurology => ok for heparin gtt without a bolus.   - Dr. Shirlee Latch thinks she will need cardiac cath eventually.  Today troponin 1.86, 5.29, and now 13.08.  discuss timing with neurology. Is NPO.   3. CKD: Stage III.  She appears to be at her baseline. Last night Cr was 1.60  4. Acute on chronic diastolic CHF: EF 16-10%, moderate MR, moderately decreased RV systolic function, PASP 47 mmHg. She is volume overloaded on exam and short of breath.  She had been getting IV fluid.  - Stopped IVF last pm  - Lasix 40 mg IV x 1 last pm. Lungs improved today  5. GI no BM since Friday, denies constipation will add stool softener and depending on cath miralax  6. DM type II with hgb A1C 8.6    Signed, Nada Boozer, FNP-C [facility]  Pgr: [phone] or after 5pm and on weekends call [phone] 09/25/2016

## 2016-09-25 NOTE — Progress Notes (Signed)
Spoke with daughter informed that she is being transferred to [id number] and that the Cardiologist has seen her and because of her chest pain she is being transferred. No questions at this time.

## 2016-09-25 NOTE — Progress Notes (Signed)
ANTICOAGULATION CONSULT NOTE  Pharmacy Consult for Heparin Indication: chest pain/ACS  Allergies  Allergen Reactions  . Gemfibrozil Other (See Comments)    MYALGIA  . Trulicity [Dulaglutide] Nausea And Vomiting    EXTREME N & V AND LETHARGY  . Tape Rash    Please use paper tape    Patient Measurements: Height: 5' (152.4 cm) Weight: 100 lb 8.5 oz (45.6 kg) IBW/kg (Calculated) : 45.5 Heparin Dosing Weight: 34  Vital Signs: Temp: 98.3 F (36.8 C) (10/17 0800) Temp Source: Oral (10/17 0800) BP: 103/87 (10/17 0900) Pulse Rate: 65 (10/17 0900)  Labs:  Recent Labs  09/22/16 1013 09/23/16 0415 09/24/16 0338 09/24/16 1839 09/25/16 0056 09/25/16 0739  HGB 9.6* 10.3*  --   --   --  9.4*  HCT 29.8* 32.5*  --   --   --  29.5*  PLT 313 305  --   --   --  349  HEPARINUNFRC  --   --   --   --   --  0.16*  CREATININE 1.55* 1.46* 1.60*  --   --   --   TROPONINI  --   --   --  1.86* 5.29* 13.08*    Estimated Creatinine Clearance: 20.8 mL/min (by C-G formula based on SCr of 1.6 mg/dL (H)).   Assessment: 80yo female with hx CAD s/p CABG and B-carotid artery stenosis.  She was admitted on 10/13 after presenting s/p fall with dysarthria, dysphasia, & dizziness.  CT neg & MRI showed a nonspecific L-brachium pontis lesion; neuro felt it was possibly a subacute stroke but a tumor needs to be ruled out. No tPA was given.    She now has increasing troponins (>5 this morning) and chest pain. She was started on heparin last evening- first level this morning was low at 0.17 units/mL. Noted weight yesterday was recorded as 33.6kg, this morning she is up to 45.6- this will affect heparin dosing weight/levels. Will watch closely.  Hgb 9.4, plts 349- no bleeding noted.  Goal of Therapy:  Heparin level 0.3-0.5 units/ml Monitor platelets by anticoagulation protocol: Yes   Plan:  Increase heparin to 550 units/hr, no bolus Check heparin level in 8hr Daily heparin level and CBC Watch for s/s  of bleeding Follow for cath plans  Jaz Mallick D. Kaylen Nghiem, PharmD, BCPS Clinical Pharmacist Pager: [phone] 09/25/2016 10:11 AM

## 2016-09-25 NOTE — H&P (View-Only) (Signed)
Patient Name: Blair HeysBessie M Kouns Date of Encounter: 09/25/2016  [facility]  [facility] Problem [facility]     Principal Problem:   TIA (transient ischemic attack) Active Problems:   Insulin dependent diabetes mellitus (HCC)   HYPERTENSION, BENIGN SYSTEMIC   CAD (coronary artery disease)   Carotid artery stenosis   Other vitamin B12 deficiency anemia   Kidney disease   Leukocytosis   Unsteadiness on feet   Uncontrolled type 2 diabetes mellitus with hyperglycemia, with long-term current use of insulin (HCC)   Stroke syndrome (HCC)   NSTEMI (non-ST elevated myocardial infarction) (HCC)     Subjective   No further chest pain no SOB   Inpatient Medications    Scheduled Meds: . aspirin EC  81 mg Oral Daily  . atorvastatin  80 mg Oral Daily  . insulin aspart  0-15 Units Subcutaneous TID WC  . insulin aspart  0-5 Units Subcutaneous QHS  . insulin aspart  4 Units Subcutaneous TID WC  . insulin glargine  18 Units Subcutaneous Daily  . mouth rinse  15 mL Mouth Rinse BID  . metoprolol tartrate  25 mg Oral Q6H  . multivitamin with minerals  1 tablet Oral Daily  . nitroGLYCERIN      . pantoprazole  20 mg Oral Daily  . protein supplement shake  4 oz Oral BID BM  . cyanocobalamin  2,000 mcg Oral Daily   Continuous Infusions: . heparin 550 Units/hr (09/25/16 1008)  . nitroGLYCERIN     PRN Meds: acetaminophen, metoCLOPramide, nitroGLYCERIN, senna-docusate   Vital Signs    Vitals:   09/25/16 0800 09/25/16 0900 09/25/16 1000 09/25/16 1100  BP: 104/65 103/87 102/65 107/73  Pulse: 73 65 72 73  Resp: (!) 30 (!) 27 (!) 27 (!) 29  Temp: 98.3 F (36.8 C)     TempSrc: Oral     SpO2: 99% 100% 100% 98%  Weight:      Height:        Intake/Output Summary (Last 24 hours) at 09/25/16 1253 Last data filed at 09/25/16 1100  Gross per 24 hour  Intake             53.6 ml  Output                1 ml  Net             52.6 ml   Filed Weights   09/23/16 0800  09/25/16 0239  Weight: 74 lb (33.6 kg) 100 lb 8.5 oz (45.6 kg)    Physical Exam   GEN: thin female in no acute distress.  HEENT: Grossly normal.  Neck: Supple, no JVD Cardiac: RRR, no murmurs, rubs, or gallops. No clubbing, cyanosis, edema.  Radials/DP/PT 2+ and equal bilaterally.  Respiratory:  Respirations regular and unlabored, clear to auscultation bilaterally. GI: Soft, nontender, nondistended, BS + x 4. MS: no deformity or atrophy. Skin: warm and dry, brisk capillary refill Neuro:   AAOx3 MAE, follows commands, + facial symmetry Psych:.  Normal affect.  Labs    CBC  Recent Labs  09/23/16 0415 09/25/16 0739  WBC 13.8* 11.4*  HGB 10.3* 9.4*  HCT 32.5* 29.5*  MCV 84.9 82.9  PLT 305 349   Basic Metabolic Panel  Recent Labs  09/23/16 0415 09/24/16 0338  NA 139 139  K 4.5 3.8  CL 108 108  CO2 20* 23  GLUCOSE 140* 146*  BUN 17 18  CREATININE 1.46* 1.60*  CALCIUM 8.5* 8.3*  Liver Function Tests No results for input(s): AST, ALT, ALKPHOS, BILITOT, PROT, ALBUMIN in the last 72 hours. No results for input(s): LIPASE, AMYLASE in the last 72 hours. Cardiac Enzymes  Recent Labs  09/24/16 1839 09/25/16 0056 09/25/16 0739  TROPONINI 1.86* 5.29* 13.08*   BNP Invalid input(s): POCBNP D-Dimer No results for input(s): DDIMER in the last 72 hours. Hemoglobin A1C No results for input(s): HGBA1C in the last 72 hours. Fasting Lipid Panel No results for input(s): CHOL, HDL, LDLCALC, TRIG, CHOLHDL, LDLDIRECT in the last 72 hours. Thyroid Function Tests  Recent Labs  09/23/16 0415  TSH 2.980    Telemetry    SR  - Personally Reviewed  ECG    Sinus rhythm with Premature supraventricular complexes ST & T wave abnormality, consider anterolateral ischemia Prolonged QT Abnormal ECG - Personally Reviewed  New 09/21/16  Radiology    Mr Maxine Glenn Head Wo Contrast  Result Date: 09/23/2016 CLINICAL DATA:  Left middle cerebellar peduncle lesion. Episode of  dizziness and headaches. EXAM: MRA HEAD WITHOUT CONTRAST TECHNIQUE: Angiographic images of the Circle of Willis were obtained using MRA technique without intravenous contrast. COMPARISON:  MRI brain 09/22/2016. FINDINGS: There is asymmetric signal in the right internal carotid artery suggesting decreased profusion in a probable more proximal lesion at the bifurcation. There is moderate narrowing of the supraclinoid left ICA. A left posterior communicating artery aneurysm measures 3 mm. The right A1 segment is not visualized. The left A1 and M1 segments demonstrate mild proximal narrowing without a significant stenosis. The anterior communicating artery is patent. MCA bifurcations are intact bilaterally. There is moderate distal attenuation of MCA branch vessels bilaterally without a focal proximal stenosis. 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 The right vertebral artery is the dominant vessel. Right PICA origin is visualized and normal. The proximal left vertebral artery is poorly seen. Irregularity is noted in the proximal basilar artery without a significant stenosis relative to the more distal vessel. Both posterior cerebral arteries originate from the basilar tip. There is moderate attenuation of distal branches. IMPRESSION: 1. Asymmetric signal in the right internal carotid artery suggesting right-sided bifurcation or more proximal stenosis and decreased perfusion. 2. Moderate stenosis of the supraclinoid left internal carotid artery. 3. 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 4. Diffuse distal small vessel disease without a significant proximal stenosis or occlusion in either the anterior posterior circulation otherwise. 5. Atherosclerotic changes within the proximal basilar artery without a significant stenosis relative to the more distal vessel. Electronically Signed   By: Marin Roberts M.D.   On: 09/23/2016 15:59    Cardiac Studies   ECHO  09/23/16 Study Conclusions  - Left ventricle: The cavity size was normal. Wall thickness was   increased in a pattern of mild LVH. Systolic function was at the   lower limits of normal. The estimated ejection fraction was in   the range of 50% to 55%. Features are consistent with a   pseudonormal left ventricular filling pattern, with concomitant   abnormal relaxation and increased filling pressure (grade 2   diastolic dysfunction). Doppler parameters are consistent with   high ventricular filling pressure. - Mitral valve: Severely calcified annulus. There was moderate   regurgitation. - Left atrium: The atrium was severely dilated. - Right ventricle: Systolic function was moderately reduced. - Atrial septum: No defect or patent foramen ovale was identified. - Tricuspid valve: There was moderate regurgitation. - Pulmonary arteries: PA peak pressure: 47 mm Hg (S).  Patient Profile     80 yo with history of CAD s/p CABG, CKD, HTN, and type II diabetes was admitted with dysarthria, dizziness, and imbalance.  This was transient and resolved.  MRI showed subacute cerebellar infarct versus mass in the left brachium pontis.  Neurology has been following.  MRA neck showed suspected significant proximal RICA stenosis and moderate LICA stenosis.  Echo showed EF 50-55%, mild LVH, moderate MR, moderately decreased RV systolic function with PASP 47 mmHg- she developed chest pain 09/24/16 with elevated troponin.  ECG tonight showed NSR with TWIs leads V3-V6 concerning for ischemia. However, she had TWIs in these leads on 10/14 as well.    Assessment & Plan    80 yo with history of CAD s/p CABG, CKD, HTN, and type II diabetes was admitted with dysarthria, dizziness, and imbalance. She had MRI showing subacute cerebellar infarct versus brain mass.  Today, she has had 2 episodes of chest pain at rest, troponin elevated.   1. Recent CVA versus brain mass: MRI showed subacute cerebellar infarct versus  mass. MRI of the brain showed nonspecific left brachium pontis lesion She is on ASA, statin.   2. NSTEMI: Elevated troponin in setting chest pain.  Chest pain has recurred tonight. ECG similar to 10/14 ECG with TWIs V3-V6.  She has history of CABG in 2000. + NSTEMI with pk troponin now 13.08 - Continue ASA 81 + atorvastatin 80 daily.  - metoprolol 25 mg every 6 hrs.  - NTG sublingual now, can use NTG gtt if needed with ongoing chest pain but will have to transfer floors.  - Dr. Shirlee Latch discussed with neurology => ok for heparin gtt without a bolus.   - Dr. Shirlee Latch thinks she will need cardiac cath eventually.  Today troponin 1.86, 5.29, and now 13.08.  discuss timing with neurology. Is NPO.   3. CKD: Stage III.  She appears to be at her baseline. Last night Cr was 1.60  4. Acute on chronic diastolic CHF: EF 16-10%, moderate MR, moderately decreased RV systolic function, PASP 47 mmHg. She is volume overloaded on exam and short of breath.  She had been getting IV fluid.  - Stopped IVF last pm  - Lasix 40 mg IV x 1 last pm. Lungs improved today  5. GI no BM since Friday, denies constipation will add stool softener and depending on cath miralax  6. DM type II with hgb A1C 8.6    Signed, Nada Boozer, FNP-C [facility]  Pgr: [phone] or after 5pm and on weekends call [phone] 09/25/2016

## 2016-09-25 NOTE — Progress Notes (Signed)
PROGRESS NOTE  Gwendolyn HeysBessie M Wallace ZOX:[id number] DOB: 09-18-1936 DOA: 09/21/2016 PCP: Dwana MelenaZack Hall, MD Brief History: 80 y.o.femalewith medical history significant forhypertension, insulin-dependent diabetes mellitus, coronary artery disease status post CABG, and bilateral carotid artery stenosis who presents to the emergency department after a fall and reports transient dysarthria, dysphasiaand dizziness on the evening of 09/21/16. [name] daughter noted that the patient's speech was "babbling", and the patient had word finding difficulties at home. The episode lasted approximately 5 minutes. In addition, the patient also reports also for balance resulting in a fall which resulted in lefthip pain. There was no loss of consciousness. Since her mechanical fall, the patient has reported some weakness in her left leg. CT of the brain was unremarkable. MRI of the brain showed nonspecific left brachium pontis lesion. The patient was admitted for further neurologic workup. Neurology was consulted to assist with management.  On 09/24/16 late afternoon, pt developed CP.  Troponins and EKG suggested ACS/NSTEMI-->cardiology consulted and pt was placed on IV heparin.  Assessment/Plan: Dysathria/Dysphasia -likely subacute stroke cerebellumvs brain mass -09/22/16--case discussed with Dr. Shela CommonsJ Xu-->MRI brain WITH gad if renal function improves with IVF -Appreciate Neurology Consult -PT/OT evaluation -CT brain--neg -MRI brain--nonspecific lesion left brachium pontis -MRA brain--asymmetric signal in the right internal carotid artery suggesting decreased profusion in a probable more proximal lesion at the bifurcation. There is moderate narrowing of the supraclinoid left ICA -Carotid Duplex--see below -Echo--EF 50-55%, grade 2 DD, mod MR/TR -LDL--79--> lipitor 40 mg daily -HbA1C--8.6 -Antiplatelet--ASA 325 mg  NSTEMI/CAD -developed CP 10/16 afternoon -cycled troponin  1.86-->5.29-->13.08 -EKG--new TWI V3-V6 -Appreciate cardiology consult--> started on IV heparin - Status-post CABG in 2000  -increase lipitor to 80 mg daily -timing of cardiac cath per cardiology -lasix 40 mg IV ordered by cards 10/17 for fluid overload  Left subclavian stenosis -09/24/16-- carotid US--left subclavian artery exhibits monophasic waveforms; suggestive of possible proximal stenosis. -this explains discordant LEFT and RIGHT arm BPs-->low BPs on LEFT arm -Left arm BPs falsely low  Carotid artery stenosis  - Ultrasound from April 2016 features bilateral stenosis of 50-70%  - Per report of pt's daughter, she was to follow-up with vascular surgery after the ultrasound but never did  - 09/24/16--CUS--Left ICA--40-59%; R-ICA--1-39% versus low range 40-59% stenosis by plaque morphology and elevated peak systolic velocity  Insulin-dependent DM - HbA1c--8.6 - Managed at home with metformin 1500 mg qD and Tresiba 10 units qD; these will be held while in hospital  - Increase lantus to 18 units -add novolog 4 units with meals  Hypertension  - Managed with Norvasc, lisinopril, HCTZ, and metoprolol at home;  -held on admission given concern for stroke - Resume metoprolol in setting of NSTEMI -hydralazine prn SBP >210  Kidney disease of uncertain chronicity  - Baseline SCr 1.5-1.7? - SCr was 0.76 in 2013, the most recent prior value available  - hold lisinopril -09/23/16--continue IVF--give another liter  Normocytic anemia  - Hgb 11.3 on admission - Baseline Hgb 12-13 -drop in Hgb due to dilution - Attributed to B12-deficiency, continue supplementation  Leukocytosis - WBC 12,600 on admission without fever or apparent focus of infection  - Appears to be chronic going back years  - Culture if febrile- remains hemodynamically stable - ?low grade CLL vs stress demargination -overall stable   Disposition Plan: Not stable for dc Family Communication: Daughters  updatedat bedside 10/17.Total time spent 35 minutes.  Greater than 50% spent face to face counseling and  coordinating care.   Consultants: Neurology, cardiology--Total time spent 35 minutes.  Greater than 50% spent face to face counseling and coordinating care.   Code Status: FULL   DVT Prophylaxis: IV Heparin    Procedures: As Listed in Progress Note Above  Antibiotics: None     Subjective: Patient denies fevers, chills, headache, chest pain, dyspnea, nausea, vomiting, diarrhea, abdominal pain, dysuria, hematuria, hematochezia, and melena.   Objective: Vitals:   09/25/16 0800 09/25/16 0900 09/25/16 1000 09/25/16 1100  BP: 104/65 103/87 102/65 107/73  Pulse: 73 65 72 73  Resp: (!) 30 (!) 27 (!) 27 (!) 29  Temp: 98.3 F (36.8 C)     TempSrc: Oral     SpO2: 99% 100% 100% 98%  Weight:      Height:        Intake/Output Summary (Last 24 hours) at 09/25/16 1240 Last data filed at 09/25/16 1100  Gross per 24 hour  Intake             53.6 ml  Output                1 ml  Net             52.6 ml   Weight change: 12 kg (26 lb 8.5 oz) Exam:   General:  Pt is alert, follows commands appropriately, not in acute distress  HEENT: No icterus, No thrush, No neck mass, /AT  Cardiovascular: RRR, S1/S2, no rubs, no gallops  Respiratory: fine bibasilar crackles, no wheeze  Abdomen: Soft/+BS, non tender, non distended, no guarding  Extremities: No edema, No lymphangitis, No petechiae, No rashes, no synovitis   Data Reviewed: I have personally reviewed following labs and imaging studies Basic Metabolic Panel:  Recent Labs Lab 09/21/16 1809 09/22/16 1013 09/23/16 0415 09/24/16 0338  NA 135 136 139 139  K 4.6 3.6 4.5 3.8  CL 99* 104 108 108  CO2 25 23 20* 23  GLUCOSE 349* 207* 140* 146*  BUN 19 17 17 18   CREATININE 1.66* 1.55* 1.46* 1.60*  CALCIUM 9.0 8.6* 8.5* 8.3*   Liver Function Tests: No results for input(s): AST, ALT, ALKPHOS, BILITOT, PROT,  ALBUMIN in the last 168 hours. No results for input(s): LIPASE, AMYLASE in the last 168 hours. No results for input(s): AMMONIA in the last 168 hours. Coagulation Profile:  Recent Labs Lab 09/22/16 0310  INR 1.05   CBC:  Recent Labs Lab 09/21/16 1809 09/22/16 1013 09/23/16 0415 09/25/16 0739  WBC 12.6* 11.9* 13.8* 11.4*  HGB 11.3* 9.6* 10.3* 9.4*  HCT 35.1* 29.8* 32.5* 29.5*  MCV 82.8 82.3 84.9 82.9  PLT 395 313 305 349   Cardiac Enzymes:  Recent Labs Lab 09/24/16 1839 09/25/16 0056 09/25/16 0739  TROPONINI 1.86* 5.29* 13.08*   BNP: Invalid input(s): POCBNP CBG:  Recent Labs Lab 09/24/16 0647 09/24/16 1059 09/24/16 1630 09/24/16 2139 09/25/16 0812  GLUCAP 164* 332* 263* 98 250*   HbA1C: No results for input(s): HGBA1C in the last 72 hours. Urine analysis:    Component Value Date/Time   COLORURINE YELLOW 09/21/2016 1815   APPEARANCEUR CLEAR 09/21/2016 1815   LABSPEC 1.009 09/21/2016 1815   PHURINE 6.5 09/21/2016 1815   GLUCOSEU 250 (A) 09/21/2016 1815   HGBUR SMALL (A) 09/21/2016 1815   BILIRUBINUR NEGATIVE 09/21/2016 1815   KETONESUR NEGATIVE 09/21/2016 1815   PROTEINUR >300 (A) 09/21/2016 1815   NITRITE NEGATIVE 09/21/2016 1815   LEUKOCYTESUR NEGATIVE 09/21/2016 1815   Sepsis Labs: @LABRCNTIP (procalcitonin:4,lacticidven:4) )  Recent Results (from the past 240 hour(s))  MRSA PCR Screening     Status: None   Collection Time: 09/25/16  2:39 AM  Result Value Ref Range Status   MRSA by PCR NEGATIVE NEGATIVE Final    Comment:        The GeneXpert MRSA Assay (FDA approved for NASAL specimens only), is one component of a comprehensive MRSA colonization surveillance program. It is not intended to diagnose MRSA infection nor to guide or monitor treatment for MRSA infections.      Scheduled Meds: . aspirin EC  81 mg Oral Daily  . atorvastatin  80 mg Oral Daily  . insulin aspart  0-15 Units Subcutaneous TID WC  . insulin aspart  0-5 Units  Subcutaneous QHS  . insulin aspart  4 Units Subcutaneous TID WC  . insulin glargine  18 Units Subcutaneous Daily  . mouth rinse  15 mL Mouth Rinse BID  . metoprolol tartrate  25 mg Oral Q6H  . multivitamin with minerals  1 tablet Oral Daily  . nitroGLYCERIN      . pantoprazole  20 mg Oral Daily  . protein supplement shake  4 oz Oral BID BM  . cyanocobalamin  2,000 mcg Oral Daily   Continuous Infusions: . heparin 550 Units/hr (09/25/16 1008)  . nitroGLYCERIN      Procedures/Studies: Dg Chest 2 View  Result Date: 09/22/2016 CLINICAL DATA:  80 y/o F; status post fall 3 hours ago with dizziness falling onto right hip. EXAM: CHEST  2 VIEW COMPARISON:  07/13/2016 chest radiograph FINDINGS: Stable cardiac silhouette within normal limits. Status post CABG with sternotomy wires aligned and intact. No focal consolidation. No pneumothorax or pleural effusion. No acute osseous abnormality is identified. IMPRESSION: No active cardiopulmonary disease. Electronically Signed   By: Mitzi Hansen M.D.   On: 09/22/2016 04:50   Mr Maxine Glenn Head Wo Contrast  Result Date: 09/23/2016 CLINICAL DATA:  Left middle cerebellar peduncle lesion. Episode of dizziness and headaches. EXAM: MRA HEAD WITHOUT CONTRAST TECHNIQUE: Angiographic images of the Circle of Willis were obtained using MRA technique without intravenous contrast. COMPARISON:  MRI brain 09/22/2016. FINDINGS: There is asymmetric signal in the right internal carotid artery suggesting decreased profusion in a probable more proximal lesion at the bifurcation. There is moderate narrowing of the supraclinoid left ICA. A left posterior communicating artery aneurysm measures 3 mm. The right A1 segment is not visualized. The left A1 and M1 segments demonstrate mild proximal narrowing without a significant stenosis. The anterior communicating artery is patent. MCA bifurcations are intact bilaterally. There is moderate distal attenuation of MCA branch vessels  bilaterally without a focal proximal stenosis. 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 The right vertebral artery is the dominant vessel. Right PICA origin is visualized and normal. The proximal left vertebral artery is poorly seen. Irregularity is noted in the proximal basilar artery without a significant stenosis relative to the more distal vessel. Both posterior cerebral arteries originate from the basilar tip. There is moderate attenuation of distal branches. IMPRESSION: 1. Asymmetric signal in the right internal carotid artery suggesting right-sided bifurcation or more proximal stenosis and decreased perfusion. 2. Moderate stenosis of the supraclinoid left internal carotid artery. 3. 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 4. Diffuse distal small vessel disease without a significant proximal stenosis or occlusion in either the anterior posterior circulation otherwise. 5. Atherosclerotic changes within the proximal basilar artery without a significant stenosis relative to the more distal vessel.  Electronically Signed   By: Marin Roberts M.D.   On: 09/23/2016 15:59   Mr Brain Wo Contrast  Result Date: 09/22/2016 CLINICAL DATA:  80 y/o F; episode of dizziness and headaches accompanied by dysarthria lasting 5 minutes. EXAM: MRI HEAD WITHOUT CONTRAST TECHNIQUE: Multiplanar, multiecho pulse sequences of the brain and surrounding structures were obtained without intravenous contrast. COMPARISON:  None. FINDINGS: Brain: No acute infarction, hemorrhage, hydrocephalus, extra-axial collection or mass lesion.Moderate chronic microvascular ischemic changes and parenchymal volume loss of the brain. 13 mm diffusion hyperintense, T2 hyperintense, T1 hypointense focus within the left brachium pontis without low ADC (series 5, image 14). Advanced chronic microvascular ischemic changes and parenchymal volume loss of the brain. Vascular: Normal flow voids. Skull and  upper cervical spine: Normal marrow signal. Sinuses/Orbits: Complete opacification of left maxillary sinus, left anterior ethmoid air cells, and left frontal sinus, the left middle meatus obstructive pattern. Moderate mucosal thickening of the right maxillary sinus. Partial opacification of left mastoid tip. Orbits are grossly unremarkable. Other: None. IMPRESSION: Motion degradation of several sequences. 1. Nonspecific left brachium pontis lesion without diffusion restriction may represent a late subacute infarct, focus of demyelination, and mass is not excluded. Consider contrast CT or MRI for further characterization. 2. No acute intracranial abnormality identified. 3. Advanced chronic microvascular ischemic changes and parenchymal volume loss. Electronically Signed   By: Mitzi Hansen M.D.   On: 09/22/2016 02:46   Dg Hip Unilat W Or Wo Pelvis 2-3 Views Left  Result Date: 09/21/2016 CLINICAL DATA:  Status post fall on left side, with left hip pain. Initial encounter. EXAM: DG HIP (WITH OR WITHOUT PELVIS) 2-3V LEFT COMPARISON:  None. FINDINGS: There is no evidence of fracture or dislocation. The left femoral head remains seated at the acetabulum. The visualized portions of the left sacroiliac joint are grossly unremarkable. Diffuse vascular calcifications are seen. The visualized bowel gas pattern is unremarkable. IMPRESSION: No evidence of fracture or dislocation. Diffuse vascular calcifications seen. Electronically Signed   By: Roanna Raider M.D.   On: 09/21/2016 18:55   Dg Hip Unilat W Or Wo Pelvis 2-3 Views Right  Result Date: 09/21/2016 CLINICAL DATA:  Initial evaluation for acute trauma, fall. EXAM: DG HIP (WITH OR WITHOUT PELVIS) 2-3V RIGHT COMPARISON:  None available. FINDINGS: No acute fracture or dislocation. Femoral heads in normal alignment with the acetabula. Femoral head heights preserved. Bony pelvis intact. SI joints approximated. No pubic diastasis. Moderate degenerative  osteoarthritic changes about the hips bilaterally. Prominent degenerative changes noted within the lower lumbar spine. Diffuse osteopenia. Prominent vascular calcifications noted. Surgical clips overlie the right inguinal region. IMPRESSION: 1. No acute fracture or dislocation about the right hip. 2. Osteopenia. 3. Atherosclerosis. Electronically Signed   By: Rise Mu M.D.   On: 09/21/2016 18:52    Robina Hamor, DO  [facility] Pager [phone]  If 7PM-7AM, please contact night-coverage [email or web address] Password [email or web address] 09/25/2016, 12:40 PM   LOS: 3 days

## 2016-09-25 NOTE — Progress Notes (Signed)
Site area: Medical illustratort fem art Site Prior to Removal:  Level 0 Pressure Applied For: 20 min Manual:   Yes Patient Status During Pull:  A/O Post Pull Site:  Level 0 Post Pull Instructions Given:  Pt given post instructions. She understands. Post Pull Pulses Present: 2+ rt dp Dressing Applied:  Tegaderm and a 4x4 Bedrest begins @ 18:32:00 Comments: Pt lefaves cath lab holding area in stable condition. Rt groin unremarkable. Dressing is CDI. Derinda Carter-RCIS pulled sheath.

## 2016-09-25 NOTE — Interval H&P Note (Signed)
History and Physical Interval Note:  09/25/2016 4:58 PM  Gwendolyn Wallace  has presented today for surgery, with the diagnosis of ns  The various methods of treatment have been discussed with the patient and family. After consideration of risks, benefits and other options for treatment, the patient has consented to  Procedure(s): Left Heart Cath and Coronary Angiography (N/A) as a surgical intervention .  [name] history has been reviewed, patient examined, no change in status, stable for surgery.  I have reviewed the patient's chart and labs.  Questions were answered to the patient's satisfaction.   Cath Lab Visit (complete for each Cath Lab visit)  Clinical Evaluation Leading to the Procedure:   ACS: Yes.    Non-ACS:    Anginal Classification: CCS IV  Anti-ischemic medical therapy: Maximal Therapy (2 or more classes of medications)  Non-Invasive Test Results: No non-invasive testing performed  Prior CABG: Previous CABG       Theron Aristaeter [facility] LLCJordanMD,FACC 09/25/2016 4:58 PM

## 2016-09-25 NOTE — Consult Note (Signed)
CARDIOLOGY CONSULT NOTE  Patient ID: ROXENE ALVIAR MRN: [id number] DOB/AGE: [birth year] 80 y.o.  Admit date: 09/21/2016 Primary Cardiologist: Dr Elsie Lincoln in the distant past Reason for Consultation: Chest pain, NSTEMI  HPI: 80 yo with history of CAD s/p CABG, CKD, HTN, and type II diabetes was admitted with dysarthria, dizziness, and imbalance.  This was transient and resolved.  MRI showed subacute cerebellar infarct versus mass in the left brachium pontis.  Neurology has been following.  MRA neck showed suspected significant proximal RICA stenosis and moderate LICA stenosis.  Echo showed EF 50-55%, mild LVH, moderate MR, moderately decreased RV systolic function with PASP 47 mmHg.   Today, she reported left-sided chest tightness that lasted for 10-15 minutes early this afternoon.  Troponin was drawn later on, and can back elevated at 1.86.  She re-developed left-sided chest tightness about an hour ago.  The chest tightness is still present.  She also says that she is short of breath.  ECG tonight showed NSR with TWIs leads V3-V6 concerning for ischemia. However, she had TWIs in these leads on 10/14 as well.   Review of systems complete and found to be negative unless listed above in HPI  Past Medical History: 1. CAD: s/p CABG 2000.  2. HTN 3. Type II diabetes 4. CVA: This admission.  5. CKD: stage III.   Family History  Problem Relation Age of Onset  . Stroke Brother   . Diabetes Mother   . Diabetes Son   . Diabetes Brother   . Heart attack Brother   . Cholecystitis Daughter   . Cholecystitis Daughter   . Heart attack Father     Social History   Social History  . Marital status: Widowed    Spouse name: N/A  . Number of children: N/A  . Years of education: N/A   Occupational History  . retired    Social History Main Topics  . Smoking status: Current Every Day Smoker    Packs/day: 0.25    Types: Cigarettes  . Smokeless tobacco: Never Used  . Alcohol use No  .  Drug use: No  . Sexual activity: No   Other Topics Concern  . Not on file   Social History Narrative   Second husband, Morris, died   Daughter and her children live with her     Prescriptions Prior to Admission  Medication Sig Dispense Refill Last Dose  . aspirin ([employer] ASPIRIN) 81 MG chewable tablet Chew 81 mg by mouth at bedtime.    09/20/2016 at pm  . atorvastatin (LIPITOR) 40 MG tablet Take 1 tablet (40 mg total) by mouth daily. (Patient taking differently: Take 40 mg by mouth every morning. ) 90 tablet 3 09/20/2016 at pm  . calcium carbonate (TUMS E-X 750) 750 MG chewable tablet Chew 1 tablet by mouth 2 (two) times daily as needed for heartburn.    09/20/2016  . cyanocobalamin 2000 MCG tablet Take 2,000 mcg by mouth daily.     09/20/2016 at am  . insulin degludec (TRESIBA FLEXTOUCH) 100 UNIT/ML SOPN FlexTouch Pen Inject 10 Units into the skin daily.   09/20/2016  . lisinopril-hydrochlorothiazide (PRINZIDE,ZESTORETIC) 20-12.5 MG per tablet TAKE ONE TABLET TWICE DAILY 31 tablet 3 09/20/2016 at pm  . metFORMIN (GLUCOPHAGE) 1000 MG tablet TAKE ONE AND ONE-HALF TABLET EVERY MORNING AND ONE TABLET AT SUPPER 75 tablet 2 09/20/2016  . metoprolol (LOPRESSOR) 50 MG tablet TAKE ONE TABLET TWICE DAILY (Patient taking differently: TAKE ONE TABLET (50  mg) TWICE DAILY) 60 tablet 11 09/20/2016  . Multiple Vitamin (MULTIVITAMIN) capsule Take 1 capsule by mouth daily as needed (for supplementation).    09/20/2016  . nitroGLYCERIN (NITROSTAT) 0.4 MG SL tablet Place 0.4 mg under the tongue every 5 (five) minutes as needed for chest pain.    6 or 7 years  . pantoprazole (PROTONIX) 20 MG tablet Take 1 tablet (20 mg total) by mouth daily. (Patient taking differently: Take 20 mg by mouth daily as needed for heartburn or indigestion. ) 15 tablet 0 Past Week at Unknown time   Current Scheduled Meds: . aspirin  300 mg Rectal Daily   Or  . aspirin  325 mg Oral Daily  . atorvastatin  40 mg Oral Daily    . furosemide  40 mg Intravenous Once  . insulin aspart  0-15 Units Subcutaneous TID WC  . insulin aspart  0-5 Units Subcutaneous QHS  . insulin aspart  4 Units Subcutaneous TID WC  . insulin glargine  18 Units Subcutaneous Daily  . metoprolol tartrate  25 mg Oral Q6H  . multivitamin with minerals  1 tablet Oral Daily  . nitroGLYCERIN      . pantoprazole  20 mg Oral Daily  . potassium chloride  20 mEq Oral Once  . protein supplement shake  4 oz Oral BID BM  . cyanocobalamin  2,000 mcg Oral Daily   Continuous Infusions: . heparin 450 Units/hr (09/24/16 2317)   PRN Meds:.acetaminophen, metoCLOPramide, nitroGLYCERIN, senna-docusate   Physical exam Blood pressure (!) 175/95, pulse (!) 118, temperature 99.3 F (37.4 C), temperature source Oral, resp. rate 19, height 5\' 3"  (1.6 m), weight 74 lb (33.6 kg), SpO2 99 %. General: NAD Neck: JVP 10 cm, no thyromegaly or thyroid nodule.  Lungs: Crackles at bases bilaterally.  CV: Nondisplaced PMI.  Heart regular S1/S2, no S3/S4, 1/6 HSM apex.  1+ ankle edema. Soft left carotid bruit.  Difficult to palpate pedal pulses.  Abdomen: Soft, nontender, no hepatosplenomegaly, no distention.  Skin: Intact without lesions or rashes.  Neurologic: Alert and oriented x 3.  Psych: Normal affect. Extremities: No clubbing or cyanosis.  HEENT: Normal.   Labs:   Lab Results  Component Value Date   WBC 13.8 (H) 09/23/2016   HGB 10.3 (L) 09/23/2016   HCT 32.5 (L) 09/23/2016   MCV 84.9 09/23/2016   PLT 305 09/23/2016    Recent Labs Lab 09/24/16 0338  NA 139  K 3.8  CL 108  CO2 23  BUN 18  CREATININE 1.60*  CALCIUM 8.3*  GLUCOSE 146*   Lab Results  Component Value Date   TROPONINI 1.86 (HH) 09/24/2016     EKG: 10/14 showed NSR, LVH with repolarization abnormality.  10/17 showed NSR, TWIs V3-V6 => not markedly different than prior.   ASSESSMENT AND PLAN: 80 yo with history of CAD s/p CABG, CKD, HTN, and type II diabetes was admitted with  dysarthria, dizziness, and imbalance. She had MRI showing subacute cerebellar infarct versus brain mass.  Today, she has had 2 episodes of chest pain at rest, troponin elevated.  1. CVA versus brain mass: MRI showed subacute cerebellar infarct versus mass.  She is on ASA, statin.  2. NSTEMI: Elevated troponin in setting chest pain.  Chest pain has recurred tonight. ECG similar to 10/14 ECG with TWIs V3-V6.  She has history of CABG in 2000. I am concerned that this represents true ACS.  - Continue ASA 81 + atorvastatin 80 daily.  - metoprolol 25 mg  every 6 hrs.  - NTG sublingual now, can use NTG gtt if needed with ongoing chest pain but will have to transfer floors.  - Discussed with neurology => ok for heparin gtt without a bolus.   - I think she will need cardiac cath eventually.  Will reassess in am and will need to discuss timing with neurology. Will keep NPO.  3. CKD: Stage III.  She appears to be at her baseline.  4. Acute on chronic diastolic CHF: EF 96-04%, moderate MR, moderately decreased RV systolic function, PASP 47 mmHg. She is volume overloaded on exam and short of breath.  She has been getting IV fluid.  - Stop IVF.  - Lasix 40 mg IV x 1.   Signed: Marca Ancona 09/25/2016

## 2016-09-26 ENCOUNTER — Telehealth: Payer: Self-pay | Admitting: Cardiology

## 2016-09-26 ENCOUNTER — Encounter (HOSPITAL_COMMUNITY): Payer: Self-pay | Admitting: Cardiology

## 2016-09-26 DIAGNOSIS — D72820 Lymphocytosis (symptomatic): Secondary | ICD-10-CM

## 2016-09-26 DIAGNOSIS — Z794 Long term (current) use of insulin: Secondary | ICD-10-CM

## 2016-09-26 DIAGNOSIS — E1165 Type 2 diabetes mellitus with hyperglycemia: Secondary | ICD-10-CM

## 2016-09-26 DIAGNOSIS — G458 Other transient cerebral ischemic attacks and related syndromes: Secondary | ICD-10-CM

## 2016-09-26 DIAGNOSIS — N289 Disorder of kidney and ureter, unspecified: Secondary | ICD-10-CM

## 2016-09-26 LAB — GLUCOSE, CAPILLARY
GLUCOSE-CAPILLARY: 67 mg/dL (ref 65–99)
Glucose-Capillary: 117 mg/dL — ABNORMAL HIGH (ref 65–99)
Glucose-Capillary: 148 mg/dL — ABNORMAL HIGH (ref 65–99)
Glucose-Capillary: 216 mg/dL — ABNORMAL HIGH (ref 65–99)

## 2016-09-26 LAB — BASIC METABOLIC PANEL
Anion gap: 8 (ref 5–15)
BUN: 22 mg/dL — AB (ref 6–20)
CALCIUM: 8.4 mg/dL — AB (ref 8.9–10.3)
CHLORIDE: 106 mmol/L (ref 101–111)
CO2: 20 mmol/L — AB (ref 22–32)
CREATININE: 1.91 mg/dL — AB (ref 0.44–1.00)
GFR calc non Af Amer: 24 mL/min — ABNORMAL LOW (ref >60)
GFR, EST AFRICAN AMERICAN: 28 mL/min — AB (ref >60)
Glucose, Bld: 150 mg/dL — ABNORMAL HIGH (ref 65–99)
Potassium: 4.6 mmol/L (ref 3.5–5.1)
SODIUM: 134 mmol/L — AB (ref 135–145)

## 2016-09-26 LAB — CBC
HCT: 27.9 % — ABNORMAL LOW (ref 36.0–46.0)
HEMOGLOBIN: 8.8 g/dL — AB (ref 12.0–15.0)
MCH: 26 pg (ref 26.0–34.0)
MCHC: 31.5 g/dL (ref 30.0–36.0)
MCV: 82.5 fL (ref 78.0–100.0)
Platelets: 292 10*3/uL (ref 150–400)
RBC: 3.38 MIL/uL — AB (ref 3.87–5.11)
RDW: 15 % (ref 11.5–15.5)
WBC: 10.1 10*3/uL (ref 4.0–10.5)

## 2016-09-26 MED ORDER — ATORVASTATIN CALCIUM 80 MG PO TABS: 81.0000 mg | ORAL_TABLET | Freq: Every day | ORAL | 6 refills | Status: AC

## 2016-09-26 MED ORDER — SENNOSIDES-DOCUSATE SODIUM 8.6-50 MG PO TABS
1.0000 | ORAL_TABLET | Freq: Every day | ORAL | Status: DC | PRN
  Administered 2016-09-26: 1 via ORAL
  Filled 2016-09-26: qty 1

## 2016-09-26 MED ORDER — ISOSORBIDE MONONITRATE ER 30 MG PO TB24: 30.0000 mg | ORAL_TABLET | Freq: Every day | ORAL | 6 refills | Status: AC

## 2016-09-26 MED ORDER — METOPROLOL TARTRATE 50 MG PO TABS: 50.0000 mg | ORAL_TABLET | Freq: Two times a day (BID) | ORAL | Status: DC

## 2016-09-26 MED ORDER — LISINOPRIL 10 MG PO TABS
10.0000 mg | ORAL_TABLET | Freq: Every day | ORAL | Status: DC
  Administered 2016-09-26: 10 mg via ORAL
  Filled 2016-09-26: qty 1

## 2016-09-26 MED ORDER — ASPIRIN EC 325 MG PO TBEC: 325.0000 mg | DELAYED_RELEASE_TABLET | Freq: Every day | ORAL | 6 refills | Status: AC

## 2016-09-26 MED ORDER — ISOSORBIDE MONONITRATE ER 30 MG PO TB24
30.0000 mg | ORAL_TABLET | Freq: Every day | ORAL | Status: DC
  Administered 2016-09-26: 30 mg via ORAL
  Filled 2016-09-26: qty 1

## 2016-09-26 NOTE — Progress Notes (Signed)
Patient Name: Gwendolyn HeysBessie M Wallace Date of Encounter: 09/26/2016  Primary Cardiologist: new [facility]  Hospital Problem List     Principal Problem:   TIA (transient ischemic attack) Active Problems:   Insulin dependent diabetes mellitus (HCC)   HYPERTENSION, BENIGN SYSTEMIC   CAD (coronary artery disease)   Carotid artery stenosis   Other vitamin B12 deficiency anemia   Kidney disease   Leukocytosis   Unsteadiness on feet   Uncontrolled type 2 diabetes mellitus with hyperglycemia, with long-term current use of insulin (HCC)   Stroke syndrome (HCC)   NSTEMI (non-ST elevated myocardial infarction) (HCC)   Hypertensive heart disease without heart failure     Subjective   Feeling well.  Denies chest pain or shortness of breath.  Has not been out of bed.   Inpatient Medications    Scheduled Meds: . aspirin EC  81 mg Oral Daily  . atorvastatin  80 mg Oral Daily  . heparin  5,000 Units Subcutaneous Q8H  . insulin aspart  0-15 Units Subcutaneous TID WC  . insulin aspart  0-5 Units Subcutaneous QHS  . insulin aspart  4 Units Subcutaneous TID WC  . mouth rinse  15 mL Mouth Rinse BID  . metoprolol tartrate  25 mg Oral Q6H  . multivitamin with minerals  1 tablet Oral Daily  . pantoprazole  20 mg Oral Daily  . protein supplement shake  4 oz Oral BID BM  . sodium chloride flush  3 mL Intravenous Q12H  . cyanocobalamin  2,000 mcg Oral Daily   Continuous Infusions:   PRN Meds: sodium chloride, acetaminophen, metoCLOPramide, nitroGLYCERIN, senna-docusate, sodium chloride flush   Vital Signs    Vitals:   09/26/16 0400 09/26/16 0426 09/26/16 0500 09/26/16 0600  BP:  (!) 158/62 (!) 162/70 (!) 147/72  Pulse: 65  (!) 30 66  Resp:  (!) 27 (!) 24 (!) 28  Temp:   98.6 F (37 C)   TempSrc:   Oral   SpO2: 94%  98% 99%  Weight:   45.5 kg (100 lb 5 oz)   Height:        Intake/Output Summary (Last 24 hours) at 09/26/16 0821 Last data filed at 09/26/16 0100  Gross per 24 hour    Intake           674.27 ml  Output              100 ml  Net           574.27 ml   Filed Weights   09/23/16 0800 09/25/16 0239 09/26/16 0500  Weight: 33.6 kg (74 lb) 45.6 kg (100 lb 8.5 oz) 45.5 kg (100 lb 5 oz)    Physical Exam    GEN: Well nourished, well developed, in no acute distress.  HEENT: Grossly normal.  Neck: Supple, no JVD, carotid bruits, or masses. Cardiac: RRR, no murmurs, rubs, or gallops. No clubbing, cyanosis, edema.  Radials/DP/PT 2+ and equal bilaterally.  Respiratory:  Respirations regular and unlabored, clear to auscultation bilaterally. GI: Soft, nontender, nondistended, BS + x 4. MS: no deformity or atrophy. Skin: warm and dry, no rash. Neuro:  Strength and sensation are intact. Psych: AAOx3.  Normal affect.  Labs    CBC  Recent Labs  09/25/16 0739 09/25/16 1720 09/26/16 0255  WBC 11.4*  --  10.1  HGB 9.4* 9.5* 8.8*  HCT 29.5* 28.0* 27.9*  MCV 82.9  --  82.5  PLT 349  --  292   Basic  Metabolic Panel  Recent Labs  09/24/16 0338 09/25/16 1720 09/26/16 0255  NA 139 137 134*  K 3.8 4.3 4.6  CL 108 108 106  CO2 23  --  20*  GLUCOSE 146* 105* 150*  BUN 18 20 22*  CREATININE 1.60* 1.70* 1.91*  CALCIUM 8.3*  --  8.4*   Liver Function Tests No results for input(s): AST, ALT, ALKPHOS, BILITOT, PROT, ALBUMIN in the last 72 hours. No results for input(s): LIPASE, AMYLASE in the last 72 hours. Cardiac Enzymes  Recent Labs  09/25/16 0056 09/25/16 0739 09/25/16 1240  TROPONINI 5.29* 13.08* 16.60*   BNP Invalid input(s): POCBNP D-Dimer No results for input(s): DDIMER in the last 72 hours. Hemoglobin A1C No results for input(s): HGBA1C in the last 72 hours. Fasting Lipid Panel No results for input(s): CHOL, HDL, LDLCALC, TRIG, CHOLHDL, LDLDIRECT in the last 72 hours. Thyroid Function Tests No results for input(s): TSH, T4TOTAL, T3FREE, THYROIDAB in the last 72 hours.  Invalid input(s): FREET3  Telemetry    Sinus rhythm, PACs -  Personally Reviewed  ECG    09/25/16: Sinus rhythm rate 99 bpm.  PACs. Lateral TWI.  QTc 526.   - Personally Reviewed  Radiology    No results found.  Cardiac Studies   LHC 09/25/16:  Prox LAD to Mid LAD lesion, 80 %stenosed.  Dist LAD lesion, 40 %stenosed.  Ost Cx to Prox Cx lesion, 100 %stenosed.  Dist RCA lesion, 90 %stenosed.  Ost RPDA to RPDA lesion, 100 %stenosed.  Ost RCA to Mid RCA lesion, 100 %stenosed.  SVG graft was visualized by angiography and is normal in caliber.  SVG graft was visualized by angiography.  Origin lesion, 100 %stenosed.  LIMA and is normal in caliber and anatomically normal.  The flow in the graft is reversed.   1. Severe 3 vessel obstructive CAD 2. Patent SVG to the first OM. This fills the distal LCx 3. Occluded SVG to the RCA. The PDA and PLOM branches fill by left to right collaterals.  4. The LIMA graft is widely patent but fills retrograde from the LAD to the subclavian consistent with severe subclavian steal. 5. Mildly elevated LVEDP  Patient Profile     [name] is a 17F with CAD s/p CABG, hypertensive heart disease, and diabetes initially admitted with stroke and now with NSTEMI.  She was found to have severe three vessel CAD with a patent SVG to OM and patent LIMA that fills retrograde due to subclavian steal syndrome. SVG to RCA was occluded.   Assessment & Plan    # NSTEMI: # Subclavian steal: Ms. 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  It is filling retrograde through her LIMA.  They were unable to engage the subclavian stenosis via cardiac catheterization. This will need to be managed surgically. However, given her stroke this admission and non-ST elevation myocardial infarction, she is not a candidate for surgery at this time. We'll manage medically and allow permissive hypertension to permit retrograde perfusion of the left subclavian. She will be evaluated by  vascular surgery as an outpatient. We will add isosorbide mononitrate 30 mg daily to her medical regimen. Blood pressure readings should be obtained in the right arm only.  Blood pressure goal should be 150-160 mmHg systolic.  Continue aspirin, metoprolol and atorvastatin.  We will consolidate metoprolol to 50 mg daily, as she was taking at home. We will restart lisinopril at lower dose of 10 mg daily.  Hold hydrochlorothiazide, given that we are adding Imdur and aiming for a higher blood pressure target.  # Prolonged QT:  No clear culprits.  Avoid QT prolonging agents.  # Hypertension: Blood pressure is poorly-controlled.  See above.   # Stroke: Per Neurology.  # Dispo: OK to be discharged home today if blood pressure is stable and she is able to ambulate independently.  Signed, Chilton Si, MD  09/26/2016, 8:21 AM

## 2016-09-26 NOTE — Progress Notes (Signed)
Patient's blood pressure 177/79. Next metoprolol dose not due until 6am, no PRNs. Dr. Tenny Crawoss paged. Per neurology pt blood pressure okay if <220/120. Will continue to monitor.

## 2016-09-26 NOTE — Progress Notes (Signed)
During rounds Phil charge RN made me aware of pt with new onset CP not relieved with nitro sl x2. Veronica bedside LPN received orders from Dr. Shirlee LatchMcLean obtain EKG, start pt on Nitro gtt, Heparin gtt if no relief with nitro SL and to transfer pt to SDU/ICU. This RN assisted pt with transfer to 2H08. Nitro gtt started, Heparin gtt started PTA. Pt alert oriented, skin warm and dry.

## 2016-09-26 NOTE — Progress Notes (Signed)
Patient discharged, all belongings returned to patient and all IV's removed.  Discharge instructions reviewed with patient and patient's daughter Jiles CrockerDarlene Hanks, all questions answered and patient and Darlene voiced understanding.

## 2016-09-26 NOTE — Telephone Encounter (Signed)
New message      TCM appt on 10-03-16 with Corine ShelterLuke Kilroy per Georgianne FickVin

## 2016-09-26 NOTE — Discharge Summary (Signed)
Discharge Summary    Patient ID: Gwendolyn Wallace,  MRN: [id number], DOB/AGE: [birth year] 80 y.o.  Admit date: 09/21/2016 Discharge date: 09/26/2016  Primary Care Provider: Dwana Wallace Primary Cardiologist: Dr. Duke Wallace ([place])  Discharge Diagnoses    Principal Problem:   TIA (transient ischemic attack) Active Problems:   Insulin dependent diabetes mellitus (HCC)   HYPERTENSION, BENIGN SYSTEMIC   CAD (coronary artery disease)   Carotid artery stenosis   Other vitamin B12 deficiency anemia   Kidney disease   Leukocytosis   Unsteadiness on feet   Uncontrolled type 2 diabetes mellitus with hyperglycemia, with long-term current use of insulin (HCC)   Stroke syndrome (HCC)   NSTEMI (non-ST elevated myocardial infarction) (HCC)   Hypertensive heart disease without heart failure   Subclavian steal syndrome   Allergies Allergies  Allergen Reactions  . Gemfibrozil Other (See Comments)    MYALGIA  . Trulicity [Dulaglutide] Nausea And Vomiting    EXTREME N & V AND LETHARGY  . Tape Rash    Please use paper tape    Diagnostic Studies/Procedures    LHC 09/25/16:  Prox LAD to Mid LAD lesion, 80 %stenosed.  Dist LAD lesion, 40 %stenosed.  Ost Cx to Prox Cx lesion, 100 %stenosed.  Dist RCA lesion, 90 %stenosed.  Ost RPDA to RPDA lesion, 100 %stenosed.  Ost RCA to Mid RCA lesion, 100 %stenosed.  SVG graft was visualized by angiography and is normal in caliber.  SVG graft was visualized by angiography.  Origin lesion, 100 %stenosed.  LIMA and is normal in caliber and anatomically normal.  The flow in the graft is reversed.  1. Severe 3 vessel obstructive CAD 2. Patent SVG to the first OM. This fills the distal LCx 3. Occluded SVG to the RCA. The PDA and PLOM branches fill by left to right collaterals.  4. The LIMA graft is widely patent but fills retrograde from the LAD to the subclavian consistent with severe subclavian steal. 5. Mildly elevated LVEDP  Echo  09/23/16   LV EF: 50% -   55%  ------------------------------------------------------------------- Indications:      TIA 435.9.  ------------------------------------------------------------------- History:   PMH:   Stroke.  Risk factors:  Hypertension. Diabetes mellitus.  ------------------------------------------------------------------- Study Conclusions  - Left ventricle: The cavity size was normal. Wall thickness was   increased in a pattern of mild LVH. Systolic function was at the   lower limits of normal. The estimated ejection fraction was in   the range of 50% to 55%. Features are consistent with a   pseudonormal left ventricular filling pattern, with concomitant   abnormal relaxation and increased filling pressure (grade 2   diastolic dysfunction). Doppler parameters are consistent with   high ventricular filling pressure. - Mitral valve: Severely calcified annulus. There was moderate   regurgitation. - Left atrium: The atrium was severely dilated. - Right ventricle: Systolic function was moderately reduced. - Atrial septum: No defect or patent foramen ovale was identified. - Tricuspid valve: There was moderate regurgitation. - Pulmonary arteries: PA peak pressure: 47 mm Hg (S).  Carotid doppler 09/24/2016 Summary: Findings suggest upper range 1-39% versus low range 40-59% stenosis by plaque morphology and elevated peak systolic velocity. Left internal carotid artery exhibits elevated velocities suggestive of 40-59% stenosis. The left external carotid artery exhibits significantly elevated velocities suggestive of >50% stenosis. The right vertebral artery is patent with antegrade flow. The left vertebral artery is patent with retrograde flow, and the left subclavian artery exhibits monophasic waveforms;  suggestive of possible proximal stenosis.  History of Present Illness     80 yo with history of CAD s/p CABG, CKD, HTN, prior stroke, ongoing tobacco abuse,  herpes zoster, bilateral carotid stenosis and type II diabetes was admitted with dysarthria, dizziness, and imbalance after fall on the evening of 09/21/16. [name] daughter noted that the patient's speech was "babbling", and the patient had word finding difficulties at home. The episode lasted approximately 5 minutes. In addition, the patient also reports also for balance resulting in a fall which resulted in lefthip pain. There was no loss of consciousness. Since her mechanical fall, the patient has reported some weakness in her left leg. CT of the brain was unremarkable. MRI showed mass vs acute stroke. She did not receive IV t-PA due to minimal deficits.  Hospital Course     Consultants: Neurology, IM (Initially as attending)  The patient was admitted on medicine service and neurology as consultant.MRI showed subacute cerebellar infarct versus mass in the left brachium pontis.  MRA neck showed suspected significant proximal RICA stenosis and moderate LICA stenosis.  Neurology felt that MRI findings possibly suggesting subacute infarct since structural  lesion would be less likely.  Unable to obtain CT angiogram or MR angiogram with contrast due to poor renal function. There is flow in the left vertebral artery could represent symptomatic high-grade proximal subclavian stenosis which may need consideration for revascularization in the future. Likely outpatient MRI of brain with contrast in 6 weeks as outpatient once GFR improves.  Echo showed EF 50-55%, mild LVH, moderate MR, moderately decreased RV systolic function with PASP 47 mmHg.  Carotid doppler showed Left ICA--40-59%; R-ICA--1-39% versus low range 40-59% stenosis by plaque morphology and elevated peak systolic velocity Antiplatelet--ASA 325 mg.   On 09/24/16 late afternoon, pt developed CP. Troponins (1.86 initially with peak of 13.08) and EKG showed TWIs leads lateral leads suggested ACS/NSTEMI-->cardiology consulted and pt was placed  on IV heparin once cleared by neurology. Gwendolyn Wallace has 3 vessel CAD with severe stenosis/occlusion of the proximal left subclavian artery.  It is filling retrograde through her LIMA.  They were unable to engage the subclavian stenosis via cardiac catheterization. This will need to be managed surgically. However, given her stroke this admission and non-ST elevation myocardial infarction, she is not a candidate for surgery at this time. We'll manage medically and allow permissive hypertension to permit retrograde perfusion of the left subclavian. She will be evaluated by vascular surgery as an outpatient.   Imdur 30mg  added to regimen. Metoprolol consolidated to 50mg  BID. Scr elevated  to 1.91 from 1.7 at day of discharge after one dose of lisinopril 10mg  qd. . Hold hydrochlorothiazide, given that we are adding Imdur and aiming for a higher blood pressure target.   The patient has been seen by Dr. Duke Wallace today and deemed ready for discharge home. All follow-up appointments have been scheduled. Discharge medications are listed below.    Will need BEMT and CBC in one week. Lipid panel and LFT in 6 weeks due increased lipitor this admission. HbA1C--8.6 f/u with PCP for better management. She was on higher dose of insulin during admission.   Discharge Vitals Blood pressure (!) 158/71, pulse 61, temperature 97.7 F (36.5 C), temperature source Oral, resp. rate (!) 24, height 5' (1.524 m), weight 100 lb 5 oz (45.5 kg), SpO2 94 %.  Filed Weights   09/23/16 0800 09/25/16 0239 09/26/16 0500  Weight: 74 lb (33.6 kg) 100 lb 8.5 oz (45.6 kg) 100  lb 5 oz (45.5 kg)    Labs & Radiologic Studies     CBC  Recent Labs  09/25/16 0739 09/25/16 1720 09/26/16 0255  WBC 11.4*  --  10.1  HGB 9.4* 9.5* 8.8*  HCT 29.5* 28.0* 27.9*  MCV 82.9  --  82.5  PLT 349  --  292   Basic Metabolic Panel  Recent Labs  09/24/16 0338 09/25/16 1720 09/26/16 0255  NA 139 137 134*  K 3.8 4.3 4.6  CL 108 108 106  CO2  23  --  20*  GLUCOSE 146* 105* 150*  BUN 18 20 22*  CREATININE 1.60* 1.70* 1.91*  CALCIUM 8.3*  --  8.4*   Liver Function Tests No results for input(s): AST, ALT, ALKPHOS, BILITOT, PROT, ALBUMIN in the last 72 hours. No results for input(s): LIPASE, AMYLASE in the last 72 hours. Cardiac Enzymes  Recent Labs  09/25/16 0056 09/25/16 0739 09/25/16 1240  TROPONINI 5.29* 13.08* 16.60*   BNP Invalid input(s): POCBNP D-Dimer No results for input(s): DDIMER in the last 72 hours. Hemoglobin A1C No results for input(s): HGBA1C in the last 72 hours. Fasting Lipid Panel No results for input(s): CHOL, HDL, LDLCALC, TRIG, CHOLHDL, LDLDIRECT in the last 72 hours. Thyroid Function Tests No results for input(s): TSH, T4TOTAL, T3FREE, THYROIDAB in the last 72 hours.  Invalid input(s): FREET3  Dg Chest 2 View  Result Date: 09/22/2016 CLINICAL DATA:  80 y/o F; status post fall 3 hours ago with dizziness falling onto right hip. EXAM: CHEST  2 VIEW COMPARISON:  07/13/2016 chest radiograph FINDINGS: Stable cardiac silhouette within normal limits. Status post CABG with sternotomy wires aligned and intact. No focal consolidation. No pneumothorax or pleural effusion. No acute osseous abnormality is identified. IMPRESSION: No active cardiopulmonary disease. Electronically Signed   By: Mitzi Hansen M.D.   On: 09/22/2016 04:50   Mr Maxine Glenn Head Wo Contrast  Result Date: 09/23/2016 CLINICAL DATA:  Left middle cerebellar peduncle lesion. Episode of dizziness and headaches. EXAM: MRA HEAD WITHOUT CONTRAST TECHNIQUE: Angiographic images of the Circle of Willis were obtained using MRA technique without intravenous contrast. COMPARISON:  MRI brain 09/22/2016. FINDINGS: There is asymmetric signal in the right internal carotid artery suggesting decreased profusion in a probable more proximal lesion at the bifurcation. There is moderate narrowing of the supraclinoid left ICA. A left posterior communicating  artery aneurysm measures 3 mm. The right A1 segment is not visualized. The left A1 and M1 segments demonstrate mild proximal narrowing without a significant stenosis. The anterior communicating artery is patent. MCA bifurcations are intact bilaterally. There is moderate distal attenuation of MCA branch vessels bilaterally without a focal proximal stenosis. 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 The right vertebral artery is the dominant vessel. Right PICA origin is visualized and normal. The proximal left vertebral artery is poorly seen. Irregularity is noted in the proximal basilar artery without a significant stenosis relative to the more distal vessel. Both posterior cerebral arteries originate from the basilar tip. There is moderate attenuation of distal branches. IMPRESSION: 1. Asymmetric signal in the right internal carotid artery suggesting right-sided bifurcation or more proximal stenosis and decreased perfusion. 2. Moderate stenosis of the supraclinoid left internal carotid artery. 3. 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 4. Diffuse distal small vessel disease without a significant proximal stenosis or occlusion in either the anterior posterior circulation otherwise. 5. Atherosclerotic changes within the proximal basilar artery without a significant stenosis relative to the  more distal vessel. Electronically Signed   By: Marin Robertshristopher  Mattern M.D.   On: 09/23/2016 15:59   Mr Brain Wo Contrast  Result Date: 09/22/2016 CLINICAL DATA:  80 y/o F; episode of dizziness and headaches accompanied by dysarthria lasting 5 minutes. EXAM: MRI HEAD WITHOUT CONTRAST TECHNIQUE: Multiplanar, multiecho pulse sequences of the brain and surrounding structures were obtained without intravenous contrast. COMPARISON:  None. FINDINGS: Brain: No acute infarction, hemorrhage, hydrocephalus, extra-axial collection or mass lesion.Moderate chronic microvascular ischemic changes  and parenchymal volume loss of the brain. 13 mm diffusion hyperintense, T2 hyperintense, T1 hypointense focus within the left brachium pontis without low ADC (series 5, image 14). Advanced chronic microvascular ischemic changes and parenchymal volume loss of the brain. Vascular: Normal flow voids. Skull and upper cervical spine: Normal marrow signal. Sinuses/Orbits: Complete opacification of left maxillary sinus, left anterior ethmoid air cells, and left frontal sinus, the left middle meatus obstructive pattern. Moderate mucosal thickening of the right maxillary sinus. Partial opacification of left mastoid tip. Orbits are grossly unremarkable. Other: None. IMPRESSION: Motion degradation of several sequences. 1. Nonspecific left brachium pontis lesion without diffusion restriction may represent a late subacute infarct, focus of demyelination, and mass is not excluded. Consider contrast CT or MRI for further characterization. 2. No acute intracranial abnormality identified. 3. Advanced chronic microvascular ischemic changes and parenchymal volume loss. Electronically Signed   By: Mitzi HansenLance  Furusawa-Stratton M.D.   On: 09/22/2016 02:46   Dg Hip Unilat W Or Wo Pelvis 2-3 Views Left  Result Date: 09/21/2016 CLINICAL DATA:  Status post fall on left side, with left hip pain. Initial encounter. EXAM: DG HIP (WITH OR WITHOUT PELVIS) 2-3V LEFT COMPARISON:  None. FINDINGS: There is no evidence of fracture or dislocation. The left femoral head remains seated at the acetabulum. The visualized portions of the left sacroiliac joint are grossly unremarkable. Diffuse vascular calcifications are seen. The visualized bowel gas pattern is unremarkable. IMPRESSION: No evidence of fracture or dislocation. Diffuse vascular calcifications seen. Electronically Signed   By: Roanna RaiderJeffery  Chang M.D.   On: 09/21/2016 18:55   Dg Hip Unilat W Or Wo Pelvis 2-3 Views Right  Result Date: 09/21/2016 CLINICAL DATA:  Initial evaluation for acute  trauma, fall. EXAM: DG HIP (WITH OR WITHOUT PELVIS) 2-3V RIGHT COMPARISON:  None available. FINDINGS: No acute fracture or dislocation. Femoral heads in normal alignment with the acetabula. Femoral head heights preserved. Bony pelvis intact. SI joints approximated. No pubic diastasis. Moderate degenerative osteoarthritic changes about the hips bilaterally. Prominent degenerative changes noted within the lower lumbar spine. Diffuse osteopenia. Prominent vascular calcifications noted. Surgical clips overlie the right inguinal region. IMPRESSION: 1. No acute fracture or dislocation about the right hip. 2. Osteopenia. 3. Atherosclerosis. Electronically Signed   By: Rise MuBenjamin  McClintock M.D.   On: 09/21/2016 18:52    Disposition   Pt is being discharged home today in good condition.  Follow-up Plans & Appointments    Follow-up Information    [place], [place]. Go on 10/03/2016.   Specialties:  Cardiology, Radiology Why:  @11 :00 am for [facility] Contact information: [address] [place] [address] [phone]        SETHI,PRAMOD, MD. Schedule an appointment as soon as possible for a visit in 6 week(s).   Specialties:  Neurology, Radiology Contact information: [address] [place] [address] [phone]        Gwendolyn MelenaZack Hall, MD. Schedule an appointment as soon as possible for a visit in 3 day(s).  Specialty:  Internal Medicine Why:  for hospital follow up.  Contact information: [address] [place] [address] [phone]          Discharge Instructions    Diet - low sodium heart healthy    Complete by:  As directed    Discharge instructions    Complete by:  As directed    NO HEAVY LIFTING (>10lbs) X 2 WEEKS. NO SEXUAL ACTIVITY X 2 WEEKS. NO DRIVING X 1 WEEK. NO SOAKING BATHS, HOT TUBS, POOLS, ETC., X 7 DAYS.  Hold metformin today. Resume tomorrow.   Increase activity slowly    Complete by:  As directed       Discharge Medications    Current Discharge Medication List    START taking these medications   Details  aspirin EC 325 MG tablet Take 1 tablet (325 mg total) by mouth daily. Qty: 30 tablet, Refills: 6    isosorbide mononitrate (IMDUR) 30 MG 24 hr tablet Take 1 tablet (30 mg total) by mouth daily. Qty: 30 tablet, Refills: 6      CONTINUE these medications which have CHANGED   Details  atorvastatin (LIPITOR) 80 MG tablet Take 1 tablet (80 mg total) by mouth daily. Qty: 30 tablet, Refills: 6   Associated Diagnoses: Hyperlipidemia LDL goal <70      CONTINUE these medications which have NOT CHANGED   Details  calcium carbonate (TUMS E-X 750) 750 MG chewable tablet Chew 1 tablet by mouth 2 (two) times daily as needed for heartburn.     cyanocobalamin 2000 MCG tablet Take 2,000 mcg by mouth daily.      insulin degludec (TRESIBA FLEXTOUCH) 100 UNIT/ML SOPN FlexTouch Pen Inject 10 Units into the skin daily.    metFORMIN (GLUCOPHAGE) 1000 MG tablet TAKE ONE AND ONE-HALF TABLET EVERY MORNING AND ONE TABLET AT SUPPER Qty: 75 tablet, Refills: 2    metoprolol (LOPRESSOR) 50 MG tablet TAKE ONE TABLET TWICE DAILY Qty: 60 tablet, Refills: 11    Multiple Vitamin (MULTIVITAMIN) capsule Take 1 capsule by mouth daily as needed (for supplementation).     nitroGLYCERIN (NITROSTAT) 0.4 MG SL tablet Place 0.4 mg under the tongue every 5 (five) minutes as needed for chest pain.     pantoprazole (PROTONIX) 20 MG tablet Take 1 tablet (20 mg total) by mouth daily. Qty: 15 tablet, Refills: 0      STOP taking these medications     aspirin ([facility]IL[employer] ASPIRIN) 81 MG chewable tablet      lisinopril-hydrochlorothiazide (PRINZIDE,ZESTORETIC) 20-12.5 MG per tablet      metoclopramide (REGLAN) 5 MG tablet          Aspirin prescribed at discharge?  Yes High Intensity Statin Prescribed? (Lipitor 40-80mg  or Crestor 20-40mg ): Yes Beta Blocker Prescribed? Yes For EF 45% or less, Was ACEI/ARB Prescribed? Will add once  Scr improves ADP Receptor Inhibitor Prescribed? (i.e. Plavix etc.-Includes Medically Managed Patients): N/A For EF <40%, Aldosterone Inhibitor Prescribed? N/A Was EF assessed during THIS hospitalization? Yes Was Cardiac Rehab II ordered? (Included Medically managed Patients): N/A   Outstanding Labs/Studies   Will need BEMT and CBC in one week. Lipid panel and LFT in 6 weeks due increased lipitor this admission.  Duration of Discharge Encounter   Greater than 30 minutes including physician time.  Signed, Min Collymore PA-C 09/26/2016, 2:30 PM

## 2016-09-26 NOTE — Progress Notes (Signed)
Physical Therapy Treatment Patient Details Name: Gwendolyn HeysBessie M Wallace MRN: [id number] DOB: [birth year] Today's Date: 09/26/2016    History of Present Illness 80 yo female s/p fall with babbled speech for 5 minutes. Pt now with resolved speech deficits. MRI- nonspecific L brachium pontis lesion wihtut diffusion restriction may represent a late subacute infarct focus of demyelination No acute abnormality identified PMH:     PT Comments    Pt admitted with above diagnosis. Pt currently with functional limitations due to balance and endurance deficits. Pt was able to ambulate with RW but with overall poor safety awareness.  Will need SNF unless she has 24 hour care.   Pt will benefit from skilled PT to increase their independence and safety with mobility to allow discharge to the venue listed below.    Follow Up Recommendations  SNF;Supervision/Assistance - 24 hour     Equipment Recommendations  None recommended by PT    Recommendations for Other Services       Precautions / Restrictions Precautions Precautions: Fall Precaution Comments: decreased insight to deficits Restrictions Weight Bearing Restrictions: No    Mobility  Bed Mobility Overal bed mobility: Needs Assistance Bed Mobility: Supine to Sit     Supine to sit: Min assist     General bed mobility comments: Needed assist for elevation of trunk and to move LEs off bed.   Transfers Overall transfer level: Needs assistance Equipment used: Rolling walker (2 wheeled) Transfers: Sit to/from Stand Sit to Stand: Min assist;Mod assist         General transfer comment: assist to power up, increased trunk flexion, pt reaching for object to hold onto  Ambulation/Gait Ambulation/Gait assistance: Min assist;Mod assist Ambulation Distance (Feet): 50 Feet Assistive device: Rolling walker (2 wheeled);None Gait Pattern/deviations: Step-through pattern;Decreased stride length;Decreased step length - right;Decreased stance time -  right;Shuffle Gait velocity: slow Gait velocity interpretation: Below normal speed for age/gender General Gait Details: pt amb 25 feet to bathroom  with pt unsteady needing cues to stay close to RW and cues for sequencing.  Requiring min/modA to maintain stability.  Pt generally unsafe with walker management especially with turning with RW.  Pt required minA for safe walker management.   Stairs            Wheelchair Mobility    Modified Rankin (Stroke Patients Only) Modified Rankin (Stroke Patients Only) Pre-Morbid Rankin Score: Slight disability Modified Rankin: Moderate disability     Balance Overall balance assessment: Needs assistance Sitting-balance support: No upper extremity supported;Feet supported Sitting balance-Leahy Scale: Good Sitting balance - Comments: pt donned shoes on however required increased time   Standing balance support: Bilateral upper extremity supported;During functional activity Standing balance-Leahy Scale: Poor Standing balance comment: Pt must have UE support to stand.                      Cognition Arousal/Alertness: Awake/alert Behavior During Therapy: WFL for tasks assessed/performed Overall Cognitive Status: Impaired/Different from baseline Area of Impairment: Memory;Problem solving;Safety/judgement;Awareness     Memory: Decreased short-term memory   Safety/Judgement: Decreased awareness of safety;Decreased awareness of deficits Awareness: Emergent Problem Solving: Slow processing;Difficulty sequencing      Exercises      General Comments General comments (skin integrity, edema, etc.): Had to assist pt with cleaning after having BM.  Needed to use grab bar to stand up.       Pertinent Vitals/Pain Pain Assessment: No/denies pain  68-72 bpm, 187/79 BP initially and 173/95 once in chair.  100% O2 on RA.     Home Living                      Prior Function            PT Goals (current goals can now be found  in the care plan section) Acute Rehab PT Goals Patient Stated Goal: go home Progress towards PT goals: Progressing toward goals    Frequency    Min 3X/week      PT Plan Current plan remains appropriate    Co-evaluation             End of Session Equipment Utilized During Treatment: Gait belt Activity Tolerance: Patient tolerated treatment well Patient left: in chair;with call bell/phone within reach;with family/visitor present;with chair alarm set     Time: 1047-1109 PT Time Calculation (min) (ACUTE ONLY): 22 min  Charges:  $Gait Training: 8-22 mins                    G CodesBerline Lopes Oct 11, 2016, 1:53 PM [employer] Acute Rehabilitation [phone] (pager)

## 2016-09-26 NOTE — Progress Notes (Signed)
Spoke w pt. She states 1 da lives w her but other fam incl mult grandchildren are in and out checking on her. She states usually not alone. Have alerted donna w [facility] for resumption of [facility] for rn and phy there. Pt states has walker and has no problems w toilet.

## 2016-09-26 NOTE — Progress Notes (Signed)
PROGRESS NOTE    Gwendolyn HeysBessie M Wallace  NWG:[id number] DOB: 04-09-1936 DOA: 09/21/2016 PCP: Dwana MelenaZack Hall, MD   Chief Complaint  Patient presents with  . Dizziness  . Headache    Brief Narrative:  80 y.o.femalewith medical history significant forhypertension, insulin-dependent diabetes mellitus, coronary artery disease status post CABG, and bilateral carotid artery stenosis who presents to the emergency department after a fall and reports transient dysarthria, dysphasiaand dizziness on the evening of 09/21/16. [name] daughter noted that the patient's speech was "babbling", and the patient had word finding difficulties at home. The episode lasted approximately 5 minutes. In addition, the patient also reports also for balance resulting in a fall which resulted in lefthip pain. There was no loss of consciousness. Since her mechanical fall, the patient has reported some weakness in her left leg. CT of the brain was unremarkable. MRI of the brain showed nonspecific left brachium pontis lesion. The patient was admitted for further neurologic workup. Neurology was consulted to assist with management.  On 09/24/16 late afternoon, pt developed CP.  Troponins and EKG suggested ACS/NSTEMI-->cardiology consulted and pt was placed on IV heparin. Cardiac cath on 10/17.  Assessment & Plan   Dysathria/Dysphasia -likely subacute stroke cerebellumvs brain mass -09/22/16--case discussed with Dr. Shela CommonsJ Xu-->MRI brain WITH gad if renal function improves with IVF -Neurology consulted and appreciated. Dr. Pearlean BrownieSethi noted in his note that he discussed potential CVA vs mass with daughters.  Will follow up with patient in 6 weeks.  -PT recommended SNF, OT rec HH -CT brain-neg -MRI brain--nonspecific lesion left brachium pontis -MRA brain--asymmetric signal in the right internal carotid artery suggesting decreased profusion in a probable more proximal lesion at the bifurcation. There is moderate narrowing of the  supraclinoid left ICA -Carotid Duplex-1-39% versus low range 40-59% stenosis by plaque morphology and elevated peak systolic velocity. Left internal carotid artery exhibits elevated velocities suggestive of 40-59% stenosis. The left external carotid artery exhibits significantly elevated velocities  suggestive of >50% stenosis. The right vertebral artery is patent with antegrade flow. The left vertebral artery is patent with retrograde flow, and the left subclavian artery exhibits monophasic waveforms; suggestive of possible proximal stenosis. -Echo--EF 50-55%, grade 2 DD, mod MR/TR -LDL--79-->continue statin -HbA1C--8.6 -Antiplatelet--ASA 325 mg  NSTEMI/CAD -developed CP 10/16 afternoon -cycled troponin 1.86-->5.29-->13.08 -EKG--new TWI V3-V6 -Cardiology consulted and appreciated--> started on IV heparin - Status-post CABG in 2000  -increase lipitor to 80 mg daily -lasix 40 mg IV ordered by cards 10/17 for fluid overload -Cardiac cath on 10/17: 3 vessel CAD with severe stenosis/occlusion of the proximal left subclavian artery. Unable to engage subclavian stenosis via cath- will need surgical intervention.  -Given patient's possible CVA and NSTEMI, not a surgical candidate at this time.  -Cardiology added isosorbide mononitrate 30 mg daily and restarted lisinopril 10mg  daily, continue metoprolol 50mg  daily, statin, aspirin. (Goal SBP 150-160)  Left subclavian stenosis -09/24/16-- carotid US--left subclavian artery exhibits monophasic waveforms; suggestive of possible proximal stenosis. -this explains discordant LEFT and RIGHT arm BPs-->low BPs on LEFT arm -Left arm BPs falsely low.  Use Right arm for BP -Unable to engage subclavian stenosis with cath, will need outpatient vascular follow up  Carotid artery stenosis  -Ultrasound from April 2016 features bilateral stenosis of 50-70%  -Per report of pt's daughter, she was to follow-up with vascular surgery after the ultrasound but never  did  -09/24/16--CUS--Left ICA--40-59%; R-ICA--1-39% versus low range 40-59% stenosis by plaque morphology and elevated peak systolic velocity  Insulin-dependent DM -HbA1c--8.6 -Managed at  home with metformin 1500 mg qD and Tresiba 10 units qD; these will be held while in hospital  -Increase lantus to 18 units -add novolog 4 units with meals  Hypertension  -Managed with Norvasc, lisinopril, HCTZ, and metoprolol at home -held on admission given concern for stroke -Resume metoprolol in setting of NSTEMI -hydralazine prn SBP >210 -Cardiology restarted lisinopril today  Kidney disease of uncertain chronicity  -Baseline SCr 1.5-1.7? -SCr was 0.76 in 2013, the most recent prior value available  -09/23/16--continue IVF--give another liter -creatinine 1.9  Normocytic anemia  -Hgb 11.3 on admission -Baseline Hgb 12-13 -drop in Hgb due to dilution, currently 8.8 -Attributed to B12-deficiency, continue supplementation  Leukocytosis -WBC 12,600 on admission without fever or apparent focus of infection  -Appears to be chronic going back years  -Culture if febrile- remains hemodynamically stable -?low grade CLL vs stress demargination -overall stable -Currently WBC 10.1  DVT Prophylaxis  IV heparin  Code Status: Full  Family Communication: None at bedside  Disposition Plan: Admitted.   Consultants Cardiology Neurology   Procedures  Echocardiogram Carotid ultrasound Cardiac catheterization   Antibiotics   Anti-infectives    None      Subjective:   Gwendolyn Wallace seen and examined today.  Currently has no complaints.  Denies chest pain, shortness of breath, abdominal pain, headache, nausea, or vomiting.  Worried about her kidneys and heart.    Objective:   Vitals:   09/26/16 0600 09/26/16 0700 09/26/16 0800 09/26/16 0900  BP: (!) 147/72 (!) 161/71 (!) 173/68 (!) 160/89  Pulse: 66 65 66 (!) 57  Resp: (!) 28 15 (!) 26 (!) 31  Temp:   98.7 F (37.1 C)     TempSrc:   Oral   SpO2: 99% 100% 100% 100%  Weight:      Height:        Intake/Output Summary (Last 24 hours) at 09/26/16 1003 Last data filed at 09/26/16 0900  Gross per 24 hour  Intake           785.27 ml  Output              100 ml  Net           685.27 ml   Filed Weights   09/23/16 0800 09/25/16 0239 09/26/16 0500  Weight: 33.6 kg (74 lb) 45.6 kg (100 lb 8.5 oz) 45.5 kg (100 lb 5 oz)    Exam  General: Well developed, well nourished, NAD, appears stated age  HEENT: NCAT, mucous membranes moist.   Cardiovascular: S1 S2 auscultated, no rubs, murmurs or gallops. Regular rate and rhythm.  Respiratory: Clear to auscultation bilaterally with equal chest rise  Abdomen: Soft, nontender, nondistended, + bowel sounds  Extremities: warm dry without cyanosis clubbing or edema  Neuro: AAOx3, nonfocal  Psych: Appropriate mood and affect   Data Reviewed: I have personally reviewed following labs and imaging studies  CBC:  Recent Labs Lab 09/21/16 1809 09/22/16 1013 09/23/16 0415 09/25/16 0739 09/25/16 1720 09/26/16 0255  WBC 12.6* 11.9* 13.8* 11.4*  --  10.1  HGB 11.3* 9.6* 10.3* 9.4* 9.5* 8.8*  HCT 35.1* 29.8* 32.5* 29.5* 28.0* 27.9*  MCV 82.8 82.3 84.9 82.9  --  82.5  PLT 395 313 305 349  --  292   Basic Metabolic Panel:  Recent Labs Lab 09/21/16 1809 09/22/16 1013 09/23/16 0415 09/24/16 0338 09/25/16 1720 09/26/16 0255  NA 135 136 139 139 137 134*  K 4.6 3.6 4.5 3.8 4.3 4.6  CL  99* 104 108 108 108 106  CO2 25 23 20* 23  --  20*  GLUCOSE 349* 207* 140* 146* 105* 150*  BUN 19 17 17 18 20  22*  CREATININE 1.66* 1.55* 1.46* 1.60* 1.70* 1.91*  CALCIUM 9.0 8.6* 8.5* 8.3*  --  8.4*   GFR: Estimated Creatinine Clearance: 17.4 mL/min (by C-G formula based on SCr of 1.91 mg/dL (H)). Liver Function Tests: No results for input(s): AST, ALT, ALKPHOS, BILITOT, PROT, ALBUMIN in the last 168 hours. No results for input(s): LIPASE, AMYLASE in the last 168  hours. No results for input(s): AMMONIA in the last 168 hours. Coagulation Profile:  Recent Labs Lab 09/22/16 0310  INR 1.05   Cardiac Enzymes:  Recent Labs Lab 09/24/16 1839 09/25/16 0056 09/25/16 0739 09/25/16 1240  TROPONINI 1.86* 5.29* 13.08* 16.60*   BNP (last 3 results) No results for input(s): PROBNP in the last 8760 hours. HbA1C: No results for input(s): HGBA1C in the last 72 hours. CBG:  Recent Labs Lab 09/25/16 1901 09/25/16 2103 09/26/16 0113 09/26/16 0138 09/26/16 0816  GLUCAP 84 77 67 117* 148*   Lipid Profile: No results for input(s): CHOL, HDL, LDLCALC, TRIG, CHOLHDL, LDLDIRECT in the last 72 hours. Thyroid Function Tests: No results for input(s): TSH, T4TOTAL, FREET4, T3FREE, THYROIDAB in the last 72 hours. Anemia Panel: No results for input(s): VITAMINB12, FOLATE, FERRITIN, TIBC, IRON, RETICCTPCT in the last 72 hours. Urine analysis:    Component Value Date/Time   COLORURINE YELLOW 09/21/2016 1815   APPEARANCEUR CLEAR 09/21/2016 1815   LABSPEC 1.009 09/21/2016 1815   PHURINE 6.5 09/21/2016 1815   GLUCOSEU 250 (A) 09/21/2016 1815   HGBUR SMALL (A) 09/21/2016 1815   BILIRUBINUR NEGATIVE 09/21/2016 1815   KETONESUR NEGATIVE 09/21/2016 1815   PROTEINUR >300 (A) 09/21/2016 1815   NITRITE NEGATIVE 09/21/2016 1815   LEUKOCYTESUR NEGATIVE 09/21/2016 1815   Sepsis Labs: @LABRCNTIP (procalcitonin:4,lacticidven:4)  ) Recent Results (from the past 240 hour(s))  MRSA PCR Screening     Status: None   Collection Time: 09/25/16  2:39 AM  Result Value Ref Range Status   MRSA by PCR NEGATIVE NEGATIVE Final    Comment:        The GeneXpert MRSA Assay (FDA approved for NASAL specimens only), is one component of a comprehensive MRSA colonization surveillance program. It is not intended to diagnose MRSA infection nor to guide or monitor treatment for MRSA infections.       Radiology Studies: No results found.   Scheduled Meds: . aspirin EC   81 mg Oral Daily  . atorvastatin  80 mg Oral Daily  . heparin  5,000 Units Subcutaneous Q8H  . insulin aspart  0-15 Units Subcutaneous TID WC  . insulin aspart  0-5 Units Subcutaneous QHS  . insulin aspart  4 Units Subcutaneous TID WC  . isosorbide mononitrate  30 mg Oral Daily  . lisinopril  10 mg Oral Daily  . mouth rinse  15 mL Mouth Rinse BID  . metoprolol tartrate  50 mg Oral BID  . multivitamin with minerals  1 tablet Oral Daily  . pantoprazole  20 mg Oral Daily  . protein supplement shake  4 oz Oral BID BM  . sodium chloride flush  3 mL Intravenous Q12H  . cyanocobalamin  2,000 mcg Oral Daily   Continuous Infusions:    LOS: 4 days   Time Spent in minutes   30 minutes  Artemis Loyal D.O. on 09/26/2016 at 10:03 AM  Between 7am to 7pm -  Pager - [phone]  After 7pm go to [email or web address] - password [email or web address]  And look for the night coverage person covering for me after hours  [facility] Office  [phone]

## 2016-09-27 ENCOUNTER — Emergency Department (HOSPITAL_COMMUNITY): Payer: PPO

## 2016-09-27 ENCOUNTER — Encounter (HOSPITAL_COMMUNITY): Payer: Self-pay | Admitting: Emergency Medicine

## 2016-09-27 DIAGNOSIS — I110 Hypertensive heart disease with heart failure: Secondary | ICD-10-CM

## 2016-09-27 DIAGNOSIS — E875 Hyperkalemia: Secondary | ICD-10-CM | POA: Diagnosis not present

## 2016-09-27 DIAGNOSIS — E871 Hypo-osmolality and hyponatremia: Secondary | ICD-10-CM | POA: Diagnosis present

## 2016-09-27 DIAGNOSIS — E1165 Type 2 diabetes mellitus with hyperglycemia: Secondary | ICD-10-CM | POA: Diagnosis present

## 2016-09-27 DIAGNOSIS — D649 Anemia, unspecified: Secondary | ICD-10-CM | POA: Diagnosis present

## 2016-09-27 DIAGNOSIS — Z87891 Personal history of nicotine dependence: Secondary | ICD-10-CM | POA: Diagnosis not present

## 2016-09-27 DIAGNOSIS — Z8673 Personal history of transient ischemic attack (TIA), and cerebral infarction without residual deficits: Secondary | ICD-10-CM | POA: Diagnosis not present

## 2016-09-27 DIAGNOSIS — I6523 Occlusion and stenosis of bilateral carotid arteries: Secondary | ICD-10-CM

## 2016-09-27 DIAGNOSIS — Z8249 Family history of ischemic heart disease and other diseases of the circulatory system: Secondary | ICD-10-CM

## 2016-09-27 DIAGNOSIS — Z7982 Long term (current) use of aspirin: Secondary | ICD-10-CM | POA: Diagnosis not present

## 2016-09-27 DIAGNOSIS — R079 Chest pain, unspecified: Secondary | ICD-10-CM

## 2016-09-27 DIAGNOSIS — I214 Non-ST elevation (NSTEMI) myocardial infarction: Secondary | ICD-10-CM | POA: Diagnosis present

## 2016-09-27 DIAGNOSIS — Z79899 Other long term (current) drug therapy: Secondary | ICD-10-CM

## 2016-09-27 DIAGNOSIS — I771 Stricture of artery: Secondary | ICD-10-CM | POA: Diagnosis present

## 2016-09-27 DIAGNOSIS — Z833 Family history of diabetes mellitus: Secondary | ICD-10-CM

## 2016-09-27 DIAGNOSIS — E1122 Type 2 diabetes mellitus with diabetic chronic kidney disease: Secondary | ICD-10-CM | POA: Diagnosis present

## 2016-09-27 DIAGNOSIS — N183 Chronic kidney disease, stage 3 (moderate): Secondary | ICD-10-CM

## 2016-09-27 DIAGNOSIS — J9601 Acute respiratory failure with hypoxia: Secondary | ICD-10-CM | POA: Diagnosis not present

## 2016-09-27 DIAGNOSIS — Z66 Do not resuscitate: Secondary | ICD-10-CM | POA: Diagnosis present

## 2016-09-27 DIAGNOSIS — I200 Unstable angina: Secondary | ICD-10-CM | POA: Diagnosis not present

## 2016-09-27 DIAGNOSIS — N179 Acute kidney failure, unspecified: Secondary | ICD-10-CM | POA: Diagnosis present

## 2016-09-27 DIAGNOSIS — G458 Other transient cerebral ischemic attacks and related syndromes: Secondary | ICD-10-CM | POA: Diagnosis present

## 2016-09-27 DIAGNOSIS — Z9114 Patient's other noncompliance with medication regimen: Secondary | ICD-10-CM | POA: Diagnosis not present

## 2016-09-27 DIAGNOSIS — Z794 Long term (current) use of insulin: Secondary | ICD-10-CM

## 2016-09-27 DIAGNOSIS — I6529 Occlusion and stenosis of unspecified carotid artery: Secondary | ICD-10-CM | POA: Diagnosis present

## 2016-09-27 DIAGNOSIS — I130 Hypertensive heart and chronic kidney disease with heart failure and stage 1 through stage 4 chronic kidney disease, or unspecified chronic kidney disease: Secondary | ICD-10-CM | POA: Diagnosis present

## 2016-09-27 DIAGNOSIS — R739 Hyperglycemia, unspecified: Secondary | ICD-10-CM

## 2016-09-27 DIAGNOSIS — N189 Chronic kidney disease, unspecified: Secondary | ICD-10-CM

## 2016-09-27 DIAGNOSIS — Z951 Presence of aortocoronary bypass graft: Secondary | ICD-10-CM

## 2016-09-27 DIAGNOSIS — E785 Hyperlipidemia, unspecified: Secondary | ICD-10-CM | POA: Diagnosis present

## 2016-09-27 DIAGNOSIS — I10 Essential (primary) hypertension: Secondary | ICD-10-CM | POA: Diagnosis not present

## 2016-09-27 DIAGNOSIS — I5031 Acute diastolic (congestive) heart failure: Secondary | ICD-10-CM | POA: Diagnosis present

## 2016-09-27 DIAGNOSIS — Z515 Encounter for palliative care: Secondary | ICD-10-CM | POA: Diagnosis present

## 2016-09-27 DIAGNOSIS — J811 Chronic pulmonary edema: Secondary | ICD-10-CM

## 2016-09-27 DIAGNOSIS — I708 Atherosclerosis of other arteries: Secondary | ICD-10-CM | POA: Diagnosis present

## 2016-09-27 DIAGNOSIS — N170 Acute kidney failure with tubular necrosis: Secondary | ICD-10-CM | POA: Diagnosis not present

## 2016-09-27 DIAGNOSIS — I2510 Atherosclerotic heart disease of native coronary artery without angina pectoris: Secondary | ICD-10-CM | POA: Diagnosis present

## 2016-09-27 DIAGNOSIS — N289 Disorder of kidney and ureter, unspecified: Secondary | ICD-10-CM

## 2016-09-27 DIAGNOSIS — D72829 Elevated white blood cell count, unspecified: Secondary | ICD-10-CM | POA: Diagnosis present

## 2016-09-27 DIAGNOSIS — IMO0001 Reserved for inherently not codable concepts without codable children: Secondary | ICD-10-CM

## 2016-09-27 DIAGNOSIS — Z823 Family history of stroke: Secondary | ICD-10-CM

## 2016-09-27 DIAGNOSIS — E119 Type 2 diabetes mellitus without complications: Secondary | ICD-10-CM

## 2016-09-27 DIAGNOSIS — I119 Hypertensive heart disease without heart failure: Secondary | ICD-10-CM

## 2016-09-27 LAB — CBC WITH DIFFERENTIAL/PLATELET
Basophils Absolute: 0 10*3/uL (ref 0.0–0.1)
Basophils Relative: 0 %
EOS ABS: 0 10*3/uL (ref 0.0–0.7)
EOS PCT: 0 %
HCT: 27.6 % — ABNORMAL LOW (ref 36.0–46.0)
HEMOGLOBIN: 8.9 g/dL — AB (ref 12.0–15.0)
LYMPHS ABS: 1.9 10*3/uL (ref 0.7–4.0)
LYMPHS PCT: 16 %
MCH: 26.3 pg (ref 26.0–34.0)
MCHC: 32.2 g/dL (ref 30.0–36.0)
MCV: 81.7 fL (ref 78.0–100.0)
MONOS PCT: 9 %
Monocytes Absolute: 1.1 10*3/uL — ABNORMAL HIGH (ref 0.1–1.0)
Neutro Abs: 9.2 10*3/uL — ABNORMAL HIGH (ref 1.7–7.7)
Neutrophils Relative %: 75 %
PLATELETS: 285 10*3/uL (ref 150–400)
RBC: 3.38 MIL/uL — AB (ref 3.87–5.11)
RDW: 14.8 % (ref 11.5–15.5)
WBC: 12.2 10*3/uL — ABNORMAL HIGH (ref 4.0–10.5)

## 2016-09-27 LAB — URINALYSIS, ROUTINE W REFLEX MICROSCOPIC
BILIRUBIN URINE: NEGATIVE
GLUCOSE, UA: 500 mg/dL — AB
Ketones, ur: NEGATIVE mg/dL
Nitrite: NEGATIVE
Protein, ur: >300 mg/dL — AB
SPECIFIC GRAVITY, URINE: 1.027 (ref 1.005–1.030)
pH: 5.5 (ref 5.0–8.0)

## 2016-09-27 LAB — BASIC METABOLIC PANEL
ANION GAP: 13 (ref 5–15)
Anion gap: 11 (ref 5–15)
BUN: 42 mg/dL — AB (ref 6–20)
BUN: 43 mg/dL — ABNORMAL HIGH (ref 6–20)
CALCIUM: 8.6 mg/dL — AB (ref 8.9–10.3)
CHLORIDE: 98 mmol/L — AB (ref 101–111)
CHLORIDE: 99 mmol/L — AB (ref 101–111)
CO2: 18 mmol/L — ABNORMAL LOW (ref 22–32)
CO2: 17 mmol/L — AB (ref 22–32)
CREATININE: 2.77 mg/dL — AB (ref 0.44–1.00)
Calcium: 8.3 mg/dL — ABNORMAL LOW (ref 8.9–10.3)
Creatinine, Ser: 2.98 mg/dL — ABNORMAL HIGH (ref 0.44–1.00)
GFR calc Af Amer: 18 mL/min — ABNORMAL LOW (ref >60)
GFR calc non Af Amer: 15 mL/min — ABNORMAL LOW (ref >60)
GFR calc non Af Amer: 14 mL/min — ABNORMAL LOW (ref >60)
GFR, EST AFRICAN AMERICAN: 16 mL/min — AB (ref >60)
GLUCOSE: 491 mg/dL — AB (ref 65–99)
GLUCOSE: 327 mg/dL — AB (ref 65–99)
POTASSIUM: 6.9 mmol/L — AB (ref 3.5–5.1)
Potassium: 5.6 mmol/L — ABNORMAL HIGH (ref 3.5–5.1)
SODIUM: 127 mmol/L — AB (ref 135–145)
Sodium: 129 mmol/L — ABNORMAL LOW (ref 135–145)

## 2016-09-27 LAB — I-STAT CHEM 8, ED
BUN: 41 mg/dL — AB (ref 6–20)
CALCIUM ION: 1.02 mmol/L — AB (ref 1.15–1.40)
CHLORIDE: 98 mmol/L — AB (ref 101–111)
CREATININE: 2.9 mg/dL — AB (ref 0.44–1.00)
GLUCOSE: 449 mg/dL — AB (ref 65–99)
HCT: 26 % — ABNORMAL LOW (ref 36.0–46.0)
Hemoglobin: 8.8 g/dL — ABNORMAL LOW (ref 12.0–15.0)
POTASSIUM: 6.7 mmol/L — AB (ref 3.5–5.1)
Sodium: 127 mmol/L — ABNORMAL LOW (ref 135–145)
TCO2: 19 mmol/L (ref 0–100)

## 2016-09-27 LAB — I-STAT TROPONIN, ED: TROPONIN I, POC: 3.54 ng/mL — AB (ref 0.00–0.08)

## 2016-09-27 LAB — BRAIN NATRIURETIC PEPTIDE: B Natriuretic Peptide: >4500 pg/mL — ABNORMAL HIGH (ref 0.0–100.0)

## 2016-09-27 LAB — URINE MICROSCOPIC-ADD ON

## 2016-09-27 LAB — CBG MONITORING, ED
Glucose-Capillary: 462 mg/dL — ABNORMAL HIGH (ref 65–99)
Glucose-Capillary: 411 mg/dL — ABNORMAL HIGH (ref 65–99)
Glucose-Capillary: 327 mg/dL — ABNORMAL HIGH (ref 65–99)

## 2016-09-27 LAB — TROPONIN I: TROPONIN I: 3.65 ng/mL — AB (ref <0.03)

## 2016-09-27 LAB — GLUCOSE, CAPILLARY
GLUCOSE-CAPILLARY: 299 mg/dL — AB (ref 65–99)
Glucose-Capillary: 313 mg/dL — ABNORMAL HIGH (ref 65–99)

## 2016-09-27 MED ORDER — ONDANSETRON HCL 4 MG/2ML IJ SOLN
4.0000 mg | Freq: Four times a day (QID) | INTRAMUSCULAR | Status: DC | PRN
  Administered 2016-09-28: 4 mg via INTRAVENOUS
  Filled 2016-09-27: qty 2

## 2016-09-27 MED ORDER — ASPIRIN EC 325 MG PO TBEC
325.0000 mg | DELAYED_RELEASE_TABLET | Freq: Every day | ORAL | Status: DC
  Administered 2016-09-27 – 2016-09-29 (×3): 325 mg via ORAL
  Filled 2016-09-27 (×3): qty 1

## 2016-09-27 MED ORDER — GI COCKTAIL ~~LOC~~
30.0000 mL | Freq: Four times a day (QID) | ORAL | Status: DC | PRN
  Administered 2016-09-29: 30 mL via ORAL
  Filled 2016-09-27 (×2): qty 30

## 2016-09-27 MED ORDER — ACETAMINOPHEN 325 MG PO TABS
650.0000 mg | ORAL_TABLET | ORAL | Status: DC | PRN
  Administered 2016-09-28: 650 mg via ORAL
  Filled 2016-09-27 (×2): qty 2

## 2016-09-27 MED ORDER — CALCIUM GLUCONATE 10 % IV SOLN
1.0000 g | Freq: Once | INTRAVENOUS | Status: AC
  Administered 2016-09-27: 1 g via INTRAVENOUS
  Filled 2016-09-27: qty 10

## 2016-09-27 MED ORDER — ENOXAPARIN SODIUM 30 MG/0.3ML ~~LOC~~ SOLN
30.0000 mg | SUBCUTANEOUS | Status: DC
Start: 2016-09-27 — End: 2016-10-01
  Administered 2016-09-27 – 2016-09-29 (×3): 30 mg via SUBCUTANEOUS
  Filled 2016-09-27 (×3): qty 0.3

## 2016-09-27 MED ORDER — ISOSORBIDE MONONITRATE ER 30 MG PO TB24
30.0000 mg | ORAL_TABLET | Freq: Every day | ORAL | Status: DC
  Administered 2016-09-27 – 2016-09-29 (×3): 30 mg via ORAL
  Filled 2016-09-27 (×3): qty 1

## 2016-09-27 MED ORDER — ADULT MULTIVITAMIN W/MINERALS CH: 1.0000 | ORAL_TABLET | Freq: Every day | ORAL | Status: DC | PRN

## 2016-09-27 MED ORDER — SODIUM CHLORIDE 0.9 % IV SOLN
INTRAVENOUS | Status: AC
  Administered 2016-09-27: 18:00:00 via INTRAVENOUS

## 2016-09-27 MED ORDER — MORPHINE SULFATE (PF) 2 MG/ML IV SOLN: 2.0000 mg | INTRAVENOUS | Status: DC | PRN

## 2016-09-27 MED ORDER — INSULIN ASPART 100 UNIT/ML ~~LOC~~ SOLN
10.0000 [IU] | Freq: Once | SUBCUTANEOUS | Status: AC
  Administered 2016-09-27: 10 [IU] via INTRAVENOUS
  Filled 2016-09-27: qty 1

## 2016-09-27 MED ORDER — RANOLAZINE ER 500 MG PO TB12
500.0000 mg | ORAL_TABLET | Freq: Two times a day (BID) | ORAL | Status: DC
  Administered 2016-09-27 – 2016-09-29 (×5): 500 mg via ORAL
  Filled 2016-09-27 (×5): qty 1

## 2016-09-27 MED ORDER — GI COCKTAIL ~~LOC~~
30.0000 mL | Freq: Once | ORAL | Status: AC
  Administered 2016-09-27: 30 mL via ORAL
  Filled 2016-09-27: qty 30

## 2016-09-27 MED ORDER — VITAMIN B-12 1000 MCG PO TABS
2000.0000 ug | ORAL_TABLET | Freq: Every day | ORAL | Status: DC
  Administered 2016-09-27 – 2016-09-29 (×3): 2000 ug via ORAL
  Filled 2016-09-27 (×4): qty 2

## 2016-09-27 MED ORDER — INSULIN GLARGINE 100 UNIT/ML ~~LOC~~ SOLN
10.0000 [IU] | Freq: Every day | SUBCUTANEOUS | Status: DC
  Administered 2016-09-27 – 2016-09-29 (×3): 10 [IU] via SUBCUTANEOUS
  Filled 2016-09-27 (×4): qty 0.1

## 2016-09-27 MED ORDER — INSULIN ASPART 100 UNIT/ML ~~LOC~~ SOLN
0.0000 [IU] | Freq: Three times a day (TID) | SUBCUTANEOUS | Status: DC
  Administered 2016-09-28 (×2): 5 [IU] via SUBCUTANEOUS
  Administered 2016-09-28: 11 [IU] via SUBCUTANEOUS
  Administered 2016-09-29: 3 [IU] via SUBCUTANEOUS
  Administered 2016-09-29: 5 [IU] via SUBCUTANEOUS
  Administered 2016-09-29 – 2016-09-30 (×3): 3 [IU] via SUBCUTANEOUS

## 2016-09-27 MED ORDER — INSULIN ASPART 100 UNIT/ML ~~LOC~~ SOLN
0.0000 [IU] | Freq: Every day | SUBCUTANEOUS | Status: DC
  Administered 2016-09-27: 4 [IU] via SUBCUTANEOUS
  Administered 2016-09-29: 2 [IU] via SUBCUTANEOUS

## 2016-09-27 MED ORDER — ALPRAZOLAM 0.25 MG PO TABS
0.2500 mg | ORAL_TABLET | Freq: Two times a day (BID) | ORAL | Status: DC | PRN
  Filled 2016-09-27: qty 1

## 2016-09-27 MED ORDER — METOPROLOL TARTRATE 50 MG PO TABS
50.0000 mg | ORAL_TABLET | Freq: Two times a day (BID) | ORAL | Status: DC
  Administered 2016-09-27 – 2016-09-29 (×4): 50 mg via ORAL
  Filled 2016-09-27 (×5): qty 1

## 2016-09-27 MED ORDER — INSULIN ASPART 100 UNIT/ML ~~LOC~~ SOLN
0.0000 [IU] | Freq: Three times a day (TID) | SUBCUTANEOUS | Status: DC
Start: 2016-09-27 — End: 2016-09-27
  Administered 2016-09-27: 8 [IU] via SUBCUTANEOUS

## 2016-09-27 MED ORDER — ATORVASTATIN CALCIUM 80 MG PO TABS
81.0000 mg | ORAL_TABLET | Freq: Every day | ORAL | Status: DC
  Administered 2016-09-27 – 2016-09-29 (×3): 80 mg via ORAL
  Filled 2016-09-27 (×3): qty 1

## 2016-09-27 NOTE — ED Notes (Signed)
Placed patient on the bedpan to get a urine sample, pt. Did use it got the urine sample

## 2016-09-27 NOTE — Progress Notes (Signed)
Troponin = 3.65. This has increased from 3.54 at noon, but is overall down from 16.6 on 09/25/16. MD paged. Will continue to monitor.

## 2016-09-27 NOTE — ED Notes (Signed)
Critical Lab:  6.9 Potassium R. Dahlia ClientBrowning, GeorgiaPA, and Dr. Corlis LeakMacKuen, MD

## 2016-09-27 NOTE — ED Notes (Signed)
PA at bedside.

## 2016-09-27 NOTE — Telephone Encounter (Signed)
Daughter states patient could not come to phone  Patient's daughter Agustin CreeDarlene contacted regarding discharge from [facility] on 10/ 18/2017.  Patient understands to follow up with provider Corine ShelterLUKE KILROY PA on 10/03/16 at 11 AM  [facility]. Patient understands discharge instructions? yes  Patient understands medications and regiment? yes Patient understands to bring all medications to this visit? yeS

## 2016-09-27 NOTE — H&P (Signed)
History and Physical    Gwendolyn Wallace JXB:[id number] DOB: 10/29/1936 DOA: 09/10/2016  PCP: Dwana Melena, MD Patient coming from: home  Chief Complaint: sob/chest pain/generalized weakness  HPI: Gwendolyn Wallace is a 80 y.o. female with medical history significant for recent any status post cardiac cath with severe 3 vessel obstructive disease, diabetes, hypertension, anemia, hyperlipidemia, chronic kidney disease since emergency department with the chief complaint shortness of breath and chest pain and uncontrolled diabetes hyperkalemia hyponatremia worsening chronic kidney disease.  Information is obtained from the patient and her daughter with him she resides. Patient was discharged yesterday after NSTEMI status post cath by cardiology. Daughter reports she was discharged and had generalized weakness and home health PT was recommended. Last evening when" okay". She ate very little but  Did drink protein shakes. She was short of breath with exertion but recovered quickly. This morning patient awakened around 6 AM complaining of worsening shortness of breath and chest pain.. Daughter reports she was pale and diaphoretic and short of breath. Patient did not want 911 to be called. She calm down a went back to sleep. He describes the pain as an ache located left anterior nonradiating somewhat in her back. When she awakened the second time she remained somewhat pale and short of breath and patient reports the pain in her back was worse. 911 was called she was transported to the emergency department. Patient is on insulin but does not check her blood sugar. He denies headache dizziness abdominal pain nausea vomiting dysuria hematuria frequency or urgency. He denies lower extremity edema but does describe some orthopnea.    ED Course: In the emergency department she is afebrile hemodynamically stable and not hypoxic. She is provided with calcium gluconate IV insulin for potassium of 6.9 and a serum glucose  500.  Review of Systems: As per HPI otherwise 10 point review of systems negative.   Ambulatory Status: Ambulates with a walker with an unsteady gait. Recently discharged from hospital with home health physical therapy recommended  Past Medical History:  Diagnosis Date  . Carotid stenosis, bilateral 08/07/05   bilat 40-60%  . CVA (cerebral infarction) 07/10/02  . Diabetes mellitus without complication (HCC)   . Gastric bleeding 10/01  . Herpes zoster 4/01  . Hypertension     Past Surgical History:  Procedure Laterality Date  . CARDIAC CATHETERIZATION N/A 09/25/2016   Procedure: Left Heart Cath and Coronary Angiography;  Surgeon: Peter M Sw[place], MD;  Location: [facility] INVASIVE CV LAB;  Service: Cardiovascular;  Laterality: N/A;  . CATARACT EXTRACTION, BILATERAL  07/30/2006  . CORONARY ARTERY BYPASS GRAFT  03/11/1999  . PANRETINAL PHOTOCOAGULATION  07/10/2002  . PANRETINAL PHOTOCOAGULATION  4/05    Social History   Social History  . Marital status: Widowed    Spouse name: N/A  . Number of children: N/A  . Years of education: N/A   Occupational History  . retired    Social History Main Topics  . Smoking status: Former Smoker    Packs/day: 0.25    Types: Cigarettes    Quit date: 09/22/2016  . Smokeless tobacco: Never Used  . Alcohol use No  . Drug use: No  . Sexual activity: No   Other Topics Concern  . Not on file   Social History Narrative   Second husband, Morris, died   Daughter and her children live with her    Allergies  Allergen Reactions  . Gemfibrozil Other (See Comments)    MYALGIA  . Trulicity [  Dulaglutide] Nausea And Vomiting    EXTREME N & V AND LETHARGY  . Tape Rash    Please use paper tape    Family History  Problem Relation Age of Onset  . Stroke Brother   . Diabetes Mother   . Diabetes Son   . Diabetes Brother   . Heart attack Brother   . Cholecystitis Daughter   . Cholecystitis Daughter   . Heart attack Father     Prior to Admission  medications   Medication Sig Start Date End Date Taking? Authorizing Provider  aspirin EC 325 MG tablet Take 1 tablet (325 mg total) by mouth daily. 09/26/16  Yes Bhavinkumar Bhagat, PA  atorvastatin (LIPITOR) 80 MG tablet Take 1 tablet (80 mg total) by mouth daily. 09/26/16  Yes Bhavinkumar Bhagat, PA  calcium carbonate (TUMS E-X 750) 750 MG chewable tablet Chew 1 tablet by mouth 2 (two) times daily as needed for heartburn.    Yes Historical Provider, MD  cyanocobalamin 2000 MCG tablet Take 2,000 mcg by mouth daily.     Yes Zachery DauerWayne Hale, MD  insulin degludec (TRESIBA FLEXTOUCH) 100 UNIT/ML SOPN FlexTouch Pen Inject 10 Units into the skin daily.   Yes Historical Provider, MD  isosorbide mononitrate (IMDUR) 30 MG 24 hr tablet Take 1 tablet (30 mg total) by mouth daily. December 29, 2015  Yes Bhavinkumar Bhagat, PA  metFORMIN (GLUCOPHAGE) 1000 MG tablet TAKE ONE AND ONE-HALF TABLET EVERY MORNING AND ONE TABLET AT SUPPER 10/05/13  Yes Uvaldo RisingKyle J Fletke, MD  metoprolol (LOPRESSOR) 50 MG tablet TAKE ONE TABLET TWICE DAILY Patient taking differently: TAKE ONE TABLET (50 mg) TWICE DAILY 10/02/12  Yes Zachery DauerWayne Hale, MD  Multiple Vitamin (MULTIVITAMIN) capsule Take 1 capsule by mouth daily as needed (for supplementation).    Yes Historical Provider, MD  pantoprazole (PROTONIX) 20 MG tablet Take 1 tablet (20 mg total) by mouth daily. Patient taking differently: Take 20 mg by mouth daily as needed for heartburn or indigestion.  07/13/16  Yes Donnetta HutchingBrian Cook, MD  nitroGLYCERIN (NITROSTAT) 0.4 MG SL tablet Place 0.4 mg under the tongue every 5 (five) minutes as needed for chest pain.     Historical Provider, MD    Physical Exam: Vitals:   December 29, 2015 1245 December 29, 2015 1315 December 29, 2015 1345 December 29, 2015 1415  BP: 174/71 179/65 171/69 164/75  Pulse: (!) 59 (!) 58 61 61  Resp: 20 (!) 27 21 22   Temp:      SpO2: 97% 100% 100% 100%  Weight:      Height:         General:  Appears Somewhat anxious and comfortable, and frail Eyes:  PERRL, EOMI,  normal lids, iris ENT:  grossly normal hearing, lips & tongue, mmm Neck:  no LAD, masses or thyromegaly Cardiovascular:  RRR, no m/r/g. No LE edema. Pedal pulses present and palpable Respiratory:  Mild increased work of breathing with pursed somewhat shallow she is able to complete sentences breath sounds somewhat distant but clear mild diffuse expiratory wheeze Abdomen:  soft, ntnd, NABS Skin:  no rash or induration seen on limited exam Musculoskeletal:  grossly normal tone BUE/BLE, good ROM, no bony abnormality Psychiatric:  grossly normal mood and affect, speech fluent and appropriate, AOx3 Neurologic:  CN 2-12 grossly intact, moves all extremities in coordinated fashion, sensation intact speech clear facial symmetry  Labs on Admission: I have personally reviewed following labs and imaging studies  CBC:  Recent Labs Lab 09/22/16 1013 09/23/16 0415 09/25/16 0739 09/25/16 1720 09/26/16 0255 December 29, 2015 1134 December 29, 2015  1236  WBC 11.9* 13.8* 11.4*  --  10.1 12.2*  --   NEUTROABS  --   --   --   --   --  9.2*  --   HGB 9.6* 10.3* 9.4* 9.5* 8.8* 8.9* 8.8*  HCT 29.8* 32.5* 29.5* 28.0* 27.9* 27.6* 26.0*  MCV 82.3 84.9 82.9  --  82.5 81.7  --   PLT 313 305 349  --  292 285  --    Basic Metabolic Panel:  Recent Labs Lab 09/22/16 1013 09/23/16 0415 09/24/16 0338 09/25/16 1720 09/26/16 0255 09/21/2016 1134 09/09/2016 1236  NA 136 139 139 137 134* 127* 127*  K 3.6 4.5 3.8 4.3 4.6 6.9* 6.7*  CL 104 108 108 108 106 98* 98*  CO2 23 20* 23  --  20* 18*  --   GLUCOSE 207* 140* 146* 105* 150* 491* 449*  BUN 17 17 18 20  22* 42* 41*  CREATININE 1.55* 1.46* 1.60* 1.70* 1.91* 2.77* 2.90*  CALCIUM 8.6* 8.5* 8.3*  --  8.4* 8.3*  --    GFR: Estimated Creatinine Clearance: 11.5 mL/min (by C-G formula based on SCr of 2.9 mg/dL (H)). Liver Function Tests: No results for input(s): AST, ALT, ALKPHOS, BILITOT, PROT, ALBUMIN in the last 168 hours. No results for input(s): LIPASE, AMYLASE in the last  168 hours. No results for input(s): AMMONIA in the last 168 hours. Coagulation Profile:  Recent Labs Lab 09/22/16 0310  INR 1.05   Cardiac Enzymes:  Recent Labs Lab 09/24/16 1839 09/25/16 0056 09/25/16 0739 09/25/16 1240  TROPONINI 1.86* 5.29* 13.08* 16.60*   BNP (last 3 results) No results for input(s): PROBNP in the last 8760 hours. HbA1C: No results for input(s): HGBA1C in the last 72 hours. CBG:  Recent Labs Lab 09/26/16 0138 09/26/16 0816 09/26/16 1138 09/20/2016 1306 10/03/2016 1351  GLUCAP 117* 148* 216* 462* 411*   Lipid Profile: No results for input(s): CHOL, HDL, LDLCALC, TRIG, CHOLHDL, LDLDIRECT in the last 72 hours. Thyroid Function Tests: No results for input(s): TSH, T4TOTAL, FREET4, T3FREE, THYROIDAB in the last 72 hours. Anemia Panel: No results for input(s): VITAMINB12, FOLATE, FERRITIN, TIBC, IRON, RETICCTPCT in the last 72 hours. Urine analysis:    Component Value Date/Time   COLORURINE YELLOW 10/04/2016 1337   APPEARANCEUR HAZY (A) 09/22/2016 1337   LABSPEC 1.027 09/21/2016 1337   PHURINE 5.5 09/09/2016 1337   GLUCOSEU 500 (A) 09/19/2016 1337   HGBUR SMALL (A) 09/30/2016 1337   BILIRUBINUR NEGATIVE 09/12/2016 1337   KETONESUR NEGATIVE 09/20/2016 1337   PROTEINUR >300 (A) 09/25/2016 1337   NITRITE NEGATIVE 10/08/2016 1337   LEUKOCYTESUR SMALL (A) 09/09/2016 1337    Creatinine Clearance: Estimated Creatinine Clearance: 11.5 mL/min (by C-G formula based on SCr of 2.9 mg/dL (H)).  Sepsis Labs: @LABRCNTIP (procalcitonin:4,lacticidven:4) ) Recent Results (from the past 240 hour(s))  MRSA PCR Screening     Status: None   Collection Time: 09/25/16  2:39 AM  Result Value Ref Range Status   MRSA by PCR NEGATIVE NEGATIVE Final    Comment:        The GeneXpert MRSA Assay (FDA approved for NASAL specimens only), is one component of a comprehensive MRSA colonization surveillance program. It is not intended to diagnose MRSA infection nor to  guide or monitor treatment for MRSA infections.      Radiological Exams on Admission: Dg Chest 2 View  Result Date: 09/25/2016 CLINICAL DATA:  Mid and left-sided chest pain for couple weeks. History of pneumonia and diabetes.  EXAM: CHEST  2 VIEW COMPARISON:  09/22/2016 FINDINGS: Status post median sternotomy and CABG. Heart is enlarged. There are bilateral pleural effusions. Diffuse interstitial markings are most likely related to pulmonary edema. More focal opacity in the medial lung bases could represent edema and/or infectious process. IMPRESSION: Cardiomegaly and changes of pulmonary edema. Bilateral lower lobe edema and/or infection. Electronically Signed   By: Norva Pavlov M.D.   On: 19-Oct-2016 12:03   Ct Head Wo Contrast  Result Date: 19-Oct-2016 CLINICAL DATA:  Confusion, chair spine EXAM: CT HEAD WITHOUT CONTRAST TECHNIQUE: Contiguous axial images were obtained from the base of the skull through the vertex without intravenous contrast. COMPARISON:  09/22/2016 FINDINGS: Brain: No intracranial hemorrhage, mass effect or midline shift. Stable atrophy and chronic white matter disease. No definite acute cortical infarction. No mass lesion is noted on this unenhanced scan. Vascular: Atherosclerotic calcifications of carotid siphon. Skull: No skull fracture is noted. Sinuses/Orbits: There is mucosal thickening with partial opacification of left anterior ethmoid air cells. Mucosal thickening with almost complete opacification left maxillary sinus. Mucosal thickening with opacification left frontal sinus. Other: None IMPRESSION: No acute intracranial abnormality. Stable atrophy and chronic white matter disease. Paranasal sinuses disease as described above. Electronically Signed   By: Natasha Mead M.D.   On: Oct 19, 2016 13:27    EKG: Independently reviewed. Sinus rhythm Right axis deviation Consider left ventricular hypertrophy Abnrm T, consider ischemia, anterolateral  lds  Assessment/Plan Principal Problem:   Chest pain Active Problems:   Insulin dependent diabetes mellitus (HCC)   Hyperlipidemia   Absolute anemia   HYPERTENSION, BENIGN SYSTEMIC   CAD (coronary artery disease)   Carotid artery stenosis   Kidney disease   Hypertensive heart disease without heart failure   Hyperkalemia   Acute renal failure superimposed on chronic kidney disease (HCC)   Hyponatremia   #1. Chest pain. Heart score 5. Recent NSTEMI and cath feeling severe three-vessel disease. Discharged yesterday. EKG without acute changes from 2 days ago, troponins trending down. Chest x-ray cardiomegaly with pulmonary edema and/or infectious process. She is afebrile mild leukocytosis nontoxic appearing -Admit to telemetry -Cycle troponin -Serial EKG -Supportive therapy -Continue home aspirin and statin -Await cardiology input  #2. Shortness of breath. Hypoxic. Chest x-ray with pulmonary edema and/or infectious process. Mild leukocytosis afebrile nontoxic appearing. Review indicates history of diastolic dysfunction with an EF of 50-55%. Medications do not include diuretic. -Monitor -Daily weights -Intake and output -Oxygen supplementation as needed -Continue home meds -Defer to cardiology  #3. Diabetes. Uncontrolled. Globin A1c 8.6 last week. Home meds include metformin and long-acting insulin. She is noncompliant with meds and diet. Glucose 449 on admission. An ion gap is 11. She is hyponatremic with hyperkalemia and worsening reactive to the main. Given IV insulin and calcium gluconate in the emergency department as noted above -We'll hold metformin -Provide Lantus -Sliding scale insulin for optimal control -Modified diet  #4. Hyperkalemia hyponatremia. Ilikely related to #3.  Expect will correct as #3 resolves. Provided with  IV insulin and calcium. CBG 327 after IV insulin.  -see #3.  -bmet this evening -CBG monitoring -insulin as noted above  5. Acute on chronic  kidney disease stage II. Creatinine 1.91 yesterday when patient was discharged. This morning creatinine 2.9. Likely related to above. -Monitor urine output -Hold nephrotoxins -Recheck in the morning  #6. Hypertension. Fair control in the emergency department. Home medications include imdur metoprolol -Continue home meds -Monitor closely  #7. Anemia. History of same. Likely chronic disease. Hemoglobin 8.8.  This is close to her baseline. No signs symptoms of active bleeding -monitor  DVT prophylaxis: lovenox Code Status: full  Family Communication: daughter at bedside  Disposition Plan: home when ready  Consults called: cardiology per ED  Admission status: inpatient    Gwenyth Bender MD [facility]  If 7PM-7AM, please contact night-coverage [email or web address] Password [email or web address]  10-07-2016, 2:46 PM

## 2016-09-27 NOTE — ED Triage Notes (Signed)
Pt arrives from home via EMS reporting CP with SOB this am, resolved at this time.  Pt reports recent MI.  NAD noted at this time. Pt pale, skin warm and dry.

## 2016-09-27 NOTE — ED Notes (Signed)
Attempted to call report

## 2016-09-27 NOTE — ED Provider Notes (Signed)
[facility]-EMERGENCY DEPT Provider Note   CSN: [id number] Arrival date & time: 09/30/2016  1117     History   Chief Complaint Chief Complaint  Patient presents with  . Chest Pain  . Shortness of Breath    HPI Gwendolyn Wallace is a 80 y.o. female.  Patient presents to the emergency department with chief complaint of chest pain shortness breath. She has company by family members, state that the patient has been exceptionally short of breath since coming home from the hospital yesterday. They report that her symptoms are worsened with exertion.  Patient was recently admitted for TIA, but the admission was complicated by development of NSTEMI and was seen and cathed by cardiology.   The history is provided by the patient. No language interpreter was used.    Past Medical History:  Diagnosis Date  . Carotid stenosis, bilateral 08/07/05   bilat 40-60%  . CVA (cerebral infarction) 07/10/02  . Diabetes mellitus without complication (HCC)   . Gastric bleeding 10/01  . Herpes zoster 4/01  . Hypertension     Patient Active Problem List   Diagnosis Date Noted  . Subclavian steal syndrome   . NSTEMI (non-ST elevated myocardial infarction) (HCC)   . Hypertensive heart disease without heart failure   . Uncontrolled type 2 diabetes mellitus with hyperglycemia, with long-term current use of insulin (HCC) 09/24/2016  . Stroke syndrome (HCC)   . Unsteadiness on feet   . Kidney disease 09/22/2016  . Leukocytosis 09/22/2016  . TIA (transient ischemic attack) 09/22/2016  . At high risk for falls 04/02/2013  . Osteoarthritis of hand, left 08/26/2012  . Other vitamin B12 deficiency anemia 12/05/2010  . Absolute anemia 03/20/2010  . Personal history of fall 11/08/2009  . DYSTROPHIC NAILS 02/24/2008  . Gastroparesis 05/27/2007  . PVD WITH CLAUDICATION 04/03/2007  . IMPAIRMENT, ONE EYE MODERATE, OTH NEAR-NORMAL 02/25/2007  . Insulin dependent diabetes mellitus (HCC) 02/06/2007  . HYPERLIPIDEMIA  02/06/2007  . TOBACCO DEPENDENCE 02/06/2007  . HYPERTENSION, BENIGN SYSTEMIC 02/06/2007  . CAD (coronary artery disease) 02/06/2007  . Carotid artery stenosis 02/06/2007  . PEPTIC ULCER DIS., UNSPEC. W/O OBSTRUCTION 02/06/2007  . INCONTINENCE, STRESS, FEMALE 02/06/2007  . Osteoporosis, unspecified 02/06/2007  . CEREBROVAS DIS, LATE EFFECTS (S/P CVA) 02/06/2007    Past Surgical History:  Procedure Laterality Date  . CARDIAC CATHETERIZATION N/A 09/25/2016   Procedure: Left Heart Cath and Coronary Angiography;  Surgeon: Peter M SwazilandJordan, MD;  [facility];  Service: Cardiovascular;  Laterality: N/A;  . CATARACT EXTRACTION, BILATERAL  07/30/2006  . CORONARY ARTERY BYPASS GRAFT  03/11/1999  . PANRETINAL PHOTOCOAGULATION  07/10/2002  . PANRETINAL PHOTOCOAGULATION  4/05    OB History    No data available       Home Medications    Prior to Admission medications   Medication Sig Start Date End Date Taking? Authorizing Provider  aspirin EC 325 MG tablet Take 1 tablet (325 mg total) by mouth daily. 09/26/16   Bhavinkumar Bhagat, PA  atorvastatin (LIPITOR) 80 MG tablet Take 1 tablet (80 mg total) by mouth daily. 09/26/16   Bhavinkumar Bhagat, PA  calcium carbonate (TUMS E-X 750) 750 MG chewable tablet Chew 1 tablet by mouth 2 (two) times daily as needed for heartburn.     Historical Provider, MD  cyanocobalamin 2000 MCG tablet Take 2,000 mcg by mouth daily.      Zachery DauerWayne Hale, MD  insulin degludec (TRESIBA FLEXTOUCH) 100 UNIT/ML SOPN FlexTouch Pen Inject 10 Units into  the skin daily.    Historical Provider, MD  isosorbide mononitrate (IMDUR) 30 MG 24 hr tablet Take 1 tablet (30 mg total) by mouth daily. 2016-10-11   Bhavinkumar Bhagat, PA  metFORMIN (GLUCOPHAGE) 1000 MG tablet TAKE ONE AND ONE-HALF TABLET EVERY MORNING AND ONE TABLET AT SUPPER 10/05/13   Uvaldo Rising, MD  metoprolol (LOPRESSOR) 50 MG tablet TAKE ONE TABLET TWICE DAILY Patient taking differently: TAKE ONE TABLET (50  mg) TWICE DAILY 10/02/12   Zachery Dauer, MD  Multiple Vitamin (MULTIVITAMIN) capsule Take 1 capsule by mouth daily as needed (for supplementation).     Historical Provider, MD  nitroGLYCERIN (NITROSTAT) 0.4 MG SL tablet Place 0.4 mg under the tongue every 5 (five) minutes as needed for chest pain.     Historical Provider, MD  pantoprazole (PROTONIX) 20 MG tablet Take 1 tablet (20 mg total) by mouth daily. Patient taking differently: Take 20 mg by mouth daily as needed for heartburn or indigestion.  07/13/16   Donnetta Hutching, MD    Family History Family History  Problem Relation Age of Onset  . Stroke Brother   . Diabetes Mother   . Diabetes Son   . Diabetes Brother   . Heart attack Brother   . Cholecystitis Daughter   . Cholecystitis Daughter   . Heart attack Father     Social History Social History  Substance Use Topics  . Smoking status: Former Smoker    Packs/day: 0.25    Types: Cigarettes    Quit date: 09/22/2016  . Smokeless tobacco: Never Used  . Alcohol use No     Allergies   Gemfibrozil; Trulicity [dulaglutide]; and Tape   Review of Systems Review of Systems  Respiratory: Positive for shortness of breath.   Cardiovascular: Positive for chest pain.  All other systems reviewed and are negative.    Physical Exam Updated Vital Signs BP 177/76 (BP Location: Right Arm) Comment: Simultaneous filing. User may not have seen previous data.  Pulse 61 Comment: Simultaneous filing. User may not have seen previous data.  Temp 97.7 F (36.5 C)   Resp 25 Comment: Simultaneous filing. User may not have seen previous data.  Ht 5' (1.524 m)   Wt 45.5 kg   SpO2 100%   BMI 19.59 kg/m   Physical Exam  Constitutional: She appears well-developed and well-nourished.  HENT:  Head: Normocephalic and atraumatic.  Eyes: Conjunctivae and EOM are normal. Pupils are equal, round, and reactive to light.  Neck: Normal range of motion. Neck supple.  Cardiovascular: Normal rate and regular  rhythm.  Exam reveals no gallop and no friction rub.   No murmur heard. Pulmonary/Chest: Effort normal and breath sounds normal. No respiratory distress. She has no wheezes. She has no rales. She exhibits no tenderness.  Abdominal: Soft. Bowel sounds are normal. She exhibits no distension and no mass. There is no tenderness. There is no rebound and no guarding.  Musculoskeletal: Normal range of motion. She exhibits no edema or tenderness.  Neurological: She is alert.  Confused, can't remember hospital stay or catheterization Moves all extremities Sensation and strength intact  Skin: Skin is warm and dry.  Psychiatric: She has a normal mood and affect. Her behavior is normal. Judgment and thought content normal.  Nursing note and vitals reviewed.    ED Treatments / Results  Labs (all labs ordered are listed, but only abnormal results are displayed) Labs Reviewed  CBC WITH DIFFERENTIAL/PLATELET - Abnormal; Notable for the following:  Result Value   WBC 12.2 (*)    RBC 3.38 (*)    Hemoglobin 8.9 (*)    HCT 27.6 (*)    Neutro Abs 9.2 (*)    Monocytes Absolute 1.1 (*)    All other components within normal limits  BASIC METABOLIC PANEL - Abnormal; Notable for the following:    Sodium 127 (*)    Potassium 6.9 (*)    Chloride 98 (*)    CO2 18 (*)    Glucose, Bld 491 (*)    BUN 42 (*)    Creatinine, Ser 2.77 (*)    Calcium 8.3 (*)    GFR calc non Af Amer 15 (*)    GFR calc Af Amer 18 (*)    All other components within normal limits  I-STAT TROPOININ, ED - Abnormal; Notable for the following:    Troponin i, poc 3.54 (*)    All other components within normal limits  I-STAT CHEM 8, ED - Abnormal; Notable for the following:    Sodium 127 (*)    Potassium 6.7 (*)    Chloride 98 (*)    BUN 41 (*)    Creatinine, Ser 2.90 (*)    Glucose, Bld 449 (*)    Calcium, Ion 1.02 (*)    Hemoglobin 8.8 (*)    HCT 26.0 (*)    All other components within normal limits  URINALYSIS,  ROUTINE W REFLEX MICROSCOPIC (NOT AT [facility])    EKG  EKG Interpretation  Date/Time:  Thursday September 27 2016 11:27:41 EDT Ventricular Rate:  62 PR Interval:    QRS Duration: 97 QT Interval:  489 QTC Calculation: 497 R Axis:   96 Text Interpretation:  Sinus rhythm Right axis deviation Consider left ventricular hypertrophy Abnrm T, consider ischemia, anterolateral lds inverted Ts resolving Confirmed by Kandis Mannan ([id number]) on 10/02/2016 12:00:58 PM       Radiology Dg Chest 2 View  Result Date: 09/14/2016 CLINICAL DATA:  Mid and left-sided chest pain for couple weeks. History of pneumonia and diabetes. EXAM: CHEST  2 VIEW COMPARISON:  09/22/2016 FINDINGS: Status post median sternotomy and CABG. Heart is enlarged. There are bilateral pleural effusions. Diffuse interstitial markings are most likely related to pulmonary edema. More focal opacity in the medial lung bases could represent edema and/or infectious process. IMPRESSION: Cardiomegaly and changes of pulmonary edema. Bilateral lower lobe edema and/or infection. Electronically Signed   By: Norva Pavlov M.D.   On: 09/26/2016 12:03    Procedures Procedures (including critical care time)  Medications Ordered in ED Medications  calcium gluconate inj 10% (1 g) URGENT USE ONLY! (not administered)  insulin aspart (novoLOG) injection 10 Units (not administered)     Initial Impression / Assessment and Plan / ED Course  I have reviewed the triage vital signs and the nursing notes.  Pertinent labs & imaging results that were available during my care of the patient were reviewed by me and considered in my medical decision making (see chart for details).  Clinical Course    Patient presents with exertional chest pain or shortness breath. Recent cardiac catheterization a couple of days ago following an STEMI during admission for TIA workup. Will check labs and reassess.  Patient noted to be hyperkalemic to 6.9, glucose is just  shy of 500.  Patient see by and discussed with Dr. Corlis Leak, who recommends giving calcium gluconate.  Will also give some insulin.  No peaked t-waves on EKG.  Patient does seem a bit confused  today.  Will check CT head.  Troponin is 3.54, likely still trending down, 16 on 10/17.  Will ask for cards to follow along.  Appreciate [name] and Dr. Konrad Dolores of [facility] who will admit the patient.  Cardiology has been consult that and will see the patient as well.   Hyperkalemia, K 6.9 treated with insulin and calcium. CRITICAL CARE Performed by: Roxy Horseman   Total critical care time: 45 minutes  Critical care time was exclusive of separately billable procedures and treating other patients.  Critical care was necessary to treat or prevent imminent or life-threatening deterioration.  Critical care was time spent personally by me on the following activities: development of treatment plan with patient and/or surrogate as well as nursing, discussions with consultants, evaluation of patient's response to treatment, examination of patient, obtaining history from patient or surrogate, ordering and performing treatments and interventions, ordering and review of laboratory studies, ordering and review of radiographic studies, pulse oximetry and re-evaluation of patient's condition.   Final Clinical Impressions(s) / ED Diagnoses   Final diagnoses:  Hyperkalemia  Chest pain, unspecified type  Hyperglycemia    New Prescriptions New Prescriptions   No medications on file     Roxy Horseman, PA-C 10-10-16 1435    Courteney Lyn Mackuen, MD 10/10/2016 1455

## 2016-09-27 NOTE — ED Notes (Signed)
Checked patient cbg it was [id number] notified RN of blood sugar

## 2016-09-27 NOTE — Consult Note (Signed)
CARDIOLOGY CONSULT NOTE   Patient ID: Gwendolyn Wallace MRN: [id number] DOB/AGE: [birth year] 80 y.o.  Admit date: 09/22/2016  Primary Physician   Dwana MelenaZack Hall, MD Primary Cardiologist   Dr. Duke Salviaandolph (New) Reason for Consultation   Chest pain Requesting Physician  Dr. Konrad DoloresMerrell  HPI: Gwendolyn HeysBessie M Delpilar is a 80 y.o. female with a history of CAD s/p CABG, CKD, HTN, prior stroke, ongoing tobacco abuse, herpes zoster, bilateral carotid stenosis, DM and recent admission for TIA complicated by NSTEMI who discharged yesterday however presented for evaluation of chest pain and SOB.   The patient was admitted on IM service 09/11/16 for TIA. MRI showed mass vs acute stroke. Shedidnot receive IV t-PA due to minimal deficits. Plan was to get outpatient MR of brain with contrast in 6 weeks as outpatient once GFR improves. On 09/24/16 the patient had episode of CP followed by peak of troponin to 13.08 from 1.86 and transferred on cardiology service 09/25/16.  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 It is filling retrograde through her LIMA. They were unable to engage the subclavian stenosis via cardiac catheterization. Plan made for medical management given stroke to allow permissive hypertension to permit retrograde perfusion of the left subclavian. Plan was to evaluated by vascular surgery as outpatient. Imdur added to regimen. Held Lisinopril-HCTZ due to elevated Scr. Advised to close f/u PCP for uncontrolled DM.    Per family, overnight patient had some dyspnea. This morning she suddenly become pale while standing up followed by substernal chest pain leading to ER presentation by EMS. Denies any abdomina pain, nausea, vomiting, dysuria, LE edema.   Workup showed blood glucose of 480 with K of 6.9. Given calcium gluconate. Troponin of 3.54. Trending down from previous admission. EKG showed sinus rhythm at rate of 62 bpm with TVI in leads V3 to V6 which seems improving  from prior EKG of 09/25/16.  CXR showed bilateral edema and/or infection. CT of head without acute abnormality. Scr increased to 2.9 from 2.77.   09/26/16: Her K was 4.6, Scr was 1.91 and blood glucose was 150.  Past Medical History:  Diagnosis Date  . Carotid stenosis, bilateral 08/07/05   bilat 40-60%  . CVA (cerebral infarction) 07/10/02  . Diabetes mellitus without complication (HCC)   . Gastric bleeding 10/01  . Herpes zoster 4/01  . Hypertension      Past Surgical History:  Procedure Laterality Date  . CARDIAC CATHETERIZATION N/A 09/25/2016   Procedure: Left Heart Cath and Coronary Angiography;  Surgeon: Peter M SwazilandJordan, MD;  Location: [facility];  Service: Cardiovascular;  Laterality: N/A;  . CATARACT EXTRACTION, BILATERAL  07/30/2006  . CORONARY ARTERY BYPASS GRAFT  03/11/1999  . PANRETINAL PHOTOCOAGULATION  07/10/2002  . PANRETINAL PHOTOCOAGULATION  4/05    Allergies  Allergen Reactions  . Gemfibrozil Other (See Comments)    MYALGIA  . Trulicity [Dulaglutide] Nausea And Vomiting    EXTREME N & V AND LETHARGY  . Tape Rash    Please use paper tape    I have reviewed the patient's current medications . aspirin EC  325 mg Oral Daily  . atorvastatin  80 mg Oral Daily  . enoxaparin (LOVENOX) injection  30 mg Subcutaneous Q24H  . gi cocktail  30 mL Oral Once  . insulin aspart  0-15 Units Subcutaneous TID WC  . insulin aspart  0-5 Units Subcutaneous QHS  . insulin glargine  10 Units Subcutaneous QHS  . isosorbide mononitrate  30 mg Oral Daily  . metoprolol  50 mg Oral BID  . cyanocobalamin  2,000 mcg Oral Daily   . sodium chloride     acetaminophen, gi cocktail, morphine injection, multivitamin with minerals, ondansetron (ZOFRAN) IV  Prior to Admission medications   Medication Sig Start Date End Date Taking? Authorizing Provider  aspirin EC 325 MG tablet Take 1 tablet (325 mg total) by mouth daily. 09/26/16  Yes Naydene Kamrowski, PA  atorvastatin (LIPITOR) 80  MG tablet Take 1 tablet (80 mg total) by mouth daily. 09/26/16  Yes Madelynne Lasker, PA  calcium carbonate (TUMS E-X 750) 750 MG chewable tablet Chew 1 tablet by mouth 2 (two) times daily as needed for heartburn.    Yes Historical Provider, MD  cyanocobalamin 2000 MCG tablet Take 2,000 mcg by mouth daily.     Yes Zachery Dauer, MD  insulin degludec (TRESIBA FLEXTOUCH) 100 UNIT/ML SOPN FlexTouch Pen Inject 10 Units into the skin daily.   Yes Historical Provider, MD  isosorbide mononitrate (IMDUR) 30 MG 24 hr tablet Take 1 tablet (30 mg total) by mouth daily. 2016/10/06  Yes Alberta Cairns, PA  metFORMIN (GLUCOPHAGE) 1000 MG tablet TAKE ONE AND ONE-HALF TABLET EVERY MORNING AND ONE TABLET AT SUPPER 10/05/13  Yes Uvaldo Rising, MD  metoprolol (LOPRESSOR) 50 MG tablet TAKE ONE TABLET TWICE DAILY Patient taking differently: TAKE ONE TABLET (50 mg) TWICE DAILY 10/02/12  Yes Zachery Dauer, MD  Multiple Vitamin (MULTIVITAMIN) capsule Take 1 capsule by mouth daily as needed (for supplementation).    Yes Historical Provider, MD  pantoprazole (PROTONIX) 20 MG tablet Take 1 tablet (20 mg total) by mouth daily. Patient taking differently: Take 20 mg by mouth daily as needed for heartburn or indigestion.  07/13/16  Yes Donnetta Hutching, MD  nitroGLYCERIN (NITROSTAT) 0.4 MG SL tablet Place 0.4 mg under the tongue every 5 (five) minutes as needed for chest pain.     Historical Provider, MD     Social History   Social History  . Marital status: Widowed    Spouse name: N/A  . Number of children: N/A  . Years of education: N/A   Occupational History  . retired    Social History Main Topics  . Smoking status: Former Smoker    Packs/day: 0.25    Types: Cigarettes    Quit date: 09/22/2016  . Smokeless tobacco: Never Used  . Alcohol use No  . Drug use: No  . Sexual activity: No   Other Topics Concern  . Not on file   Social History Narrative   Second husband, Morris, died   Daughter and her children live  with her    Family Status  Relation Status  . Brother   . Mother   . Son   . Brother   . Daughter   . Daughter   . Father    Family History  Problem Relation Age of Onset  . Stroke Brother   . Diabetes Mother   . Diabetes Son   . Diabetes Brother   . Heart attack Brother   . Cholecystitis Daughter   . Cholecystitis Daughter   . Heart attack Father        ROS:  Full 14 point review of systems complete and found to be negative unless listed above.  Physical Exam: Blood pressure 151/85, pulse 60, temperature 97.7 F (36.5 C), resp. rate 19, height 5' (1.524 m), weight 100 lb 5 oz (45.5 kg), SpO2 100 %.  General: Well  developed, well nourished, female in no acute distress Head: Eyes PERRLA, No xanthomas. Normocephalic and atraumatic, oropharynx without edema or exudate.  Lungs: Resp regular and unlabored, diminished breath sound with ? Faint rales Heart: RRR no s3, s4, or murmurs..   Neck: No carotid bruits. No lymphadenopathy.  No JVD. Abdomen: Bowel sounds present, abdomen soft and non-tender without masses or hernias noted. Msk:  No spine or cva tenderness. No weakness, no joint deformities or effusions. Extremities: No clubbing, cyanosis or edema. DP/PT/Radials 2+ and equal bilaterally. Neuro: Alert and oriented X 3. No focal deficits noted. Psych:  Good affect, responds appropriately Skin: No rashes or lesions noted.  Labs:   Lab Results  Component Value Date   WBC 12.2 (H) 10/09/2016   HGB 8.8 (L) 09/28/2016   HCT 26.0 (L) 09/25/2016   MCV 81.7 09/22/2016   PLT 285 10/05/2016   No results for input(s): INR in the last 72 hours.  Recent Labs Lab 10/06/2016 1134 09/12/2016 1236  NA 127* 127*  K 6.9* 6.7*  CL 98* 98*  CO2 18*  --   BUN 42* 41*  CREATININE 2.77* 2.90*  CALCIUM 8.3*  --   GLUCOSE 491* 449*   No results found for: MG  Recent Labs  09/24/16 1839 09/25/16 0056 09/25/16 0739 09/25/16 1240  TROPONINI 1.86* 5.29* 13.08* 16.60*     Recent Labs  10/09/2016 1148  TROPIPOC 3.54*   No results found for: PROBNP Lab Results  Component Value Date   CHOL 142 09/22/2016   HDL 37 (L) 09/22/2016   LDLCALC 79 09/22/2016   TRIG 131 09/22/2016   No results found for: DDIMER Lipase  Date/Time Value Ref Range Status  07/13/2016 12:50 PM 21 11 - 51 U/L Final   TSH  Date/Time Value Ref Range Status  09/23/2016 04:15 AM 2.980 0.350 - 4.500 uIU/mL Final    Comment:    Performed by a 3rd Generation assay with a functional sensitivity of <=0.01 uIU/mL.  03/31/2010 12:00 AM 3.641 0.350 - 4.500 microintl units/mL Final    Comment:    See lab report for associated comment(s)   Vitamin B-12  Date/Time Value Ref Range Status  09/22/2016 10:13 AM 382 180 - 914 pg/mL Final    Comment:    (NOTE) This assay is not validated for testing neonatal or myeloproliferative syndrome specimens for Vitamin B12 levels.     LHC 09/25/16:  Prox LAD to Mid LAD lesion, 80 %stenosed.  Dist LAD lesion, 40 %stenosed.  Ost Cx to Prox Cx lesion, 100 %stenosed.  Dist RCA lesion, 90 %stenosed.  Ost RPDA to RPDA lesion, 100 %stenosed.  Ost RCA to Mid RCA lesion, 100 %stenosed.  SVG graft was visualized by angiography and is normal in caliber.  SVG graft was visualized by angiography.  Origin lesion, 100 %stenosed.  LIMA and is normal in caliber and anatomically normal.  The flow in the graft is reversed.  1. Severe 3 vessel obstructive CAD 2. Patent SVG to the first OM. This fills the distal LCx 3. Occluded SVG to the RCA. The PDA and PLOM branches fill by left to right collaterals.  4. The LIMA graft is widely patent but fills retrograde from the LAD to the subclavian consistent with severe subclavian steal. 5. Mildly elevated LVEDP  Echo 09/23/16   LV EF: 50% - 55%  ------------------------------------------------------------------- Indications: TIA  435.9.  ------------------------------------------------------------------- History: PMH: Stroke. Risk factors: Hypertension. Diabetes mellitus.  ------------------------------------------------------------------- Study Conclusions  - Left ventricle: The  cavity size was normal. Wall thickness was increased in a pattern of mild LVH. Systolic function was at the lower limits of normal. The estimated ejection fraction was in the range of 50% to 55%. Features are consistent with a pseudonormal left ventricular filling pattern, with concomitant abnormal relaxation and increased filling pressure (grade 2 diastolic dysfunction). Doppler parameters are consistent with high ventricular filling pressure. - Mitral valve: Severely calcified annulus. There was moderate regurgitation. - Left atrium: The atrium was severely dilated. - Right ventricle: Systolic function was moderately reduced. - Atrial septum: No defect or patent foramen ovale was identified. - Tricuspid valve: There was moderate regurgitation. - Pulmonary arteries: PA peak pressure: 47 mm Hg (S).  Radiology:  Dg Chest 2 View  Result Date: 10-Oct-2016 CLINICAL DATA:  Mid and left-sided chest pain for couple weeks. History of pneumonia and diabetes. EXAM: CHEST  2 VIEW COMPARISON:  09/22/2016 FINDINGS: Status post median sternotomy and CABG. Heart is enlarged. There are bilateral pleural effusions. Diffuse interstitial markings are most likely related to pulmonary edema. More focal opacity in the medial lung bases could represent edema and/or infectious process. IMPRESSION: Cardiomegaly and changes of pulmonary edema. Bilateral lower lobe edema and/or infection. Electronically Signed   By: Norva Pavlov M.D.   On: 10-Oct-2016 12:03   Ct Head Wo Contrast  Result Date: Oct 10, 2016 CLINICAL DATA:  Confusion, chair spine EXAM: CT HEAD WITHOUT CONTRAST TECHNIQUE: Contiguous axial images were obtained from the base  of the skull through the vertex without intravenous contrast. COMPARISON:  09/22/2016 FINDINGS: Brain: No intracranial hemorrhage, mass effect or midline shift. Stable atrophy and chronic white matter disease. No definite acute cortical infarction. No mass lesion is noted on this unenhanced scan. Vascular: Atherosclerotic calcifications of carotid siphon. Skull: No skull fracture is noted. Sinuses/Orbits: There is mucosal thickening with partial opacification of left anterior ethmoid air cells. Mucosal thickening with almost complete opacification left maxillary sinus. Mucosal thickening with opacification left frontal sinus. Other: None IMPRESSION: No acute intracranial abnormality. Stable atrophy and chronic white matter disease. Paranasal sinuses disease as described above. Electronically Signed   By: Natasha Mead M.D.   On: 10-10-2016 13:27    ASSESSMENT AND PLAN:     1. NSTEMI/subclavian steal - Cath showed 3 vessel CAD with severe stenosis/occlusion of the proximal left subclavian artery.  It is filling retrograde through her LIMA.  They were unable to engage the subclavian stenosis via cardiac catheterization. Treated medically due to stroke.  - Today presented with chest pain. Troponin trending down from prior admission. EKG actually looks better. Continue ASA, statin, BB and Imdur. Currently chest pain free.   2. Dyspnea with hypoxia - CXR showed bilateral lower lobe edema and/or infection. Minimally elevated WBC. Her EF was 55-60% on echo with grade 2 DD.  She does not seems in HF. Will check BNP.  3. DM - uncontrolled. A1c of 8.6 last admission requiring additional insulin and plan was to close f/u with PCP. However today her blood sugar was 480 with K of 6.9, N of 127 and ion gap of 11.  - Per primary  4. Acute on CKD, stage II - discharge Scr ws 1.91--> Today 2.77 to 2.9. Avoid nephrotoxin agent.   5. Hyperkalemia - K was 4.9 yesterday. Today 6.9. Likely related to # 3. Calcium  bicarbonate and IV insulin given.    SignedManson Passey, PA 2016-10-10, 3:20 PM Pager [phone]  Co-Sign MD

## 2016-09-28 DIAGNOSIS — E871 Hypo-osmolality and hyponatremia: Secondary | ICD-10-CM

## 2016-09-28 DIAGNOSIS — E7800 Pure hypercholesterolemia, unspecified: Secondary | ICD-10-CM

## 2016-09-28 DIAGNOSIS — D649 Anemia, unspecified: Secondary | ICD-10-CM

## 2016-09-28 DIAGNOSIS — I2510 Atherosclerotic heart disease of native coronary artery without angina pectoris: Secondary | ICD-10-CM

## 2016-09-28 DIAGNOSIS — I110 Hypertensive heart disease with heart failure: Secondary | ICD-10-CM

## 2016-09-28 DIAGNOSIS — J811 Chronic pulmonary edema: Secondary | ICD-10-CM

## 2016-09-28 DIAGNOSIS — Z515 Encounter for palliative care: Secondary | ICD-10-CM

## 2016-09-28 DIAGNOSIS — E119 Type 2 diabetes mellitus without complications: Secondary | ICD-10-CM

## 2016-09-28 DIAGNOSIS — N289 Disorder of kidney and ureter, unspecified: Secondary | ICD-10-CM

## 2016-09-28 DIAGNOSIS — Z794 Long term (current) use of insulin: Secondary | ICD-10-CM

## 2016-09-28 DIAGNOSIS — I10 Essential (primary) hypertension: Secondary | ICD-10-CM

## 2016-09-28 LAB — TROPONIN I: Troponin I: 4.01 ng/mL (ref <0.03)

## 2016-09-28 LAB — BASIC METABOLIC PANEL
Anion gap: 13 (ref 5–15)
BUN: 51 mg/dL — AB (ref 6–20)
CHLORIDE: 99 mmol/L — AB (ref 101–111)
CO2: 18 mmol/L — ABNORMAL LOW (ref 22–32)
Calcium: 8.4 mg/dL — ABNORMAL LOW (ref 8.9–10.3)
Creatinine, Ser: 3.59 mg/dL — ABNORMAL HIGH (ref 0.44–1.00)
GFR calc Af Amer: 13 mL/min — ABNORMAL LOW (ref >60)
GFR calc non Af Amer: 11 mL/min — ABNORMAL LOW (ref >60)
Glucose, Bld: 239 mg/dL — ABNORMAL HIGH (ref 65–99)
POTASSIUM: 5.4 mmol/L — AB (ref 3.5–5.1)
SODIUM: 130 mmol/L — AB (ref 135–145)

## 2016-09-28 LAB — CBC
HEMATOCRIT: 27.2 % — AB (ref 36.0–46.0)
HEMOGLOBIN: 8.7 g/dL — AB (ref 12.0–15.0)
MCH: 26.1 pg (ref 26.0–34.0)
MCHC: 32 g/dL (ref 30.0–36.0)
MCV: 81.7 fL (ref 78.0–100.0)
Platelets: 275 10*3/uL (ref 150–400)
RBC: 3.33 MIL/uL — ABNORMAL LOW (ref 3.87–5.11)
RDW: 15 % (ref 11.5–15.5)
WBC: 15.1 10*3/uL — ABNORMAL HIGH (ref 4.0–10.5)

## 2016-09-28 LAB — GLUCOSE, CAPILLARY
GLUCOSE-CAPILLARY: 246 mg/dL — AB (ref 65–99)
GLUCOSE-CAPILLARY: 230 mg/dL — AB (ref 65–99)
GLUCOSE-CAPILLARY: 304 mg/dL — AB (ref 65–99)
GLUCOSE-CAPILLARY: 195 mg/dL — AB (ref 65–99)

## 2016-09-28 MED ORDER — SODIUM POLYSTYRENE SULFONATE 15 GM/60ML PO SUSP
30.0000 g | Freq: Once | ORAL | Status: AC
  Administered 2016-09-28: 30 g via ORAL
  Filled 2016-09-28: qty 120

## 2016-09-28 MED ORDER — FUROSEMIDE 10 MG/ML IJ SOLN
80.0000 mg | Freq: Two times a day (BID) | INTRAMUSCULAR | Status: DC
  Administered 2016-09-28 – 2016-09-29 (×2): 80 mg via INTRAVENOUS
  Filled 2016-09-28 (×2): qty 8

## 2016-09-28 MED ORDER — INFLUENZA VAC SPLIT QUAD 0.5 ML IM SUSY: 0.5000 mL | PREFILLED_SYRINGE | INTRAMUSCULAR | Status: DC

## 2016-09-28 NOTE — Consult Note (Signed)
Consultation Note Date: 09/28/2016   Patient Name: Gwendolyn Wallace  DOB: 05/26/1936  MRN: [id number]  Age / Sex: 80 y.o., female  PCP: Gwendolyn Stabile, MD Referring Physician: Edsel Petrin, DO  Reason for Consultation: Establishing goals of care, Hospice Evaluation and Psychosocial/spiritual support  HPI/Patient Profile: 80 y.o. female  with past medical history of Coronary artery disease status post CABG, hypertension, carotid stenosis, diabetes, stroke, chronic kidney disease stage III, admitted on 10/05/2016 with chest pain and shortness of breath. Patient was just discharged on 09/26/2016 after being admitted for a TIA which was complicated by a non-STEMI. Her blood glucose on admission was 480, her potassium was 6.9. Her troponin was 3.54. Chest x-ray showed bilateral edema and/or infection patient's subclavian stenosis is best managed surgically but she is not a surgical candidate secondary to recent stroke and overall frailty. MRI of the brain showed a mass versus an acute stroke..   Clinical Assessment and Goals of Care: Patient sleeps throughout most of my assessment in conversation with patient's daughter and granddaughter. She is minimally conversant. She is eating only bites and sips. She has lived with her daughter Gwendolyn Wallace, for the past 8 years. She has 5 children. She is asking to go home. Clinically updated family and they do show a good understanding of patient's multisystem frailty secondary to severe heart disease recent CVA, non-STEMI and worsening kidney function.  Patient does not have a designated healthcare power of attorney. She has lived with her daughter Gwendolyn Wallace for the past 8 years. Gwendolyn Wallace reports that she is in conversation with her brothers and sisters. Patient at this point is likely too ill to make complicated decisions and per daughter and granddaughter who were at the bedside Mrs.  Gwendolyn Wallace personality is such that she defers all conversations to her daughter    SUMMARY OF RECOMMENDATIONS   Continue full code for now Palliative medicine planning to meet with daughter again on 09/29/2016 to finalize goals of care Daughter reports she would like to discuss with her brothers DO NOT RESUSCITATE and home with hospice. In the past she and her siblings have always been in agreement with the care of their mother and this is what she would want for her at this point given her advanced illness  Code Status/Advance Care Planning:  Full code    Symptom Management:   Pain: Continue morphine 2 mg IV as needed  Anxiety: Continue Xanax 0.25 twice a day as needed  Palliative Prophylaxis:   Aspiration, Bowel Regimen, Delirium Protocol, Frequent Pain Assessment, Oral Care and Turn Reposition  Additional Recommendations (Limitations, Scope, Preferences):  Minimize Medications, No Artificial Feeding, No Chemotherapy, No Hemodialysis, No Radiation, No Surgical Procedures and No Tracheostomy  Psycho-social/Spiritual:   Desire for further Chaplaincy support:no  Additional Recommendations: Grief/Bereavement Support  Prognosis:   < 6 weeks in the severity of severe right heart failure, chronic atrial fibrillation recent CVA complicated by non-STEMI as well as further stenosis that cannot be managed surgically. Patient is at high risk for an acute  cardiopulmonary event  Discharge Planning: To Be Determined      Primary Diagnoses: Present on Admission: . Absolute anemia . CAD (coronary artery disease) . Carotid artery stenosis . Hyperlipidemia . HYPERTENSION, BENIGN SYSTEMIC . Hypertensive heart disease without heart failure . Chest pain . Hyperkalemia . Acute renal failure superimposed on chronic kidney disease (HCC) . Hyponatremia   I have reviewed the medical record, interviewed the patient and family, and examined the patient. The following aspects are  pertinent.  Past Medical History:  Diagnosis Date  . Carotid stenosis, bilateral 08/07/05   bilat 40-60%  . CVA (cerebral infarction) 07/10/02  . Diabetes mellitus without complication (HCC)   . Gastric bleeding 10/01  . Herpes zoster 4/01  . Hypertension    Social History   Social History  . Marital status: Widowed    Spouse name: N/A  . Number of children: N/A  . Years of education: N/A   Occupational History  . retired    Social History Main Topics  . Smoking status: Former Smoker    Packs/day: 0.25    Types: Cigarettes    Quit date: 09/22/2016  . Smokeless tobacco: Never Used  . Alcohol use No  . Drug use: No  . Sexual activity: No   Other Topics Concern  . None   Social History Narrative   Second husband, Gwendolyn Wallace, died   Daughter and her children live with her   Family History  Problem Relation Age of Onset  . Stroke Brother   . Diabetes Mother   . Diabetes Son   . Diabetes Brother   . Heart attack Brother   . Cholecystitis Daughter   . Cholecystitis Daughter   . Heart attack Father    Scheduled Meds: . aspirin EC  325 mg Oral Daily  . atorvastatin  80 mg Oral Daily  . enoxaparin (LOVENOX) injection  30 mg Subcutaneous Q24H  . [START ON 09/29/2016] Influenza vac split quadrivalent PF  0.5 mL Intramuscular Tomorrow-1000  . insulin aspart  0-15 Units Subcutaneous TID WC  . insulin aspart  0-5 Units Subcutaneous QHS  . insulin glargine  10 Units Subcutaneous QHS  . isosorbide mononitrate  30 mg Oral Daily  . metoprolol  50 mg Oral BID  . ranolazine  500 mg Oral BID  . cyanocobalamin  2,000 mcg Oral Daily   Continuous Infusions:  PRN Meds:.acetaminophen, ALPRAZolam, gi cocktail, morphine injection, multivitamin with minerals, ondansetron (ZOFRAN) IV Medications Prior to Admission:  Prior to Admission medications   Medication Sig Start Date End Date Taking? Authorizing Provider  aspirin EC 325 MG tablet Take 1 tablet (325 mg total) by mouth daily.  09/26/16  Yes Bhavinkumar Bhagat, PA  atorvastatin (LIPITOR) 80 MG tablet Take 1 tablet (80 mg total) by mouth daily. 09/26/16  Yes Bhavinkumar Bhagat, PA  calcium carbonate (TUMS E-X 750) 750 MG chewable tablet Chew 1 tablet by mouth 2 (two) times daily as needed for heartburn.    Yes Historical Provider, MD  cyanocobalamin 2000 MCG tablet Take 2,000 mcg by mouth daily.     Yes Zachery Dauer, MD  insulin degludec (TRESIBA FLEXTOUCH) 100 UNIT/ML SOPN FlexTouch Pen Inject 10 Units into the skin daily.   Yes Historical Provider, MD  isosorbide mononitrate (IMDUR) 30 MG 24 hr tablet Take 1 tablet (30 mg total) by mouth daily. 09/12/2016  Yes Bhavinkumar Bhagat, PA  metFORMIN (GLUCOPHAGE) 1000 MG tablet TAKE ONE AND ONE-HALF TABLET EVERY MORNING AND ONE TABLET AT SUPPER 10/05/13  Yes  Uvaldo Rising, MD  metoprolol (LOPRESSOR) 50 MG tablet TAKE ONE TABLET TWICE DAILY Patient taking differently: TAKE ONE TABLET (50 mg) TWICE DAILY 10/02/12  Yes Zachery Dauer, MD  Multiple Vitamin (MULTIVITAMIN) capsule Take 1 capsule by mouth daily as needed (for supplementation).    Yes Historical Provider, MD  pantoprazole (PROTONIX) 20 MG tablet Take 1 tablet (20 mg total) by mouth daily. Patient taking differently: Take 20 mg by mouth daily as needed for heartburn or indigestion.  07/13/16  Yes Donnetta Hutching, MD  nitroGLYCERIN (NITROSTAT) 0.4 MG SL tablet Place 0.4 mg under the tongue every 5 (five) minutes as needed for chest pain.     Historical Provider, MD   Allergies  Allergen Reactions  . Gemfibrozil Other (See Comments)    MYALGIA  . Trulicity [Dulaglutide] Nausea And Vomiting    EXTREME N & V AND LETHARGY  . Tape Rash    Please use paper tape   Review of Systems  All other systems reviewed and are negative.   Physical Exam  Constitutional:  Acutely ill appearing female  HENT:  Head: Normocephalic and atraumatic.  Neck: Normal range of motion.  Pulmonary/Chest: Effort normal.  Neurological:  somnalent   Skin: Skin is warm and dry.  Nursing note and vitals reviewed.   Vital Signs: BP 139/60 (BP Location: Right Arm)   Pulse 63   Temp 99.6 F (37.6 C) (Oral)   Resp 20   Ht 5' (1.524 m)   Wt 46.9 kg (103 lb 4.8 oz) Comment: scale c  SpO2 98%   BMI 20.17 kg/m  Pain Assessment: No/denies pain   Pain Score: 0-No pain   SpO2: SpO2: 98 % O2 Device:SpO2: 98 % O2 Flow Rate: .O2 Flow Rate (L/min): 2 L/min  IO: Intake/output summary:  Intake/Output Summary (Last 24 hours) at 09/28/16 1251 Last data filed at 09/28/16 1055  Gross per 24 hour  Intake          1029.33 ml  Output              100 ml  Net           929.33 ml    LBM: Last BM Date: 09/29/2016 Baseline Weight: Weight: 45.5 kg (100 lb 5 oz) Most recent weight: Weight: 46.9 kg (103 lb 4.8 oz) (scale c)     Palliative Assessment/Data:   Flowsheet Rows   Flowsheet Row Most Recent Value  Intake Tab  Referral Department  Hospitalist  Unit at Time of Referral  Med/Surg Unit  Palliative Care Primary Diagnosis  Cardiac  Date Notified  10/02/2016  Palliative Care Type  New Palliative care  Reason for referral  Clarify Goals of Care, Counsel Regarding Hospice, Psychosocial or Spiritual support  Date first seen by Palliative Care  09/28/16  # of days Palliative referral response time  1 Day(s)  Clinical Assessment  Palliative Performance Scale Score  40%  Pain Max last 24 hours  Not able to report  Pain Min Last 24 hours  Not able to report  Dyspnea Max Last 24 Hours  Not able to report  Dyspnea Min Last 24 hours  Not able to report  Nausea Max Last 24 Hours  Not able to report  Nausea Min Last 24 Hours  Not able to report  Anxiety Max Last 24 Hours  Not able to report  Anxiety Min Last 24 Hours  Not able to report  Other Max Last 24 Hours  Not able to report  Psychosocial & Spiritual Assessment  Palliative Care Outcomes  Patient/Family meeting held?  Yes  Who was at the meeting?  pt, dtr, granddaughter  Palliative Care  Outcomes  Clarified goals of care, Provided psychosocial or spiritual support, Counseled regarding hospice  Patient/Family wishes: Interventions discontinued/not started   Hemodialysis, Mechanical Ventilation, Vasopressors, Trach, PEG  Palliative Care follow-up planned  Yes, Facility      Time In: 1200 Time Out: 1300 Time Total: 60 min Greater than 50%  of this time was spent counseling and coordinating care related to the above assessment and plan. Staffed with Dr. Catha GosselinMikhail Signed by: Irean HongSarah Grace Daniela Siebers, NP   Please contact Palliative Medicine Team phone at [phone] for questions and concerns.  For individual provider: See Loretha StaplerAmion

## 2016-09-28 NOTE — Progress Notes (Signed)
PROGRESS NOTE    Gwendolyn Wallace  ZOX:[id number] DOB: 02-Feb-1936 DOA: 09/17/2016 PCP: Gwendolyn Melena, MD   Chief Complaint  Patient presents with  . Chest Pain  . Shortness of Breath    Brief Narrative:  HPI on 09/10/2016 by Ms. Gwendolyn Smothers, NP Gwendolyn Wallace is a 80 y.o. female with medical history significant for recent any status post cardiac cath with severe 3 vessel obstructive disease, diabetes, hypertension, anemia, hyperlipidemia, chronic kidney disease since emergency department with the chief complaint shortness of breath and chest pain and uncontrolled diabetes hyperkalemia hyponatremia worsening chronic kidney disease.  Information is obtained from the patient and her daughter with him she resides. Patient was discharged yesterday after NSTEMI status post cath by cardiology. Daughter reports she was discharged and had generalized weakness and home health PT was recommended. Last evening when" okay". She ate very little but  Did drink protein shakes. She was short of breath with exertion but recovered quickly. This morning patient awakened around 6 AM complaining of worsening shortness of breath and chest pain.. Daughter reports she was pale and diaphoretic and short of breath. Patient did not want 911 to be called. She calm down a went back to sleep. He describes the pain as an ache located left anterior nonradiating somewhat in her back. When she awakened the second time she remained somewhat pale and short of breath and patient reports the pain in her back was worse. 911 was called she was transported to the emergency department. Patient is on insulin but does not check her blood sugar. He denies headache dizziness abdominal pain nausea vomiting dysuria hematuria frequency or urgency. He denies lower extremity edema but does describe some orthopnea. Assessment & Plan   Chest pain/NSTEMI/Subclavian Steal -Recently admitted and had cardiac catheterization showing severe three-vessel  disease. Discharged on 09/26/2016. -Cardiology consult is appreciated -Patient noted to have subclavian stenosis but was unable to engage subclavian artery during cardiac catheterization -Patient also had CVA on recent admission. Therefore she will be medically treated. -Patient started on Ranexa by cardiology. -Continue statin, Imdur, aspirin, and metoprolol  Acute respiratory failure with hypoxia/dyspnea -Chest x-ray with pulmonary edema and/or infectious process.  -Mild leukocytosis, afebrile  -Positive multifactorial including CHF, possible pneumonia, recent NSTEMI  Acute diastolic heart failure -Echocardiogram showed EF of 55-60% with grade 2 diastolic dysfunction -currently not on Lasix given her kidney failure -Cardiology started IV Lasix today -Patient has not had good urine output since admission -Continue to monitor daily weights, intake and output  Diabetes mellitus, type II, uncontrolled -Hemoglobin A1c 8.6 -Metformin held -Patient has been noted to be noncompliant with medications as well as diet -Continue Lantus, insulin sliding scale, CBG monitoring  Acute on chronic kidney disease, stage II -Despite holding diuretics, patient's creatininecontinues to worsen, currently 3.59  -Patient has had poor urine output -Lasix ordered by cardiology -Patient unlikely to be a good candidate for dialysis if needed -Have consulted palliative care -Pending results of palliative care meeting on 09/29/2016, may consult nephrology  Electrolyte abnormalities, hyperkalemia, hyponatremia -Likely secondary to combination of kidney disease as well as diabetes -Will give Kayexalate for hyperkalemia. Patient has artery received IV insulin and calcium. -Continue to monitor BMP  Essential Hypertension -Continue metoprolol, Lasix  Chronic anemia -Patient close to baseline hemoglobin, currently 8.7 -No evidence of active bleeding -Continue to monitor CBC   Goals of care -Given  patient's comorbidities and current situation, including recent NSTEMI, CVA, worsening renal function, respiratory status, feel that patient's overall  prognosis is poor. Have discussed this with the granddaughter at bedside. Have urged granddaughter to speak to patient's sons and daughters regarding her CODE STATUS and possible quality of life, goals of care. -Palliative care consulted appreciated.  DVT Prophylaxis  lovenox  Code Status: Full  Family Communication: Granddaughter at bedside  Disposition Plan: Admitted, continue treatment. Pending palliative care consult  Consultants Cardiology Palliative care  Procedures  None  Antibiotics   Anti-infectives    None      Subjective:   Gwendolyn Wallace seen and examined today.  Patient very sleepy today. Does feel her breathing has improved. Denies any current chest pain states it is intermittent. Denies any abdominal pain, nausea or vomiting, diarrhea or constipation.  Objective:   Vitals:   09/28/16 0036 09/28/16 0455 09/28/16 1032 09/28/16 1150  BP: (!) 126/57 (!) 147/52 (!) 136/57 139/60  Pulse:  73 61 63  Resp:  18  20  Temp:  98 F (36.7 C)  99.6 F (37.6 C)  TempSrc:  Oral  Oral  SpO2:  100%  98%  Weight:  46.9 kg (103 lb 4.8 oz)    Height:        Intake/Output Summary (Last 24 hours) at 09/28/16 1349 Last data filed at 09/28/16 1055  Gross per 24 hour  Intake          1029.33 ml  Output              100 ml  Net           929.33 ml   Filed Weights   09/15/2016 1131 10/03/2016 1518 09/28/16 0455  Weight: 45.5 kg (100 lb 5 oz) 47.1 kg (103 lb 14.4 oz) 46.9 kg (103 lb 4.8 oz)    Exam  General: Well developed, Chronically ill, elderly lady, no apparent distress  HEENT: NCAT, mucous membranes moist.   Cardiovascular: S1 S2 auscultated, no rubs, murmurs or gallops. Regular rate and rhythm.  Respiratory: Clear to auscultation bilaterally with equal chest rise  Abdomen: Soft, nontender, nondistended, + bowel  sounds  Extremities: warm dry without cyanosis clubbing or edema  Neuro: AAOx3, Slow to respond otherwise nonfocal, follows commands appropriately  Psych: Normal affect and demeanor    Data Reviewed: I have personally reviewed following labs and imaging studies  CBC:  Recent Labs Lab 09/23/16 0415 09/25/16 0739 09/25/16 1720 09/26/16 0255 09/15/2016 1134 10/08/2016 1236 09/28/16 0834  WBC 13.8* 11.4*  --  10.1 12.2*  --  15.1*  NEUTROABS  --   --   --   --  9.2*  --   --   HGB 10.3* 9.4* 9.5* 8.8* 8.9* 8.8* 8.7*  HCT 32.5* 29.5* 28.0* 27.9* 27.6* 26.0* 27.2*  MCV 84.9 82.9  --  82.5 81.7  --  81.7  PLT 305 349  --  292 285  --  275   Basic Metabolic Panel:  Recent Labs Lab 09/24/16 0338  09/26/16 0255 09/21/2016 1134 10/07/2016 1236 09/20/2016 1822 09/28/16 0834  NA 139  < > 134* 127* 127* 129* 130*  K 3.8  < > 4.6 6.9* 6.7* 5.6* 5.4*  CL 108  < > 106 98* 98* 99* 99*  CO2 23  --  20* 18*  --  17* 18*  GLUCOSE 146*  < > 150* 491* 449* 327* 239*  BUN 18  < > 22* 42* 41* 43* 51*  CREATININE 1.60*  < > 1.91* 2.77* 2.90* 2.98* 3.59*  CALCIUM 8.3*  --  8.4*  8.3*  --  8.6* 8.4*  < > = values in this interval not displayed. GFR: Estimated Creatinine Clearance: 9.3 mL/min (by C-G formula based on SCr of 3.59 mg/dL (H)). Liver Function Tests: No results for input(s): AST, ALT, ALKPHOS, BILITOT, PROT, ALBUMIN in the last 168 hours. No results for input(s): LIPASE, AMYLASE in the last 168 hours. No results for input(s): AMMONIA in the last 168 hours. Coagulation Profile:  Recent Labs Lab 09/22/16 0310  INR 1.05   Cardiac Enzymes:  Recent Labs Lab 09/25/16 0056 09/25/16 0739 09/25/16 1240 10/03/2016 1822 09/28/16 0834  TROPONINI 5.29* 13.08* 16.60* 3.65* 4.01*   BNP (last 3 results) No results for input(s): PROBNP in the last 8760 hours. HbA1C: No results for input(s): HGBA1C in the last 72 hours. CBG:  Recent Labs Lab 09/21/2016 1505 09/26/2016 1700 09/17/2016 2212  09/28/16 0649 09/28/16 1147  GLUCAP 327* 299* 313* 246* 230*   Lipid Profile: No results for input(s): CHOL, HDL, LDLCALC, TRIG, CHOLHDL, LDLDIRECT in the last 72 hours. Thyroid Function Tests: No results for input(s): TSH, T4TOTAL, FREET4, T3FREE, THYROIDAB in the last 72 hours. Anemia Panel: No results for input(s): VITAMINB12, FOLATE, FERRITIN, TIBC, IRON, RETICCTPCT in the last 72 hours. Urine analysis:    Component Value Date/Time   COLORURINE YELLOW 10/05/2016 1337   APPEARANCEUR HAZY (A) 09/17/2016 1337   LABSPEC 1.027 09/26/2016 1337   PHURINE 5.5 09/22/2016 1337   GLUCOSEU 500 (A) 10/04/2016 1337   HGBUR SMALL (A) 09/23/2016 1337   BILIRUBINUR NEGATIVE 09/23/2016 1337   KETONESUR NEGATIVE 09/21/2016 1337   PROTEINUR >300 (A) 09/16/2016 1337   NITRITE NEGATIVE 09/21/2016 1337   LEUKOCYTESUR SMALL (A) 09/26/2016 1337   Sepsis Labs: @LABRCNTIP (procalcitonin:4,lacticidven:4)  ) Recent Results (from the past 240 hour(s))  MRSA PCR Screening     Status: None   Collection Time: 09/25/16  2:39 AM  Result Value Ref Range Status   MRSA by PCR NEGATIVE NEGATIVE Final    Comment:        The GeneXpert MRSA Assay (FDA approved for NASAL specimens only), is one component of a comprehensive MRSA colonization surveillance program. It is not intended to diagnose MRSA infection nor to guide or monitor treatment for MRSA infections.       Radiology Studies: Dg Chest 2 View  Result Date: 09/11/2016 CLINICAL DATA:  Mid and left-sided chest pain for couple weeks. History of pneumonia and diabetes. EXAM: CHEST  2 VIEW COMPARISON:  09/22/2016 FINDINGS: Status post median sternotomy and CABG. Heart is enlarged. There are bilateral pleural effusions. Diffuse interstitial markings are most likely related to pulmonary edema. More focal opacity in the medial lung bases could represent edema and/or infectious process. IMPRESSION: Cardiomegaly and changes of pulmonary edema. Bilateral  lower lobe edema and/or infection. Electronically Signed   By: Norva PavlovElizabeth  Brown M.D.   On: 09/26/2016 12:03   Ct Head Wo Contrast  Result Date: 09/25/2016 CLINICAL DATA:  Confusion, chair spine EXAM: CT HEAD WITHOUT CONTRAST TECHNIQUE: Contiguous axial images were obtained from the base of the skull through the vertex without intravenous contrast. COMPARISON:  09/22/2016 FINDINGS: Brain: No intracranial hemorrhage, mass effect or midline shift. Stable atrophy and chronic white matter disease. No definite acute cortical infarction. No mass lesion is noted on this unenhanced scan. Vascular: Atherosclerotic calcifications of carotid siphon. Skull: No skull fracture is noted. Sinuses/Orbits: There is mucosal thickening with partial opacification of left anterior ethmoid air cells. Mucosal thickening with almost complete opacification left maxillary sinus. Mucosal thickening  with opacification left frontal sinus. Other: None IMPRESSION: No acute intracranial abnormality. Stable atrophy and chronic white matter disease. Paranasal sinuses disease as described above. Electronically Signed   By: Natasha Mead M.D.   On: 09/28/2016 13:27     Scheduled Meds: . aspirin EC  325 mg Oral Daily  . atorvastatin  80 mg Oral Daily  . enoxaparin (LOVENOX) injection  30 mg Subcutaneous Q24H  . furosemide  80 mg Intravenous BID  . [START ON 09/29/2016] Influenza vac split quadrivalent PF  0.5 mL Intramuscular Tomorrow-1000  . insulin aspart  0-15 Units Subcutaneous TID WC  . insulin aspart  0-5 Units Subcutaneous QHS  . insulin glargine  10 Units Subcutaneous QHS  . isosorbide mononitrate  30 mg Oral Daily  . metoprolol  50 mg Oral BID  . ranolazine  500 mg Oral BID  . cyanocobalamin  2,000 mcg Oral Daily   Continuous Infusions:    LOS: 1 day   Time Spent in minutes   30 minutes  Jermond Burkemper D.O. on 09/28/2016 at 1:49 PM  Between 7am to 7pm - Pager - [phone]  After 7pm go to [email or web address] -  password [email or web address]  And look for the night coverage person covering for me after hours  [facility] Office  [phone]

## 2016-09-28 NOTE — Progress Notes (Signed)
Patient Name: Gwendolyn HeysBessie M Mikels Date of Encounter: 09/28/2016  Primary Cardiologist: Dr. Williemae Areaandolph  Hospital Problem List     Principal Problem:   Chest pain Active Problems:   Insulin dependent diabetes mellitus (HCC)   Hyperlipidemia   Absolute anemia   HYPERTENSION, BENIGN SYSTEMIC   CAD (coronary artery disease)   Carotid artery stenosis   Kidney disease   Hypertensive heart disease without heart failure   Hyperkalemia   Acute renal failure superimposed on chronic kidney disease (HCC)   Hyponatremia   Hyperglycemia     Subjective   Denies chest pain or SOB but having to sit up in bed to breathe  Inpatient Medications    Scheduled Meds: . aspirin EC  325 mg Oral Daily  . atorvastatin  80 mg Oral Daily  . enoxaparin (LOVENOX) injection  30 mg Subcutaneous Q24H  . [START ON 09/29/2016] Influenza vac split quadrivalent PF  0.5 mL Intramuscular Tomorrow-1000  . insulin aspart  0-15 Units Subcutaneous TID WC  . insulin aspart  0-5 Units Subcutaneous QHS  . insulin glargine  10 Units Subcutaneous QHS  . isosorbide mononitrate  30 mg Oral Daily  . metoprolol  50 mg Oral BID  . ranolazine  500 mg Oral BID  . cyanocobalamin  2,000 mcg Oral Daily   Continuous Infusions:   PRN Meds: acetaminophen, ALPRAZolam, gi cocktail, morphine injection, multivitamin with minerals, ondansetron (ZOFRAN) IV   Vital Signs    Vitals:   10/02/2016 2216 09/28/16 0025 09/28/16 0036 09/28/16 0455  BP: (!) 150/68  (!) 126/57 (!) 147/52  Pulse: 66 61  73  Resp:    18  Temp:  98.3 F (36.8 C)  98 F (36.7 C)  TempSrc:  Oral  Oral  SpO2:  98%  100%  Weight:    103 lb 4.8 oz (46.9 kg)  Height:        Intake/Output Summary (Last 24 hours) at 09/28/16 1022 Last data filed at 09/28/16 0900  Gross per 24 hour  Intake           909.33 ml  Output              100 ml  Net           809.33 ml   Filed Weights   10/09/2016 1131 09/30/2016 1518 09/28/16 0455  Weight: 100 lb 5 oz (45.5 kg)  103 lb 14.4 oz (47.1 kg) 103 lb 4.8 oz (46.9 kg)    Physical Exam   GEN: Frail female in no acute distress but sluggish to respond.  HEENT: Grossly normal.  Neck: Supple, + JVD Cardiac: RRR, no murmurs, rubs, or gallops. No clubbing, cyanosis, edema.  Radials/DP/PT 2+ and equal bilaterally.  Respiratory:  Respirations regular and unlabored, but with rales bilateral, no wheezes or rhonchi  SP02 is normal GI: Soft, nontender, nondistended, BS + x 4. MS: no deformity or atrophy. Skin: warm and dry,brisk capillary refill Neuro:  AAOx3, slow to respond to questions , follows commands, + facial symmetry Psych: .  Normal affect though slow this AM her family states she is slow to waken at times.  Labs    CBC  Recent Labs  10/05/2016 1134 10/07/2016 1236 09/28/16 0834  WBC 12.2*  --  15.1*  NEUTROABS 9.2*  --   --   HGB 8.9* 8.8* 8.7*  HCT 27.6* 26.0* 27.2*  MCV 81.7  --  81.7  PLT 285  --  275   Basic Metabolic Panel  Recent Labs  10-18-2016 1822 09/28/16 0834  NA 129* 130*  K 5.6* 5.4*  CL 99* 99*  CO2 17* 18*  GLUCOSE 327* 239*  BUN 43* 51*  CREATININE 2.98* 3.59*  CALCIUM 8.6* 8.4*   Liver Function Tests No results for input(s): AST, ALT, ALKPHOS, BILITOT, PROT, ALBUMIN in the last 72 hours. No results for input(s): LIPASE, AMYLASE in the last 72 hours. Cardiac Enzymes  Recent Labs  09/25/16 1240 10-18-2016 1822 09/28/16 0834  TROPONINI 16.60* 3.65* 4.01*   BNP Invalid input(s): POCBNP D-Dimer No results for input(s): DDIMER in the last 72 hours. Hemoglobin A1C No results for input(s): HGBA1C in the last 72 hours. Fasting Lipid Panel No results for input(s): CHOL, HDL, LDLCALC, TRIG, CHOLHDL, LDLDIRECT in the last 72 hours. Thyroid Function Tests No results for input(s): TSH, T4TOTAL, T3FREE, THYROIDAB in the last 72 hours.  Invalid input(s): FREET3  Telemetry    SR - Personally Reviewed  ECG    SR  To SB at 59 lateral ischemia but no changes from  previous. - Personally Reviewed  Radiology    Dg Chest 2 View  Result Date: 10-18-16 CLINICAL DATA:  Mid and left-sided chest pain for couple weeks. History of pneumonia and diabetes. EXAM: CHEST  2 VIEW COMPARISON:  09/22/2016 FINDINGS: Status post median sternotomy and CABG. Heart is enlarged. There are bilateral pleural effusions. Diffuse interstitial markings are most likely related to pulmonary edema. More focal opacity in the medial lung bases could represent edema and/or infectious process. IMPRESSION: Cardiomegaly and changes of pulmonary edema. Bilateral lower lobe edema and/or infection. Electronically Signed   By: Norva Pavlov M.D.   On: Oct 18, 2016 12:03   Ct Head Wo Contrast  Result Date: 2016/10/18 CLINICAL DATA:  Confusion, chair spine EXAM: CT HEAD WITHOUT CONTRAST TECHNIQUE: Contiguous axial images were obtained from the base of the skull through the vertex without intravenous contrast. COMPARISON:  09/22/2016 FINDINGS: Brain: No intracranial hemorrhage, mass effect or midline shift. Stable atrophy and chronic white matter disease. No definite acute cortical infarction. No mass lesion is noted on this unenhanced scan. Vascular: Atherosclerotic calcifications of carotid siphon. Skull: No skull fracture is noted. Sinuses/Orbits: There is mucosal thickening with partial opacification of left anterior ethmoid air cells. Mucosal thickening with almost complete opacification left maxillary sinus. Mucosal thickening with opacification left frontal sinus. Other: None IMPRESSION: No acute intracranial abnormality. Stable atrophy and chronic white matter disease. Paranasal sinuses disease as described above. Electronically Signed   By: Natasha Mead M.D.   On: 10-18-16 13:27    Cardiac Studies   None this admit had previous cath angina from subclavian steal  Patient Profile     80 y.o. female with a history of CAD s/p CABG, CKD, HTN, prior stroke, ongoing tobacco abuse, herpes zoster,  bilateral carotid stenosis, DM and recent admission for CVA versus brain mass  complicated by NSTEMI who discharged 09/26/16 and presented for evaluation of chest pain and SOB on 2016-10-18. Cath 09/25/17 revealed severe, three vessel CAD with 80% LAD stenosis, 100% RCA, 100% RPDA, and 100% SVG to RCA.  The SVG to the OM was patent and filled the distal LCx.  Her LIMA was patent but filled retrograde due to left subclavian stenosis.  The RCA fills via left to right collaterals.   Basically, her entire right and left system as well as her left arm are supplied via the SVG to the OM.    Assessment & Plan    1. NSTEMI/subclavian  steal - Cath showed 3 vessel CAD with severe stenosis/occlusion of the proximal left subclavian artery. It is filling retrograde through her LIMA. They were unable to engage the subclavian stenosis via cardiac catheterization. Treated medically due to stroke.  - now presented with recurrent chest pain. Troponin trending up now at 3.54, 3.65 and today 4.01 but down from prior admission. EKG stable. Continue ASA, statin, BB and Imdur. ranexa added yesterday.  Currently chest pain free. ? Repeat echo limited to check EF since MI- she had mildly elevated LVEDP at time of cath.    2. Dyspnea with hypoxia - CXR showed bilateral lower lobe edema and/or infection. Minimally elevated WBC. Her EF was 55-60% on echo with grade 2 DD.  She does not seems in HF.  BNP > 4500.+ rales and increaseing Cr. Needs lasix and ? Renal consult. + I&O of 809 since admit   3. DM - uncontrolled. A1c of 8.6 last admission requiring additional insulin and plan was to close f/u with PCP. However today her blood sugar was 480 with K of 6.9, N of 127 and ion gap of 11.  - Per primary  4. Acute renal failure  on CKD, stage II with recent cath - discharge Scr ws 1.91--> Today 2.77 to 2.9 now 3.59. Avoid nephrotoxin agent.   5. Hyperkalemia - K was 4.9 then 6.9. Likely related to # 3. Calcium bicarbonate  and IV insulin given. Today 5.4   Signed, Nada Boozer, NP  09/28/2016, 10:22 AM

## 2016-09-28 NOTE — Progress Notes (Signed)
Patients troponin 4.01. MD notified. Patient resting, no complaints at this time.  Gable Odonohue, RN

## 2016-09-28 NOTE — Progress Notes (Signed)
Pt. Has not void during the shift even after Furosemide was administered. Bladder scanner was done and showed 239 ml. Paged doctor Catha GosselinMikhail for notification. Advised to keep monitor on pt.  Zavia Pullen,RN

## 2016-09-28 NOTE — Progress Notes (Signed)
Inpatient Diabetes Program Recommendations  AACE/ADA: New Consensus Statement on Inpatient Glycemic Control (2015)  Target Ranges:  Prepandial:   less than 140 mg/dL      Peak postprandial:   less than 180 mg/dL (1-2 hours)      Critically ill patients:  140 - 180 mg/dL   Lab Results  Component Value Date   GLUCAP 246 (H) 09/28/2016   HGBA1C 8.6 (H) 09/22/2016    Review of Glycemic Control  Diabetes history: DM 2 Outpatient Diabetes medications: Tresiba 10 Daily, Metformin 500 mg QAM, 1000 mg QPM Current orders for Inpatient glycemic control: Lantus 10 units, Novolog Moderate + HS scale  Inpatient Diabetes Program Recommendations:   Due to renal function, Please consider decreasing correction scale to Novolog Sensitive and increase basal insulin to Lantus 15 units. Glucose trends 246-411 mg/dl.   Thanks,  Christena DeemShannon Promiss Labarbera RN, MSN, [facility] Inpatient Diabetes Coordinator Team Pager [phone] (8a-5p)

## 2016-09-29 LAB — CBC
HEMATOCRIT: 24.8 % — AB (ref 36.0–46.0)
Hemoglobin: 8.1 g/dL — ABNORMAL LOW (ref 12.0–15.0)
MCH: 26.4 pg (ref 26.0–34.0)
MCHC: 32.7 g/dL (ref 30.0–36.0)
MCV: 80.8 fL (ref 78.0–100.0)
Platelets: 249 10*3/uL (ref 150–400)
RBC: 3.07 MIL/uL — ABNORMAL LOW (ref 3.87–5.11)
RDW: 15.4 % (ref 11.5–15.5)
WBC: 15.9 10*3/uL — ABNORMAL HIGH (ref 4.0–10.5)

## 2016-09-29 LAB — BASIC METABOLIC PANEL
Anion gap: 10 (ref 5–15)
BUN: 55 mg/dL — AB (ref 6–20)
CALCIUM: 7.6 mg/dL — AB (ref 8.9–10.3)
CO2: 22 mmol/L (ref 22–32)
CREATININE: 3.95 mg/dL — AB (ref 0.44–1.00)
Chloride: 98 mmol/L — ABNORMAL LOW (ref 101–111)
GFR calc Af Amer: 12 mL/min — ABNORMAL LOW (ref >60)
GFR calc non Af Amer: 10 mL/min — ABNORMAL LOW (ref >60)
GLUCOSE: 185 mg/dL — AB (ref 65–99)
Potassium: 4.5 mmol/L (ref 3.5–5.1)
Sodium: 130 mmol/L — ABNORMAL LOW (ref 135–145)

## 2016-09-29 LAB — GLUCOSE, CAPILLARY
GLUCOSE-CAPILLARY: 174 mg/dL — AB (ref 65–99)
GLUCOSE-CAPILLARY: 215 mg/dL — AB (ref 65–99)
Glucose-Capillary: 154 mg/dL — ABNORMAL HIGH (ref 65–99)
Glucose-Capillary: 237 mg/dL — ABNORMAL HIGH (ref 65–99)

## 2016-09-29 MED ORDER — FUROSEMIDE 10 MG/ML IJ SOLN
80.0000 mg | Freq: Every day | INTRAMUSCULAR | Status: DC
  Administered 2016-09-30: 80 mg via INTRAVENOUS
  Filled 2016-09-29: qty 8

## 2016-09-29 MED ORDER — MORPHINE SULFATE (PF) 2 MG/ML IV SOLN
1.0000 mg | INTRAVENOUS | Status: DC | PRN
  Administered 2016-09-29 – 2016-09-30 (×4): 2 mg via INTRAVENOUS
  Filled 2016-09-29 (×4): qty 1

## 2016-09-29 NOTE — Progress Notes (Signed)
Daily Progress Note   Patient Name: Gwendolyn Wallace       Date: 09/29/2016 DOB: 1936-05-14  Age: 80 y.o. MRN#: [id number] Attending Physician: Cristal Ford, DO Primary Care Physician: Wende Neighbors, MD Admit Date: 09/22/2016  Reason for Consultation/Follow-up: Establishing goals of care, Hospice Evaluation, Inpatient hospice referral, Non pain symptom management, Pain control and Psychosocial/spiritual support  Subjective: Patient continues to have a fluctuating level of consciousness. She sometimes is able to express basic needs an amp answer simple questions. She is eating only bites and sips. She is not having any urine output and her creatinine continues to rise. Creatinine today is 3.95. She is no longer able to ambulate.  Length of Stay: 2  Current Medications: Scheduled Meds:  . aspirin EC  325 mg Oral Daily  . enoxaparin (LOVENOX) injection  30 mg Subcutaneous Q24H  . [START ON 09/30/2016] furosemide  80 mg Intravenous Daily  . Influenza vac split quadrivalent PF  0.5 mL Intramuscular Tomorrow-1000  . insulin aspart  0-15 Units Subcutaneous TID WC  . insulin aspart  0-5 Units Subcutaneous QHS  . insulin glargine  10 Units Subcutaneous QHS  . isosorbide mononitrate  30 mg Oral Daily  . metoprolol  50 mg Oral BID  . ranolazine  500 mg Oral BID  . cyanocobalamin  2,000 mcg Oral Daily    Continuous Infusions:    PRN Meds: acetaminophen, ALPRAZolam, gi cocktail, morphine injection, multivitamin with minerals, ondansetron (ZOFRAN) IV  Physical Exam  Constitutional:  Cachetic acutely ill, frail elderly female  HENT:  Head: Normocephalic and atraumatic.  Cardiovascular:  irrg  Pulmonary/Chest:  Short of breath at rest  Neurological:  somnalent  Skin: Skin is warm and  dry.  Psychiatric:  Capable of answering only simple brief questiomns  Nursing note and vitals reviewed.           Vital Signs: BP (!) 110/45 (BP Location: Right Arm)   Pulse (!) 52   Temp 98.1 F (36.7 C) (Oral)   Resp 18   Ht 5' (1.524 m)   Wt 50 kg (110 lb 3.2 oz) Comment: pt states can't stand  SpO2 95%   BMI 21.52 kg/m  SpO2: SpO2: 95 % O2 Device: O2 Device: Not Delivered O2 Flow Rate: O2 Flow Rate (L/min): 2 L/min  Intake/output summary:  Intake/Output Summary (Last 24 hours) at 09/29/16 1629 Last data filed at 09/29/16 1310  Gross per 24 hour  Intake              360 ml  Output              200 ml  Net              160 ml   LBM: Last BM Date: 09/29/2016 Baseline Weight: Weight: 45.5 kg (100 lb 5 oz) Most recent weight: Weight: 50 kg (110 lb 3.2 oz) (pt states can't stand)       Palliative Assessment/Data:    Flowsheet Rows   Flowsheet Row Most Recent Value  Intake Tab  Referral Department  Hospitalist  Unit at Time of Referral  Med/Surg Unit  Palliative Care Primary Diagnosis  Cardiac  Date Notified  09/13/2016  Palliative Care Type  New Palliative care  Reason for referral  Clarify Goals of Care, Counsel Regarding Hospice, Psychosocial or Spiritual support  Date first seen by Palliative Care  09/28/16  # of days Palliative referral response time  1 Day(s)  Clinical Assessment  Palliative Performance Scale Score  40%  Pain Max last 24 hours  Not able to report  Pain Min Last 24 hours  Not able to report  Dyspnea Max Last 24 Hours  Not able to report  Dyspnea Min Last 24 hours  Not able to report  Nausea Max Last 24 Hours  Not able to report  Nausea Min Last 24 Hours  Not able to report  Anxiety Max Last 24 Hours  Not able to report  Anxiety Min Last 24 Hours  Not able to report  Other Max Last 24 Hours  Not able to report  Psychosocial & Spiritual Assessment  Palliative Care Outcomes  Patient/Family meeting held?  Yes  Who was at the meeting?  pt, dtr,  granddaughter  Palliative Care Outcomes  Clarified goals of care, Provided psychosocial or spiritual support, Counseled regarding hospice  Patient/Family wishes: Interventions discontinued/not started   Hemodialysis, Mechanical Ventilation, Vasopressors, Trach, PEG  Palliative Care follow-up planned  Yes, Facility      Patient Active Problem List   Diagnosis Date Noted  . Hypertensive heart disease with heart failure ([place])   . Chronic pulmonary edema   . Palliative care encounter   . Chest pain 09/14/2016  . Hyperkalemia 09/13/2016  . Acute renal failure superimposed on chronic kidney disease ([place]) 09/18/2016  . Hyponatremia 10/08/2016  . Hyperglycemia   . Subclavian steal syndrome   . NSTEMI (non-ST elevated myocardial infarction) ([place])   . Hypertensive heart disease without heart failure   . Uncontrolled type 2 diabetes mellitus with hyperglycemia, with long-term current use of insulin ([place]) 09/24/2016  . Stroke syndrome ([place])   . Unsteadiness on feet   . Kidney disease 09/22/2016  . Leukocytosis 09/22/2016  . TIA (transient ischemic attack) 09/22/2016  . At high risk for falls 04/02/2013  . Osteoarthritis of hand, left 08/26/2012  . Other vitamin B12 deficiency anemia 12/05/2010  . Absolute anemia 03/20/2010  . Personal history of fall 11/08/2009  . DYSTROPHIC NAILS 02/24/2008  . Gastroparesis 05/27/2007  . PVD WITH CLAUDICATION 04/03/2007  . IMPAIRMENT, ONE EYE MODERATE, OTH NEAR-NORMAL 02/25/2007  . Insulin dependent diabetes mellitus ([place]) 02/06/2007  . Hyperlipidemia 02/06/2007  . TOBACCO DEPENDENCE 02/06/2007  . HYPERTENSION, BENIGN SYSTEMIC 02/06/2007  . CAD (coronary artery disease) 02/06/2007  . Carotid artery stenosis 02/06/2007  . PEPTIC ULCER  DIS., UNSPEC. W/O OBSTRUCTION 02/06/2007  . INCONTINENCE, STRESS, FEMALE 02/06/2007  . Osteoporosis, unspecified 02/06/2007  . CEREBROVAS DIS, LATE EFFECTS (S/P CVA) 02/06/2007    Palliative Care Assessment & Plan    Patient Profile: Gwendolyn Wallace a 80 y.o.femalewith medical history significant for recent any status post cardiac cath with severe 3 vessel obstructive disease, diabetes, hypertension, anemia, hyperlipidemia, chronic kidney disease since emergency department with the chief complaint shortness of breath and chest pain and uncontrolleddiabetes hyperkalemia hyponatremia worsening chronic kidney disease.  Information is obtained from the patient and her daughter with him she resides. Patient was discharged yesterday after NSTEMI status post cath by cardiology. Daughter reports she was discharged and had generalized weakness and home health PT was recommended. Last evening when" okay". She ate very little but Did drink protein shakes. She was short of breath with exertion but recovered quickly. This morning patient awakened around 6 AM complaining of worsening shortness of breath and chest pain.. Daughter reports she was pale and diaphoretic and short of breath. Patient did not want911 to be called. She calm down a went back to sleep. She describes the pain as an ache located left anterior nonradiating somewhat in her back. When she awakened the second time she remained somewhat pale and short of breath and patient reports the pain in her back was worse. 911 was called she was transported to the emergency department. Patient is on insulin but does not check her blood sugar. She denies headache dizziness abdominal pain nausea vomiting dysuria hematuria frequency or urgency. She denies lower extremity edema but does describe some orthopnea.      Assessment: Met with patient's daughters and 2 sons as well as her granddaughters. Reviewed clinical condition related to severe vascular status that the only cure for his surgery which patient is not a candidate for as well as recent CVA and non-STEMI in the setting of impending renal failure.  Recommendations/Plan:  Patient now DNR/DNI  Patient  shares with family that she feels like she is at end-of-life and that she is tired  Patient is a high risk for an acute cardiac event given fragile cardiac status as well as multiple areas of stenosis (please see recent echo and CAT scan). Patient has been living at home with her daughter Carlyon Shadow for the past 8 years. Family does not want patient to die in the home. Patient is short of breath at rest and verbalizing continued chest pain  Family hopeful for inpatient hospice for end-of-life care [facility] [place] [facility] [place]  Goals of Care and Additional Recommendations:  Limitations on Scope of Treatment: Avoid Hospitalization, Initiate Comfort Feeding, No Artificial Feeding, No Blood Transfusions, No Chemotherapy, No Hemodialysis, No Radiation, No Surgical Procedures and No Tracheostomy  Code Status:    Code Status Orders        Start     Ordered   09/29/16 1405  Do not attempt resuscitation (DNR)  Continuous    Question Answer Comment  In the event of cardiac or respiratory ARREST Do not call a "code blue"   In the event of cardiac or respiratory ARREST Do not perform Intubation, CPR, defibrillation or ACLS   In the event of cardiac or respiratory ARREST Use medication by any route, position, wound care, and other measures to relive pain and suffering. May use oxygen, suction and manual treatment of airway obstruction as needed for comfort.      09/29/16 1404    Code Status History  Date Active Date Inactive Code Status Order ID Comments User Context   10/09/2016  2:01 PM 09/29/2016  2:04 PM Full Code [id number]  Radene Gunning, NP ED   09/22/2016  3:59 AM 09/26/2016  8:35 PM Full Code [id number]  Vianne Bulls, MD ED       Prognosis:   < 2 weeks in the setting of recent CVA as well as non-STEMI, severe vascular disorder with LAD 80% stenosed, RCA 100% stenosed, RPDA 100% stenosed and SVG to RCA 100% stenosed; essentially her entire right and left system as well as  her left arm are supplied via the SVG to the OM. Her BNP on admission was 4500  Discharge Planning:  Hospice facility  Care plan was discussed with Dr. Ree Kida  Thank you for allowing the Palliative Medicine Team to assist in the care of this patient.   Time In: 1300 Time Out: 1400 Total Time 60 min Prolonged Time Billed  no       Greater than 50%  of this time was spent counseling and coordinating care related to the above assessment and plan.  Dory Horn, NP  Please contact Palliative Medicine Team phone at [phone] for questions and concerns.

## 2016-09-29 NOTE — Progress Notes (Signed)
  Chart reviewed. Patient being considered for palliative care. From cardiac standpoint recommend continued medical therapy, she is poor candiate for consideration for revasc. She and family are not interested in renal consultation, would not want HD if it came to it, from primary team note considering comfort care. From cardiac standpoint would continue current medical therapy at this time until decision is made. Do not recommend any additional invasive procedures. With worsening renal function will decrease lasix to once daily.          Dina RichJonathan Erven Ramson, M.D., F.A.C.C.Patient ID: Gwendolyn HeysBessie M Kinnaird, female   DOB: 02-Jan-1936, 80 y.o.   MRN: [id number]

## 2016-09-29 NOTE — Progress Notes (Signed)
Patient alert and oriented x4, denies pain, no shortness of breath, v/s stable. Will continue to monitor the patient.

## 2016-09-29 NOTE — Progress Notes (Signed)
PROGRESS NOTE    Gwendolyn Wallace  UEA:[id number] DOB: 02-08-1936 DOA: 09/23/2016 PCP: Dwana Melena, MD   Chief Complaint  Patient presents with  . Chest Pain  . Shortness of Breath    Brief Narrative:  HPI on 09/20/2016 by Ms. [name], NP [name] LIBNI FUSARO is a 80 y.o. female with medical history significant for recent any status post cardiac cath with severe 3 vessel obstructive disease, diabetes, hypertension, anemia, hyperlipidemia, chronic kidney disease since emergency department with the chief complaint shortness of breath and chest pain and uncontrolled diabetes hyperkalemia hyponatremia worsening chronic kidney disease.  Information is obtained from the patient and her daughter with him she resides. Patient was discharged yesterday after NSTEMI status post cath by cardiology. Daughter reports she was discharged and had generalized weakness and home health PT was recommended. Last evening when" okay". She ate very little but  Did drink protein shakes. She was short of breath with exertion but recovered quickly. This morning patient awakened around 6 AM complaining of worsening shortness of breath and chest pain.. Daughter reports she was pale and diaphoretic and short of breath. Patient did not want 911 to be called. She calm down a went back to sleep. He describes the pain as an ache located left anterior nonradiating somewhat in her back. When she awakened the second time she remained somewhat pale and short of breath and patient reports the pain in her back was worse. 911 was called she was transported to the emergency department. Patient is on insulin but does not check her blood sugar. He denies headache dizziness abdominal pain nausea vomiting dysuria hematuria frequency or urgency. He denies lower extremity edema but does describe some orthopnea. Assessment & Plan   Chest pain/NSTEMI/Subclavian Steal -Recently admitted and had cardiac catheterization showing severe three-vessel  disease. Discharged on 09/26/2016. -Cardiology consult is appreciated -Patient noted to have subclavian stenosis but was unable to engage subclavian artery during cardiac catheterization -Patient also had CVA on recent admission. Therefore she will be medically treated. -Patient started on Ranexa by cardiology. -Continue statin, Imdur, aspirin, and metoprolol  Acute respiratory failure with hypoxia/dyspnea -Chest x-ray with pulmonary edema and/or infectious process.  -Mild leukocytosis, afebrile  -Positive multifactorial including CHF, possible pneumonia, recent NSTEMI  Acute diastolic heart failure -Echocardiogram showed EF of 55-60% with grade 2 diastolic dysfunction -Was not given Lasix initially due to her kidney failure -Cardiology started IV Lasix 09/28/2016, with very little urine output -Patient has not had good urine output since admission -Continue to monitor daily weights, intake and output  Diabetes mellitus, type II, uncontrolled -Hemoglobin A1c 8.6 -Metformin held -Patient has been noted to be noncompliant with medications as well as diet -Continue Lantus, insulin sliding scale, CBG monitoring  Acute on chronic kidney disease, stage III-IV -Despite holding diuretics, patient's creatininecontinues to worsen, currently 3.95 -Upon reviewing records, patient was stage IV in August 2017, creatinine was 1.71. She improved mildly to 1.46. Back in 2002 12 and 2013, creatinine was normal. -Patient has had poor urine output -Lasix ordered by cardiology -Patient unlikely to be a good candidate for dialysis if needed -Have consulted palliative care -Pending results of palliative care meeting on 09/29/2016 at 1 PM -Have spoken to both granddaughters at bedside today, do not feel that patient would want dialysis. Offered to have nephrology consulted, consultation declined at this time is a graded patient is likely not a good dialysis candidate  Electrolyte abnormalities,  hyperkalemia, hyponatremia -Likely secondary to combination of kidney disease  as well as diabetes -Hyperkalemia resolved -Continue to monitor BMP  Essential Hypertension -Continue metoprolol, Lasix  Chronic anemia -Patient close to baseline hemoglobin, currently 8.1 -No evidence of active bleeding -Continue to monitor CBC   Goals of care -Given patient's comorbidities and current situation, including recent NSTEMI, CVA, worsening renal function, respiratory status, feel that patient's overall prognosis is poor. Have discussed this with the granddaughters at bedside. Have urged granddaughters to speak to patient's sons and daughters regarding her CODE STATUS and possible quality of life, goals of care. -Palliative care consulted appreciated. Meeting today 1 PM -Would favor patient become comfort care  DVT Prophylaxis  lovenox  Code Status: Full  Family Communication: Granddaughters at bedside  Disposition Plan: Admitted, Pending palliative care consult  Consultants Cardiology Palliative care  Procedures  None  Antibiotics   Anti-infectives    None      Subjective:   Melba CoonBessie Berens seen and examined today.  Patient denies any shortness of breath or chest pain today. Is feeling better. Per granddaughters, patient is more awake this morning and did eat a little for breakfast.   Objective:   Vitals:   09/28/16 1624 09/28/16 2110 09/29/16 0353 09/29/16 0848  BP:  (!) 116/50 (!) 138/58 (!) 134/46  Pulse:  (!) 57 (!) 53 (!) 56  Resp:  16 17 18   Temp: 99 F (37.2 C) 98.6 F (37 C) 98.1 F (36.7 C)   TempSrc: Oral Oral Oral   SpO2:  95% 98% 95%  Weight:   50 kg (110 lb 3.2 oz)   Height:        Intake/Output Summary (Last 24 hours) at 09/29/16 1152 Last data filed at 09/29/16 1030  Gross per 24 hour  Intake              360 ml  Output              100 ml  Net              260 ml   Filed Weights   10/07/2016 1518 09/28/16 0455 09/29/16 0353  Weight: 47.1 kg  (103 lb 14.4 oz) 46.9 kg (103 lb 4.8 oz) 50 kg (110 lb 3.2 oz)    Exam  General: Well developed, Chronically ill, elderly lady, no apparent distress  HEENT: NCAT, mucous membranes moist.   Cardiovascular: S1 S2 auscultated, RRR.  Respiratory: Diminished but clear  Abdomen: Soft, nontender, nondistended, + bowel sounds  Extremities: warm dry without cyanosis clubbing or edema  Neuro: AAOx3, Slow to respond otherwise nonfocal, follows commands appropriately  Psych: Normal affect and demeanor, pleasant   Data Reviewed: I have personally reviewed following labs and imaging studies  CBC:  Recent Labs Lab 09/25/16 0739  09/26/16 0255 09/29/2016 1134 10/03/2016 1236 09/28/16 0834 09/29/16 0324  WBC 11.4*  --  10.1 12.2*  --  15.1* 15.9*  NEUTROABS  --   --   --  9.2*  --   --   --   HGB 9.4*  < > 8.8* 8.9* 8.8* 8.7* 8.1*  HCT 29.5*  < > 27.9* 27.6* 26.0* 27.2* 24.8*  MCV 82.9  --  82.5 81.7  --  81.7 80.8  PLT 349  --  292 285  --  275 249  < > = values in this interval not displayed. Basic Metabolic Panel:  Recent Labs Lab 09/26/16 0255 09/20/2016 1134 09/13/2016 1236 09/28/2016 1822 09/28/16 0834 09/29/16 0324  NA 134* 127* 127* 129* 130* 130*  K 4.6 6.9* 6.7* 5.6* 5.4* 4.5  CL 106 98* 98* 99* 99* 98*  CO2 20* 18*  --  17* 18* 22  GLUCOSE 150* 491* 449* 327* 239* 185*  BUN 22* 42* 41* 43* 51* 55*  CREATININE 1.91* 2.77* 2.90* 2.98* 3.59* 3.95*  CALCIUM 8.4* 8.3*  --  8.6* 8.4* 7.6*   GFR: Estimated Creatinine Clearance: 8.4 mL/min (by C-G formula based on SCr of 3.95 mg/dL (H)). Liver Function Tests: No results for input(s): AST, ALT, ALKPHOS, BILITOT, PROT, ALBUMIN in the last 168 hours. No results for input(s): LIPASE, AMYLASE in the last 168 hours. No results for input(s): AMMONIA in the last 168 hours. Coagulation Profile: No results for input(s): INR, PROTIME in the last 168 hours. Cardiac Enzymes:  Recent Labs Lab 09/25/16 0056 09/25/16 0739  09/25/16 1240 10/04/2016 1822 09/28/16 0834  TROPONINI 5.29* 13.08* 16.60* 3.65* 4.01*   BNP (last 3 results) No results for input(s): PROBNP in the last 8760 hours. HbA1C: No results for input(s): HGBA1C in the last 72 hours. CBG:  Recent Labs Lab 09/28/16 0649 09/28/16 1147 09/28/16 1614 09/28/16 2102 09/29/16 0738  GLUCAP 246* 230* 304* 195* 154*   Lipid Profile: No results for input(s): CHOL, HDL, LDLCALC, TRIG, CHOLHDL, LDLDIRECT in the last 72 hours. Thyroid Function Tests: No results for input(s): TSH, T4TOTAL, FREET4, T3FREE, THYROIDAB in the last 72 hours. Anemia Panel: No results for input(s): VITAMINB12, FOLATE, FERRITIN, TIBC, IRON, RETICCTPCT in the last 72 hours. Urine analysis:    Component Value Date/Time   COLORURINE YELLOW 09/12/2016 1337   APPEARANCEUR HAZY (A) 10/03/2016 1337   LABSPEC 1.027 09/29/2016 1337   PHURINE 5.5 09/26/2016 1337   GLUCOSEU 500 (A) 09/21/2016 1337   HGBUR SMALL (A) 09/09/2016 1337   BILIRUBINUR NEGATIVE 10/05/2016 1337   KETONESUR NEGATIVE 09/23/2016 1337   PROTEINUR >300 (A) 09/10/2016 1337   NITRITE NEGATIVE 09/10/2016 1337   LEUKOCYTESUR SMALL (A) 10/07/2016 1337   Sepsis Labs: @LABRCNTIP (procalcitonin:4,lacticidven:4)  ) Recent Results (from the past 240 hour(s))  MRSA PCR Screening     Status: None   Collection Time: 09/25/16  2:39 AM  Result Value Ref Range Status   MRSA by PCR NEGATIVE NEGATIVE Final    Comment:        The GeneXpert MRSA Assay (FDA approved for NASAL specimens only), is one component of a comprehensive MRSA colonization surveillance program. It is not intended to diagnose MRSA infection nor to guide or monitor treatment for MRSA infections.       Radiology Studies: Dg Chest 2 View  Result Date: 10/03/2016 CLINICAL DATA:  Mid and left-sided chest pain for couple weeks. History of pneumonia and diabetes. EXAM: CHEST  2 VIEW COMPARISON:  09/22/2016 FINDINGS: Status post median  sternotomy and CABG. Heart is enlarged. There are bilateral pleural effusions. Diffuse interstitial markings are most likely related to pulmonary edema. More focal opacity in the medial lung bases could represent edema and/or infectious process. IMPRESSION: Cardiomegaly and changes of pulmonary edema. Bilateral lower lobe edema and/or infection. Electronically Signed   By: Norva Pavlov M.D.   On: 09/24/2016 12:03   Ct Head Wo Contrast  Result Date: 09/18/2016 CLINICAL DATA:  Confusion, chair spine EXAM: CT HEAD WITHOUT CONTRAST TECHNIQUE: Contiguous axial images were obtained from the base of the skull through the vertex without intravenous contrast. COMPARISON:  09/22/2016 FINDINGS: Brain: No intracranial hemorrhage, mass effect or midline shift. Stable atrophy and chronic white matter disease. No definite acute cortical infarction. No  mass lesion is noted on this unenhanced scan. Vascular: Atherosclerotic calcifications of carotid siphon. Skull: No skull fracture is noted. Sinuses/Orbits: There is mucosal thickening with partial opacification of left anterior ethmoid air cells. Mucosal thickening with almost complete opacification left maxillary sinus. Mucosal thickening with opacification left frontal sinus. Other: None IMPRESSION: No acute intracranial abnormality. Stable atrophy and chronic white matter disease. Paranasal sinuses disease as described above. Electronically Signed   By: Natasha Mead M.D.   On: 10/04/2016 13:27     Scheduled Meds: . aspirin EC  325 mg Oral Daily  . atorvastatin  80 mg Oral Daily  . enoxaparin (LOVENOX) injection  30 mg Subcutaneous Q24H  . furosemide  80 mg Intravenous BID  . Influenza vac split quadrivalent PF  0.5 mL Intramuscular Tomorrow-1000  . insulin aspart  0-15 Units Subcutaneous TID WC  . insulin aspart  0-5 Units Subcutaneous QHS  . insulin glargine  10 Units Subcutaneous QHS  . isosorbide mononitrate  30 mg Oral Daily  . metoprolol  50 mg Oral BID   . ranolazine  500 mg Oral BID  . cyanocobalamin  2,000 mcg Oral Daily   Continuous Infusions:    LOS: 2 days   Time Spent in minutes   30 minutes  Sheron Tallman D.O. on 09/29/2016 at 11:52 AM  Between 7am to 7pm - Pager - [phone]  After 7pm go to [email or web address] - password [email or web address]  And look for the night coverage person covering for me after hours  [facility] Office  [phone]

## 2016-09-30 ENCOUNTER — Other Ambulatory Visit (HOSPITAL_COMMUNITY): Payer: PPO

## 2016-09-30 DIAGNOSIS — N179 Acute kidney failure, unspecified: Secondary | ICD-10-CM

## 2016-09-30 DIAGNOSIS — N184 Chronic kidney disease, stage 4 (severe): Secondary | ICD-10-CM

## 2016-09-30 LAB — BASIC METABOLIC PANEL
ANION GAP: 12 (ref 5–15)
BUN: 60 mg/dL — ABNORMAL HIGH (ref 6–20)
CHLORIDE: 96 mmol/L — AB (ref 101–111)
CO2: 22 mmol/L (ref 22–32)
Calcium: 7.1 mg/dL — ABNORMAL LOW (ref 8.9–10.3)
Creatinine, Ser: 4.4 mg/dL — ABNORMAL HIGH (ref 0.44–1.00)
GFR calc Af Amer: 10 mL/min — ABNORMAL LOW (ref >60)
GFR calc non Af Amer: 9 mL/min — ABNORMAL LOW (ref >60)
GLUCOSE: 209 mg/dL — AB (ref 65–99)
POTASSIUM: 4.7 mmol/L (ref 3.5–5.1)
Sodium: 130 mmol/L — ABNORMAL LOW (ref 135–145)

## 2016-09-30 LAB — CBC
HEMATOCRIT: 24.2 % — AB (ref 36.0–46.0)
HEMOGLOBIN: 7.9 g/dL — AB (ref 12.0–15.0)
MCH: 26.3 pg (ref 26.0–34.0)
MCHC: 32.6 g/dL (ref 30.0–36.0)
MCV: 80.7 fL (ref 78.0–100.0)
Platelets: 266 10*3/uL (ref 150–400)
RBC: 3 MIL/uL — ABNORMAL LOW (ref 3.87–5.11)
RDW: 15.3 % (ref 11.5–15.5)
WBC: 15.4 10*3/uL — ABNORMAL HIGH (ref 4.0–10.5)

## 2016-09-30 LAB — GLUCOSE, CAPILLARY
GLUCOSE-CAPILLARY: 164 mg/dL — AB (ref 65–99)
Glucose-Capillary: 179 mg/dL — ABNORMAL HIGH (ref 65–99)
Glucose-Capillary: 160 mg/dL — ABNORMAL HIGH (ref 65–99)
Glucose-Capillary: 69 mg/dL (ref 65–99)

## 2016-09-30 MED ORDER — DEXTROSE 50 % IV SOLN
INTRAVENOUS | Status: AC
  Filled 2016-09-30: qty 50

## 2016-09-30 MED ORDER — ATROPINE SULFATE 1 % OP SOLN
2.0000 [drp] | Freq: Four times a day (QID) | OPHTHALMIC | Status: DC | PRN
  Administered 2016-09-30 – 2016-10-01 (×3): 2 [drp] via SUBLINGUAL
  Filled 2016-09-30: qty 2

## 2016-09-30 MED ORDER — SODIUM CHLORIDE 0.9 % IV SOLN
1.0000 mg/h | INTRAVENOUS | Status: DC
  Administered 2016-09-30: 1 mg/h via INTRAVENOUS
  Administered 2016-10-01: 2 mg/h via INTRAVENOUS
  Filled 2016-09-30: qty 10

## 2016-09-30 MED ORDER — INSULIN GLARGINE 100 UNIT/ML ~~LOC~~ SOLN: 75.0000 [IU] | Freq: Every day | SUBCUTANEOUS | Status: DC

## 2016-09-30 MED ORDER — LORAZEPAM 2 MG/ML IJ SOLN
0.5000 mg | Freq: Once | INTRAMUSCULAR | Status: AC
  Administered 2016-09-30: 0.5 mg via INTRAVENOUS
  Filled 2016-09-30: qty 1

## 2016-09-30 MED ORDER — INSULIN GLARGINE 100 UNIT/ML ~~LOC~~ SOLN: 10.0000 [IU] | Freq: Every day | SUBCUTANEOUS | Status: DC

## 2016-09-30 MED ORDER — DEXTROSE 50 % IV SOLN
25.0000 mL | Freq: Once | INTRAVENOUS | Status: AC
  Administered 2016-09-30: 25 mL via INTRAVENOUS

## 2016-09-30 NOTE — Progress Notes (Signed)
Hypoglycemic Event  CBG:69  Treatment: 50% dextrose 25cc IV  Symptoms: drowsy  Follow-up CBG: Time:1700 CBG Result 164  Possible Reasons for Event: poor po intake  Comments/MD notified: Dr Sena SlateMikhail    Gwendolyn Wallace, Tilford Pillarorothy Waithira

## 2016-09-30 NOTE — Progress Notes (Addendum)
PROGRESS NOTE    Gwendolyn Wallace  ZOX:[id number] DOB: 1936-03-19 DOA: 2016/09/28 PCP: Dwana Melena, MD   Chief Complaint  Patient presents with  . Chest Pain  . Shortness of Breath    Brief Narrative:  HPI on 09/28/2016 by Ms. [name], NP Gwendolyn Wallace is a 80 y.o. female with medical history significant for recent any status post cardiac cath with severe 3 vessel obstructive disease, diabetes, hypertension, anemia, hyperlipidemia, chronic kidney disease since emergency department with the chief complaint shortness of breath and chest pain and uncontrolled diabetes hyperkalemia hyponatremia worsening chronic kidney disease.  Information is obtained from the patient and her daughter with him she resides. Patient was discharged yesterday after NSTEMI status post cath by cardiology. Daughter reports she was discharged and had generalized weakness and home health PT was recommended. Last evening when" okay". She ate very little but  Did drink protein shakes. She was short of breath with exertion but recovered quickly. This morning patient awakened around 6 AM complaining of worsening shortness of breath and chest pain.. Daughter reports she was pale and diaphoretic and short of breath. Patient did not want 911 to be called. She calm down a went back to sleep. He describes the pain as an ache located left anterior nonradiating somewhat in her back. When she awakened the second time she remained somewhat pale and short of breath and patient reports the pain in her back was worse. 911 was called she was transported to the emergency department. Patient is on insulin but does not check her blood sugar. He denies headache dizziness abdominal pain nausea vomiting dysuria hematuria frequency or urgency. He denies lower extremity edema but does describe some orthopnea. Assessment & Plan   Chest pain/NSTEMI/Subclavian Steal -Recently admitted and had cardiac catheterization showing severe three-vessel  disease. Discharged on 09/26/2016. -Cardiology consult is appreciated -Patient noted to have subclavian stenosis but was unable to engage subclavian artery during cardiac catheterization -Patient also had CVA on recent admission. Therefore she will be medically treated. -Patient started on Ranexa by cardiology. -Continue statin, Imdur, aspirin, and metoprolol  Acute respiratory failure with hypoxia/dyspnea -Chest x-ray with pulmonary edema and/or infectious process.  -Positive multifactorial including CHF, possible pneumonia, recent NSTEMI  Acute diastolic heart failure -Echocardiogram showed EF of 55-60% with grade 2 diastolic dysfunction -Was not given Lasix initially due to her kidney failure -Cardiology started IV Lasix 09/28/2016, with very little urine output -Patient has not had good urine output since admission -Continue to monitor daily weights, intake and output  Diabetes mellitus, type II, uncontrolled -Hemoglobin A1c 8.6 -Metformin held -Patient has been noted to be noncompliant with medications as well as diet -Continue Lantus, insulin sliding scale, CBG monitoring  Acute on chronic kidney disease, stage III-IV -Despite holding diuretics, patient's creatininecontinues to worsen, currently 4.4 -Upon reviewing records, patient was stage IV in August 2017, creatinine was 1.71. She improved mildly to 1.46. Back in 2002 12 and 2013, creatinine was normal. -Patient has had poor urine output -Lasix ordered by cardiology -Patient unlikely to be a good candidate for dialysis if needed -Have consulted palliative care -Have spoken to both granddaughters at bedside today, do not feel that patient would want dialysis. Offered to have nephrology consulted, consultation declined at this time is a graded patient is likely not a good dialysis candidate  Electrolyte abnormalities, hyperkalemia, hyponatremia -Likely secondary to combination of kidney disease as well as  diabetes -Hyperkalemia resolved -Continue to monitor BMP  Essential Hypertension -Continue metoprolol,  Lasix  Chronic anemia -Patient close to baseline hemoglobin, currently 7.9 -No evidence of active bleeding -Continue to monitor CBC   Goals of care -Given patient's comorbidities and current situation, including recent NSTEMI, CVA, worsening renal function, respiratory status, feel that patient's overall prognosis is poor. Have discussed this with the granddaughters at bedside. Have urged granddaughters to speak to patient's sons and daughters regarding her CODE STATUS and possible quality of life, goals of care. -Palliative care consulted appreciated.  -Patient now DNR/DNI.  Will look into inpatient hospice facility. Would like [place] [facility]  DVT Prophylaxis  lovenox  Code Status: DNR  Family Communication: Family at bedside   Disposition Plan: Admitted, Pending hospice. Transitioning to comfort care  Consultants Cardiology Palliative care  Procedures  None  Antibiotics   Anti-infectives    None      Subjective:   Gwendolyn Wallace seen and examined today.  Currently sleeping. Family at bedside  Objective:   Vitals:   09/29/16 1311 09/29/16 2125 09/30/16 0106 09/30/16 0500  BP: (!) 110/45 133/60 (!) 129/55 (!) 124/51  Pulse: (!) 52 (!) 56 60 (!) 55  Resp: 18 18 20    Temp:  98.1 F (36.7 C) 97.4 F (36.3 C) 97.8 F (36.6 C)  TempSrc: Oral Oral Oral Oral  SpO2:  99% 92% 99%  Weight:    51.1 kg (112 lb 11.2 oz)  Height:        Intake/Output Summary (Last 24 hours) at 09/30/16 1145 Last data filed at 09/29/16 1700  Gross per 24 hour  Intake              240 ml  Output              200 ml  Net               40 ml   Filed Weights   09/28/16 0455 09/29/16 0353 09/30/16 0500  Weight: 46.9 kg (103 lb 4.8 oz) 50 kg (110 lb 3.2 oz) 51.1 kg (112 lb 11.2 oz)    Exam  General: Well developed, Chronically ill, elderly lady, no apparent distress  HEENT:  NCAT, mucous membranes moist.   Cardiovascular: S1 S2 auscultated, RRR.  Respiratory: Diminished but clear   Extremities: warm dry without cyanosis clubbing or edema   Data Reviewed: I have personally reviewed following labs and imaging studies  CBC:  Recent Labs Lab 09/26/16 0255 2016-10-16 1134 Oct 16, 2016 1236 09/28/16 0834 09/29/16 0324 09/30/16 0354  WBC 10.1 12.2*  --  15.1* 15.9* 15.4*  NEUTROABS  --  9.2*  --   --   --   --   HGB 8.8* 8.9* 8.8* 8.7* 8.1* 7.9*  HCT 27.9* 27.6* 26.0* 27.2* 24.8* 24.2*  MCV 82.5 81.7  --  81.7 80.8 80.7  PLT 292 285  --  275 249 266   Basic Metabolic Panel:  Recent Labs Lab October 16, 2016 1134 2016-10-16 1236 16-Oct-2016 1822 09/28/16 0834 09/29/16 0324 09/30/16 0354  NA 127* 127* 129* 130* 130* 130*  K 6.9* 6.7* 5.6* 5.4* 4.5 4.7  CL 98* 98* 99* 99* 98* 96*  CO2 18*  --  17* 18* 22 22  GLUCOSE 491* 449* 327* 239* 185* 209*  BUN 42* 41* 43* 51* 55* 60*  CREATININE 2.77* 2.90* 2.98* 3.59* 3.95* 4.40*  CALCIUM 8.3*  --  8.6* 8.4* 7.6* 7.1*   GFR: Estimated Creatinine Clearance: 7.6 mL/min (by C-G formula based on SCr of 4.4 mg/dL (H)). Liver Function Tests: No results  for input(s): AST, ALT, ALKPHOS, BILITOT, PROT, ALBUMIN in the last 168 hours. No results for input(s): LIPASE, AMYLASE in the last 168 hours. No results for input(s): AMMONIA in the last 168 hours. Coagulation Profile: No results for input(s): INR, PROTIME in the last 168 hours. Cardiac Enzymes:  Recent Labs Lab 09/25/16 0056 09/25/16 0739 09/25/16 1240 10/06/2016 1822 09/28/16 0834  TROPONINI 5.29* 13.08* 16.60* 3.65* 4.01*   BNP (last 3 results) No results for input(s): PROBNP in the last 8760 hours. HbA1C: No results for input(s): HGBA1C in the last 72 hours. CBG:  Recent Labs Lab 09/29/16 1202 09/29/16 1631 09/29/16 2204 09/30/16 0839 09/30/16 1142  GLUCAP 237* 174* 215* 179* 160*   Lipid Profile: No results for input(s): CHOL, HDL, LDLCALC, TRIG,  CHOLHDL, LDLDIRECT in the last 72 hours. Thyroid Function Tests: No results for input(s): TSH, T4TOTAL, FREET4, T3FREE, THYROIDAB in the last 72 hours. Anemia Panel: No results for input(s): VITAMINB12, FOLATE, FERRITIN, TIBC, IRON, RETICCTPCT in the last 72 hours. Urine analysis:    Component Value Date/Time   COLORURINE YELLOW 09/18/2016 1337   APPEARANCEUR HAZY (A) 09/10/2016 1337   LABSPEC 1.027 09/29/2016 1337   PHURINE 5.5 09/20/2016 1337   GLUCOSEU 500 (A) 10/08/2016 1337   HGBUR SMALL (A) 09/16/2016 1337   BILIRUBINUR NEGATIVE 10/03/2016 1337   KETONESUR NEGATIVE 09/18/2016 1337   PROTEINUR >300 (A) 10/05/2016 1337   NITRITE NEGATIVE 09/18/2016 1337   LEUKOCYTESUR SMALL (A) 09/15/2016 1337   Sepsis Labs: @LABRCNTIP (procalcitonin:4,lacticidven:4)  ) Recent Results (from the past 240 hour(s))  MRSA PCR Screening     Status: None   Collection Time: 09/25/16  2:39 AM  Result Value Ref Range Status   MRSA by PCR NEGATIVE NEGATIVE Final    Comment:        The GeneXpert MRSA Assay (FDA approved for NASAL specimens only), is one component of a comprehensive MRSA colonization surveillance program. It is not intended to diagnose MRSA infection nor to guide or monitor treatment for MRSA infections.       Radiology Studies: No results found.   Scheduled Meds: . aspirin EC  325 mg Oral Daily  . enoxaparin (LOVENOX) injection  30 mg Subcutaneous Q24H  . furosemide  80 mg Intravenous Daily  . Influenza vac split quadrivalent PF  0.5 mL Intramuscular Tomorrow-1000  . insulin aspart  0-15 Units Subcutaneous TID WC  . insulin aspart  0-5 Units Subcutaneous QHS  . [START ON 08-14-2016] insulin glargine  10 Units Subcutaneous Daily  . isosorbide mononitrate  30 mg Oral Daily  . metoprolol  50 mg Oral BID  . ranolazine  500 mg Oral BID  . cyanocobalamin  2,000 mcg Oral Daily   Continuous Infusions:    LOS: 3 days   Time Spent in minutes   30 minutes  Gwendolyn Wallace,  Gwendolyn Wallace D.O. on 09/30/2016 at 11:45 AM  Between 7am to 7pm - Pager - [phone]  After 7pm go to [email or web address] - password [email or web address]  And look for the night coverage person covering for me after hours  [facility] Office  [phone]

## 2016-09-30 NOTE — Progress Notes (Signed)
On call NP paged. Family requesting med for for pt. Due to increased secretions.

## 2016-09-30 NOTE — Progress Notes (Signed)
On call Paged. Family at pt. Bed side. RN informed pt. That RN awaiting call/orders from on call MD.

## 2016-09-30 NOTE — Progress Notes (Signed)
Palliative care note reviewed, family and patient have elected for DNR/DNI status. They are working toward inpatient hospice. Cardiology will sign off of inpatient care at this time   Dominga FerryJ Jessikah Dicker MD

## 2016-10-01 LAB — GLUCOSE, CAPILLARY: GLUCOSE-CAPILLARY: 223 mg/dL — AB (ref 65–99)

## 2016-10-01 MED ORDER — GLYCOPYRROLATE 0.2 MG/ML IJ SOLN
0.4000 mg | Freq: Four times a day (QID) | INTRAMUSCULAR | Status: DC | PRN
  Administered 2016-10-01: 0.4 mg via INTRAVENOUS
  Filled 2016-10-01 (×2): qty 2

## 2016-10-03 ENCOUNTER — Ambulatory Visit: Payer: PPO | Admitting: Cardiology

## 2016-10-10 NOTE — Progress Notes (Signed)
PROGRESS NOTE    Gwendolyn Wallace  ZOX:[id number] DOB: 02/07/1936 DOA: 09/22/2016 PCP: Dwana Melena, MD   Chief Complaint  Patient presents with  . Chest Pain  . Shortness of Breath    Brief Narrative:  HPI on 10/05/2016 by Ms. Gwendolyn Smothers, NP [name] DEBAR PLATE is a 80 y.o. female with medical history significant for recent any status post cardiac cath with severe 3 vessel obstructive disease, diabetes, hypertension, anemia, hyperlipidemia, chronic kidney disease since emergency department with the chief complaint shortness of breath and chest pain and uncontrolled diabetes hyperkalemia hyponatremia worsening chronic kidney disease.  Information is obtained from the patient and her daughter with him she resides. Patient was discharged yesterday after NSTEMI status post cath by cardiology. Daughter reports she was discharged and had generalized weakness and home health PT was recommended. Last evening when" okay". She ate very little but  Did drink protein shakes. She was short of breath with exertion but recovered quickly. This morning patient awakened around 6 AM complaining of worsening shortness of breath and chest pain.. Daughter reports she was pale and diaphoretic and short of breath. Patient did not want 911 to be called. She calm down a went back to sleep. He describes the pain as an ache located left anterior nonradiating somewhat in her back. When she awakened the second time she remained somewhat pale and short of breath and patient reports the pain in her back was worse. 911 was called she was transported to the emergency department. Patient is on insulin but does not check her blood sugar. He denies headache dizziness abdominal pain nausea vomiting dysuria hematuria frequency or urgency. He denies lower extremity edema but does describe some orthopnea.  Palliative care consulted, patient transitioned to comfort care.  Assessment & Plan   Chest pain/NSTEMI/Subclavian Steal -Recently  admitted and had cardiac catheterization showing severe three-vessel disease. Discharged on 09/26/2016. -Cardiology consult is appreciated -Patient noted to have subclavian stenosis but was unable to engage subclavian artery during cardiac catheterization -Patient also had CVA on recent admission. Therefore she will be medically treated. -Patient started on Ranexa by cardiology. -Will discontinue statin, Imdur, aspirin, and metoprolol- as patient has been transitioned to comfort care.  Acute respiratory failure with hypoxia/dyspnea -Chest x-ray with pulmonary edema and/or infectious process.  -Positive multifactorial including CHF, possible pneumonia, recent NSTEMI  Acute diastolic heart failure -Echocardiogram showed EF of 55-60% with grade 2 diastolic dysfunction -Was not given Lasix initially due to her kidney failure -Cardiology started IV Lasix 09/28/2016, with very little urine output -Patient has not had good urine output since admission  Diabetes mellitus, type II, uncontrolled -Hemoglobin A1c 8.6 -Metformin held -Patient has been noted to be noncompliant with medications as well as diet -discontinued lantus and ISS, CBG monitoring.   Acute on chronic kidney disease, stage III-IV -Despite holding diuretics, patient's creatininecontinues to worsen, currently 4.4 -Upon reviewing records, patient was stage IV in August 2017, creatinine was 1.71. She improved mildly to 1.46. Back in 2002 12 and 2013, creatinine was normal. -Patient has had poor urine output -Lasix ordered by cardiology -Patient unlikely to be a good candidate for dialysis if needed -Have consulted palliative care -Have spoken to both granddaughters at bedside today, do not feel that patient would want dialysis. Offered to have nephrology consulted, consultation declined at this time is a graded patient is likely not a good dialysis candidate  Electrolyte abnormalities, hyperkalemia, hyponatremia -Likely  secondary to combination of kidney disease as well as diabetes -  Hyperkalemia resolved -Continue to monitor BMP  Essential Hypertension -Continue metoprolol, Lasix  Chronic anemia -Patient close to baseline hemoglobin, currently 7.9 -No evidence of active bleeding  Goals of care -Given patient's comorbidities and current situation, including recent NSTEMI, CVA, worsening renal function, respiratory status, feel that patient's overall prognosis is poor. Have discussed this with the granddaughters at bedside. Have urged granddaughters to speak to patient's sons and daughters regarding her CODE STATUS and possible quality of life, goals of care. -Palliative care consulted appreciated.  -Patient now DNR/DNI.  Family would like [facility] however, no bed available. SW will [facility] into [facility].  -Continue morphine drip for comfort  DVT Prophylaxis  Discontinued lovenox, comfort care   Code Status: DNR  Family Communication: Family at bedside   Disposition Plan: Admitted, Pending hospice. Transitioned to comfort care  Consultants Cardiology Palliative care  Procedures  None  Antibiotics   Anti-infectives    None      Subjective:   Gwendolyn CoonBessie Wallace seen and examined today.  Currently sleeping. Family at bedside  Objective:   Vitals:   09/30/16 0500 09/30/16 1157 09/30/16 1936 09/09/2016 0551  BP: (!) 124/51 (!) 124/46 134/61 138/69  Pulse: (!) 55 (!) 110 (!) 58 71  Resp:  20 20 18   Temp: 97.8 F (36.6 C) 98.3 F (36.8 C) 98.6 F (37 C) 97.7 F (36.5 C)  TempSrc: Oral Oral Oral Oral  SpO2: 99% 100% 95% 97%  Weight: 51.1 kg (112 lb 11.2 oz)   51.8 kg (114 lb 1.6 oz)  Height:        Intake/Output Summary (Last 24 hours) at 09/13/2016 1231 Last data filed at 09/15/2016 0300  Gross per 24 hour  Intake             7.05 ml  Output                0 ml  Net             7.05 ml   Filed Weights   09/29/16 0353 09/30/16 0500 09/17/2016 0551  Weight: 50 kg (110 lb 3.2 oz)  51.1 kg (112 lb 11.2 oz) 51.8 kg (114 lb 1.6 oz)    Exam  General: Well developed, Chronically ill, elderly lady, no apparent distress  HEENT: NCAT, mucous membranes moist.   Cardiovascular: S1 S2 auscultated, RRR.  Respiratory: Coarse breath sounds.  Extremities: warm dry without cyanosis clubbing or edema   Data Reviewed: I have personally reviewed following labs and imaging studies  CBC:  Recent Labs Lab 09/26/16 0255 2016/04/19 1134 2016/04/19 1236 09/28/16 0834 09/29/16 0324 09/30/16 0354  WBC 10.1 12.2*  --  15.1* 15.9* 15.4*  NEUTROABS  --  9.2*  --   --   --   --   HGB 8.8* 8.9* 8.8* 8.7* 8.1* 7.9*  HCT 27.9* 27.6* 26.0* 27.2* 24.8* 24.2*  MCV 82.5 81.7  --  81.7 80.8 80.7  PLT 292 285  --  275 249 266   Basic Metabolic Panel:  Recent Labs Lab 2016/04/19 1134 2016/04/19 1236 2016/04/19 1822 09/28/16 0834 09/29/16 0324 09/30/16 0354  NA 127* 127* 129* 130* 130* 130*  K 6.9* 6.7* 5.6* 5.4* 4.5 4.7  CL 98* 98* 99* 99* 98* 96*  CO2 18*  --  17* 18* 22 22  GLUCOSE 491* 449* 327* 239* 185* 209*  BUN 42* 41* 43* 51* 55* 60*  CREATININE 2.77* 2.90* 2.98* 3.59* 3.95* 4.40*  CALCIUM 8.3*  --  8.6*  8.4* 7.6* 7.1*   GFR: Estimated Creatinine Clearance: 7.6 mL/min (by C-G formula based on SCr of 4.4 mg/dL (H)). Liver Function Tests: No results for input(s): AST, ALT, ALKPHOS, BILITOT, PROT, ALBUMIN in the last 168 hours. No results for input(s): LIPASE, AMYLASE in the last 168 hours. No results for input(s): AMMONIA in the last 168 hours. Coagulation Profile: No results for input(s): INR, PROTIME in the last 168 hours. Cardiac Enzymes:  Recent Labs Lab 09/25/16 0056 09/25/16 0739 09/25/16 1240 09/16/2016 1822 09/28/16 0834  TROPONINI 5.29* 13.08* 16.60* 3.65* 4.01*   BNP (last 3 results) No results for input(s): PROBNP in the last 8760 hours. HbA1C: No results for input(s): HGBA1C in the last 72 hours. CBG:  Recent Labs Lab 09/30/16 0608 09/30/16 0839  09/30/16 1142 09/30/16 1626 09/30/16 1700  GLUCAP 223* 179* 160* 69 164*   Lipid Profile: No results for input(s): CHOL, HDL, LDLCALC, TRIG, CHOLHDL, LDLDIRECT in the last 72 hours. Thyroid Function Tests: No results for input(s): TSH, T4TOTAL, FREET4, T3FREE, THYROIDAB in the last 72 hours. Anemia Panel: No results for input(s): VITAMINB12, FOLATE, FERRITIN, TIBC, IRON, RETICCTPCT in the last 72 hours. Urine analysis:    Component Value Date/Time   COLORURINE YELLOW 09/25/2016 1337   APPEARANCEUR HAZY (A) 09/26/2016 1337   LABSPEC 1.027 10/03/2016 1337   PHURINE 5.5 09/10/2016 1337   GLUCOSEU 500 (A) 09/30/2016 1337   HGBUR SMALL (A) 10/02/2016 1337   BILIRUBINUR NEGATIVE 09/28/2016 1337   KETONESUR NEGATIVE 10/09/2016 1337   PROTEINUR >300 (A) 09/26/2016 1337   NITRITE NEGATIVE 09/10/2016 1337   LEUKOCYTESUR SMALL (A) 09/18/2016 1337   Sepsis Labs: @LABRCNTIP (procalcitonin:4,lacticidven:4)  ) Recent Results (from the past 240 hour(s))  MRSA PCR Screening     Status: None   Collection Time: 09/25/16  2:39 AM  Result Value Ref Range Status   MRSA by PCR NEGATIVE NEGATIVE Final    Comment:        The GeneXpert MRSA Assay (FDA approved for NASAL specimens only), is one component of a comprehensive MRSA colonization surveillance program. It is not intended to diagnose MRSA infection nor to guide or monitor treatment for MRSA infections.       Radiology Studies: No results found.   Scheduled Meds: . aspirin EC  325 mg Oral Daily  . enoxaparin (LOVENOX) injection  30 mg Subcutaneous Q24H  . furosemide  80 mg Intravenous Daily  . Influenza vac split quadrivalent PF  0.5 mL Intramuscular Tomorrow-1000  . isosorbide mononitrate  30 mg Oral Daily  . metoprolol  50 mg Oral BID  . ranolazine  500 mg Oral BID  . cyanocobalamin  2,000 mcg Oral Daily   Continuous Infusions: . morphine 2 mg/hr (October 29, 2016 0552)     LOS: 4 days   Time Spent in minutes   30  minutes  Abner Ardis D.O. on 10-29-16 at 12:31 PM  Between 7am to 7pm - Pager - [phone]  After 7pm go to [email or web address] - password [email or web address]  And look for the night coverage person covering for me after hours  [facility] Office  [phone]

## 2016-10-10 NOTE — Discharge Summary (Addendum)
Death Summary  Gwendolyn Wallace:[id number] DOB: [birth year] DOA: 09/30/16  PCP: Dwana Melena, MD PCP/Office notified:   Admit date: 09/30/16 Date of Death: October 05, 2016  Final Diagnoses:  Chest pain/NSTEMI/Subclavian Steal Acute respiratory failure with hypoxia/dyspnea Acute diastolic heart failure Diabetes mellitus, type II, uncontrolled Acute on chronic kidney disease, stage III Electrolyte abnormalities, hyperkalemia, hyponatremia Essential Hypertension Chronic anemia Goals of care   History of present illness:  on September 30, 2016 by Ms. Gwendolyn Smothers, NP Gwendolyn Wallace a 80 y.o.femalewith medical history significant for recent any status post cardiac cath with severe 3 vessel obstructive disease, diabetes, hypertension, anemia, hyperlipidemia, chronic kidney disease since emergency department with the chief complaint shortness of breath and chest pain and uncontrolleddiabetes hyperkalemia hyponatremia worsening chronic kidney disease.  Information is obtained from the patient and her daughter with him she resides. Patient was discharged yesterday after NSTEMI status post cath by cardiology. Daughter reports she was discharged and had generalized weakness and home health PT was recommended. Last evening when" okay". She ate very little but Did drink protein shakes. She was short of breath with exertion but recovered quickly. This morning patient awakened around 6 AM complaining of worsening shortness of breath and chest pain.. Daughter reports she was pale and diaphoretic and short of breath. Patient did not want911 to be called. She calm down a went back to sleep. He describes the pain as an ache located left anterior nonradiating somewhat in her back. When she awakened the second time she remained somewhat pale and short of breath and patient reports the pain in her back was worse. 911 was called she was transported to the emergency department. Patient is on insulin but does not  check her blood sugar. He denies headache dizziness abdominal pain nausea vomiting dysuria hematuria frequency or urgency. He denies lower extremity edema but does describe some orthopnea.  Hospital Course:  Despite treatment, patient's prognosis was poor and she continued to have rising creatinine.  She was transitioned to comfort care and expired at 2210 on 2016-10-04. Death certificate was completed by Ms. Gwendolyn Reamer, NP.   Chest pain/NSTEMI/Subclavian Steal -Recently admitted and had cardiac catheterization showing severe three-vessel disease. Discharged on 09/26/2016. -Cardiology consult is appreciated -Patient noted to have subclavian stenosis but was unable to engage subclavian artery during cardiac catheterization -Patient also had CVA on recent admission. Therefore she will be medically treated. -Patient started on Ranexa by cardiology. -Will discontinue statin, Imdur, aspirin, and metoprolol- as patient has been transitioned to comfort care.  Acute respiratory failure with hypoxia/dyspnea -Chest x-ray with pulmonary edema and/or infectious process.  -Positive multifactorial including CHF, possible pneumonia, recent NSTEMI  Acute diastolic heart failure -Echocardiogram showed EF of 55-60% with grade 2 diastolic dysfunction -Was not given Lasix initially due to her kidney failure -Cardiology started IV Lasix 09/28/2016, with very little urine output -Patient has not had good urine output since admission  Diabetes mellitus, type II, uncontrolled -Hemoglobin A1c 8.6 -Metformin held -Patient has been noted to be noncompliant with medications as well as diet -discontinued lantus and ISS, CBG monitoring.   Acute on chronic kidney disease, stage III -Despite holding diuretics, patient's creatininecontinues to worsen, currently 4.4 -Upon reviewing records, patient was stage IV in August 2017, creatinine was 1.71. She improved mildly to 1.46. Back in 2002 12 and 2013, creatinine  was normal. -Patient has had poor urine output -Lasix ordered by cardiology -Patient unlikely to be a good candidate for dialysis if needed -Have consulted palliative care -Have spoken  to both granddaughters at bedside today, do not feel that patient would want dialysis. Offered to have nephrology consulted, consultation declined at this time is a graded patient is likely not a good dialysis candidate  Electrolyte abnormalities, hyperkalemia, hyponatremia -Likely secondary to combination of kidney disease as well as diabetes -Hyperkalemia resolved -Continue to monitor BMP  Essential Hypertension -Continue metoprolol, Lasix  Chronic anemia -Patient close to baseline hemoglobin, currently 7.9 -No evidence of active bleeding  Goals of care -Given patient's comorbidities and current situation, including recent NSTEMI, CVA, worsening renal function, respiratory status, feel that patient's overall prognosis is poor. Have discussed this with the granddaughters at bedside. Have urged granddaughters to speak to patient's sons and daughters regarding her CODE STATUS and possible quality of life, goals of care. -Palliative care consulted appreciated.  -Patient now DNR/DNI.  [facility] would [facility] however, no bed available. SW [facility].  -Continue morphine drip for comfort   Time: 10 minutes  Signed:  Edsel PetrinMIKHAIL, Gwendolyn Wallace  [facility] 10/02/2016, 2:12 PM

## 2016-10-10 NOTE — Care Management Important Message (Signed)
Important Message  Patient Details  Name: Gwendolyn HeysBessie M Mcmaster MRN: [id number] Date of Birth: 09/25/1936   Medicare Important Message Given:  Yes    Leldon Steege Stefan ChurchBratton 2016/11/19, 3:56 PM

## 2016-10-10 NOTE — Consult Note (Signed)
   [facility] Consult   09/28/2016  Blair HeysBessie M Stieg 1936-09-07 [id number]   Patient was screened for re-admission as a [employer] Medicare plan and chart reveals the family has chosen to pursue residential hospice placement. Patient and family currently awaiting placement. Given this choice the patient will have full case management services through Hospice and no community [facility] Care Management will be needed.  For questions, please contact:  Charlesetta ShanksVictoria Cammy Sanjurjo, RN BSN CCM [facility] Liaison  [phone] business mobile phone Toll free office [phone]

## 2016-10-10 NOTE — Progress Notes (Signed)
Order placed to transfer pt to 6N.  Pt's family made aware.  Report called and pt moved to 6N room 20.

## 2016-10-10 NOTE — Progress Notes (Signed)
Pt expired at 2210, pronounced by 2 RNs. Death was expected and pt was comfort care and a DNR. Death certificate completed and given to Blairselso, Charity fundraiserN.  KJKG, NP [facility]

## 2016-10-10 NOTE — Progress Notes (Signed)
Patient expired around 2210 with A. Brickhouse, RN confirming while family members at bedside.  On-call [facility] made aware for death certificate issuance.  Wasted 180 ml of morphine 250 mg in NS in sink with A. Brickhouse witnessing.  Family members coming and grieving.

## 2016-10-10 NOTE — Progress Notes (Signed)
Palliative Medicine RN Note: Rec'd call from RN. No beds available at [facility] today; family is requesting move to 6N. Called Dr Catha GosselinMikhail; she agreed to transfer pt to 6N. She also gave order for robinul for secretions.   Dr Catha GosselinMikhail is concerned pt will not survive long enough to get a hospice bed. Plan f/u tomorrow for d/c plan and symptoms.  Margret ChanceMelanie G. Alika Eppes, RN, BSN, [facility] 02/10/16 1:20 PM Cell [phone] 8:00-4:00 Monday-Friday Office [phone]

## 2016-10-10 NOTE — Clinical Social Work Note (Signed)
Per Toniann FailWendy with [facility], they have no beds available today. CSW paged MD.  Charlynn CourtSarah Markeese Boyajian, CSW [phone]

## 2016-10-10 NOTE — Progress Notes (Signed)
Palliative Medicine RN Note: Discussed pt in team rounds. Followed up on hospice referral; no note that contact was made with hospice. [facility] (NP over weekend offered choice, and this is first choice d/t location).   PMT RN will fax referral info asap to [phone].  Gwendolyn ChanceMelanie G. Charls Custer, RN, BSN, [facility] 10/02/2016 9:54 AM Cell [phone] 8:00-4:00 Monday-Friday Office [phone]

## 2016-10-10 NOTE — Clinical Social Work Note (Signed)
Clinical Social Work Assessment  Patient Details  Name: Gwendolyn Wallace MRN: [id number] Date of Birth: 08/10/1936  Date of referral:  09/29/16               Reason for consult:  Facility Placement, Discharge Planning                Permission sought to share information with:  Facility Sport and exercise psychologist, Family Supports Permission granted to share information::  Yes, Verbal Permission Granted  Name::     Advertising copywriter::  [facility]  Relationship::  Daughter  Contact Information:  [phone]  Housing/Transportation Living arrangements for the past 2 months:  [place] of Information:  Medical Team, Adult Children Patient Interpreter Needed:  None Criminal Activity/Legal Involvement Pertinent to Current Situation/Hospitalization:  No - Comment as needed Significant Relationships:  Adult Children, Other Family Members Lives with:  Adult Children Do you feel safe going back to the place where you live?  Yes Need for family participation in patient care:  Yes (Comment)  Care giving concerns:  Per palliative, prognosis is <2 weeks. Patient in need of residential hospice services.   Social Worker assessment / plan:  CSW met with patient. She was not awake but has been only oriented to person. Patient's two daughters and granddaughter at bedside. CSW introduced role and explained that residential hospice would be discussed. Patient's daughters confirmed first preference for [facility]. Second choice is [facility]. CSW called [facility] but they said the referral had already been made. They are currently full and there is someone on the list in front of this patient but asked that CSW call daily to check on bed availability. CSW notified patient's daughters. CSW made referral to [facility]. No further concerns. CSW encouraged patient's children to contact CSW as needed. CSW will continue to follow patient  and her family for support and facilitate discharge to residential hospice once bed available.  Employment status:  Retired Forensic scientist:  Other (Comment Required) ([employer]) PT Recommendations:  Not assessed at this time Information / Referral to community resources:  Other (Comment Required) (Residential Hospice)  Patient/Family's Response to care:  Patient not awake or fully oriented. Patient's daughters agreeable to residential hospice. Patient's family supportive and involved in patient's care. Patient's family appreciated social work intervention.  Patient/Family's Understanding of and Emotional Response to Diagnosis, Current Treatment, and Prognosis:  Patient not awake or fully oriented. Patient's daughters understand the need for residential hospice at this time. Patient's family appear happy with hospital care.  Emotional Assessment Appearance:  Appears stated age Attitude/Demeanor/Rapport:  Unable to Assess Affect (typically observed):  Unable to Assess Orientation:  Oriented to Self Alcohol / Substance use:  Tobacco Use Psych involvement (Current and /or in the community):  No (Comment)  Discharge Needs  Concerns to be addressed:  Care Coordination Readmission within the last 30 days:  Yes Current discharge risk:  Terminally ill Barriers to Discharge:  Hospice Bed not available   Candie Chroman, LCSW October 18, 2016, 10:50 AM

## 2016-10-10 NOTE — Progress Notes (Signed)
[facility] Liaison RN note  Received request from CSW for [facility]. Chart currently under review for eligibility.  There [facility].  This was communicated to Charlynn CourtSarah Boswell, CSW.    [facility] Liaison will follow up again tomorrow to determine availability.      Kristine GarbeWendy Slingerland, RN, BSN [facility] Liaison [phone]

## 2016-10-10 NOTE — Progress Notes (Signed)
Nutrition Brief Note  Patient identified due to Low Braden Score.   Wt Readings from Last 15 Encounters:  September 10, 2016 114 lb 1.6 oz (51.8 kg)  09/26/16 100 lb 5 oz (45.5 kg)  07/13/16 100 lb (45.4 kg)  04/02/13 104 lb 14.4 oz (47.6 kg)  11/27/12 107 lb 6 oz (48.7 kg)  08/26/12 107 lb 3.2 oz (48.6 kg)  04/17/12 106 lb (48.1 kg)  03/13/12 105 lb 3.2 oz (47.7 kg)  10/11/11 106 lb (48.1 kg)  07/03/11 110 lb 8 oz (50.1 kg)  04/26/11 107 lb 6.4 oz (48.7 kg)  02/20/11 108 lb (49 kg)  01/23/11 107 lb (48.5 kg)  12/12/10 106 lb (48.1 kg)  10/17/10 109 lb (49.4 kg)    Body mass index is 22.28 kg/m. Patient meets criteria for Normal Weight based on current BMI.   Palliative care consulted, patient transitioned to comfort care. [facility]  No nutrition interventions warranted at this time. If nutrition issues arise, please consult RD.   Dorothea Ogleeanne Reyden Smith RD, CSP, LDN Inpatient Clinical Dietitian Pager: [phone] After Hours Pager: [phone]

## 2016-10-10 DEATH — deceased

## 2017-12-19 IMAGING — DX DG CHEST 2V
2 series · 2 of 2 positions shown · non-contrast
Comparison: December 31, 2008

CLINICAL DATA: Coughing and shortness of breath.

EXAM:
CHEST  2 VIEW

[chest pa]
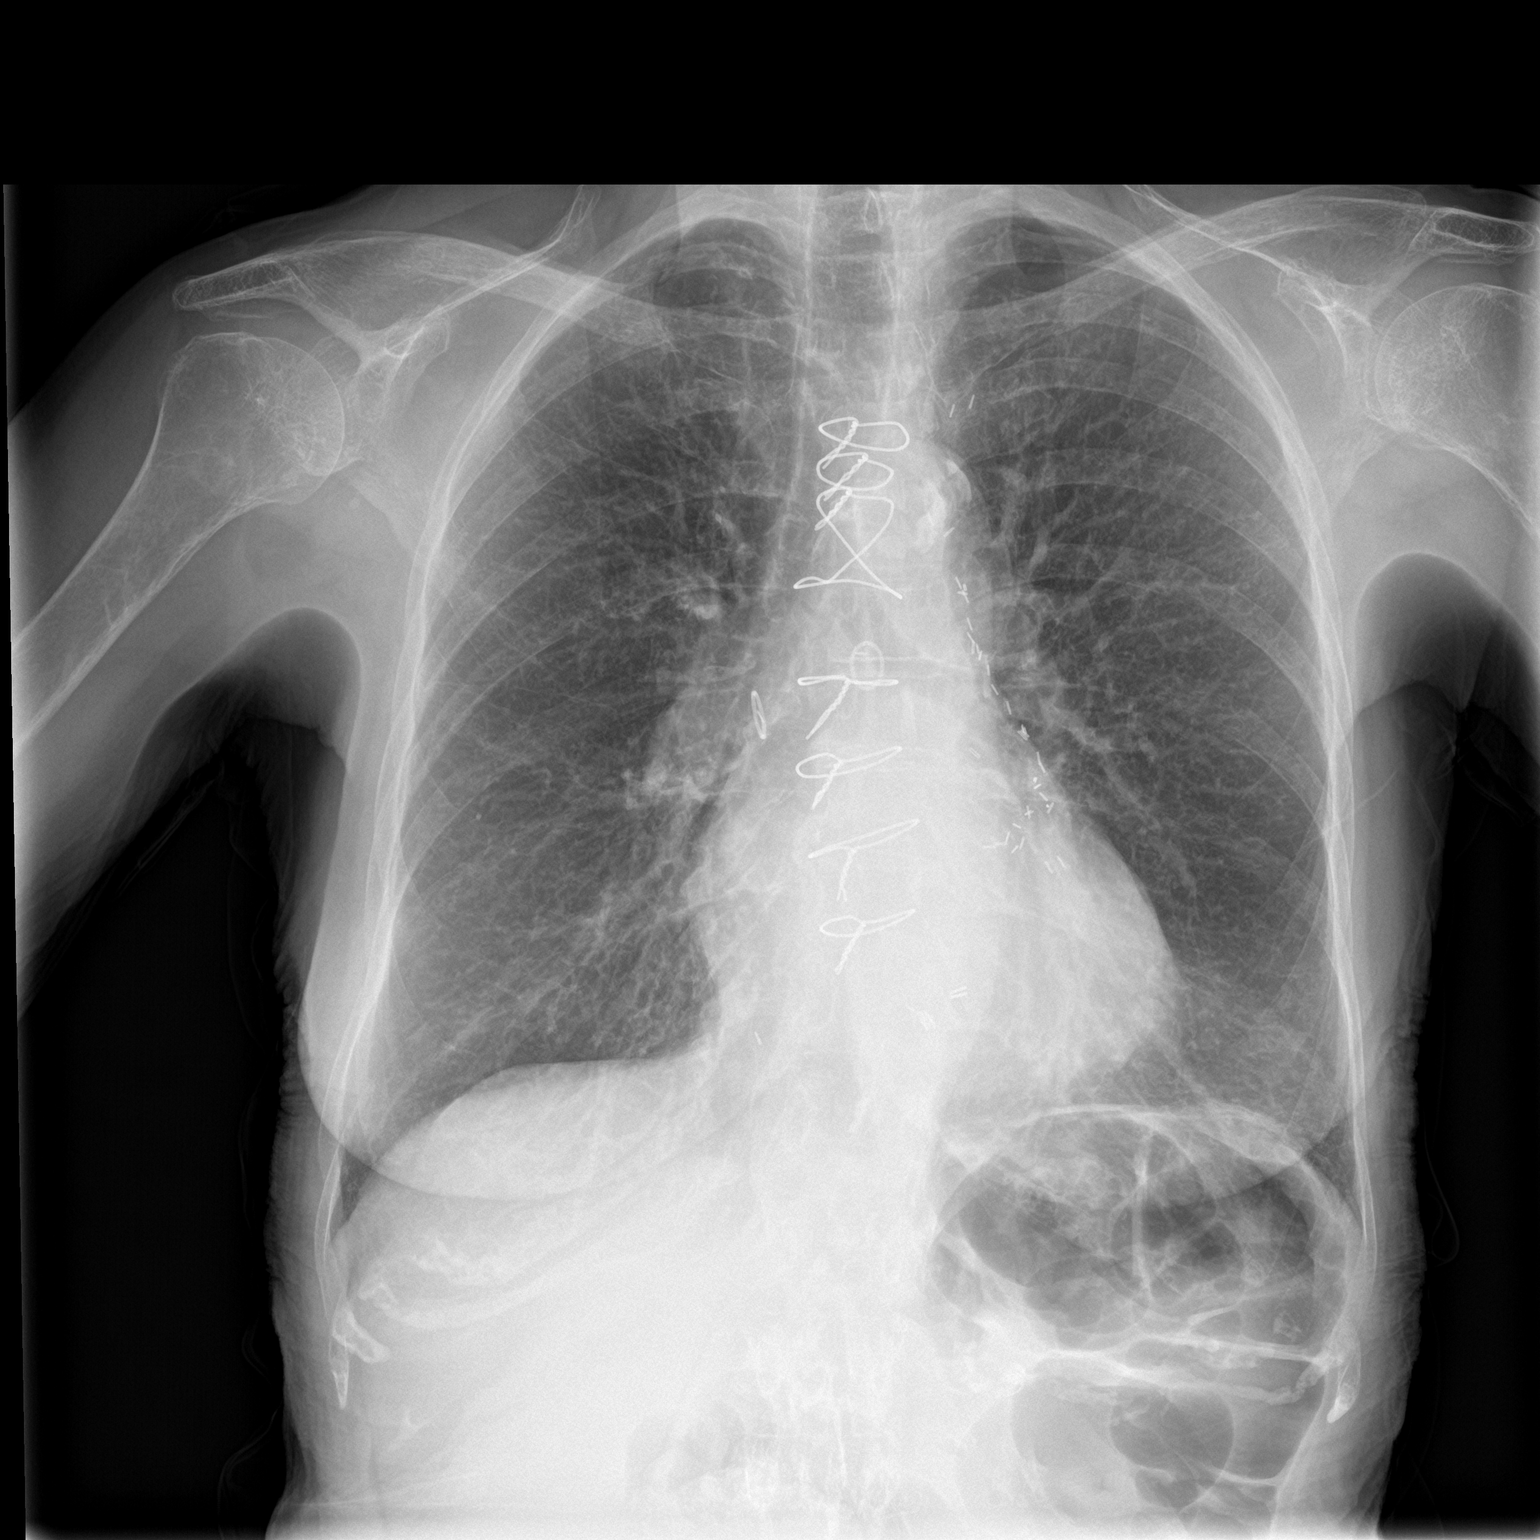

[chest lat]
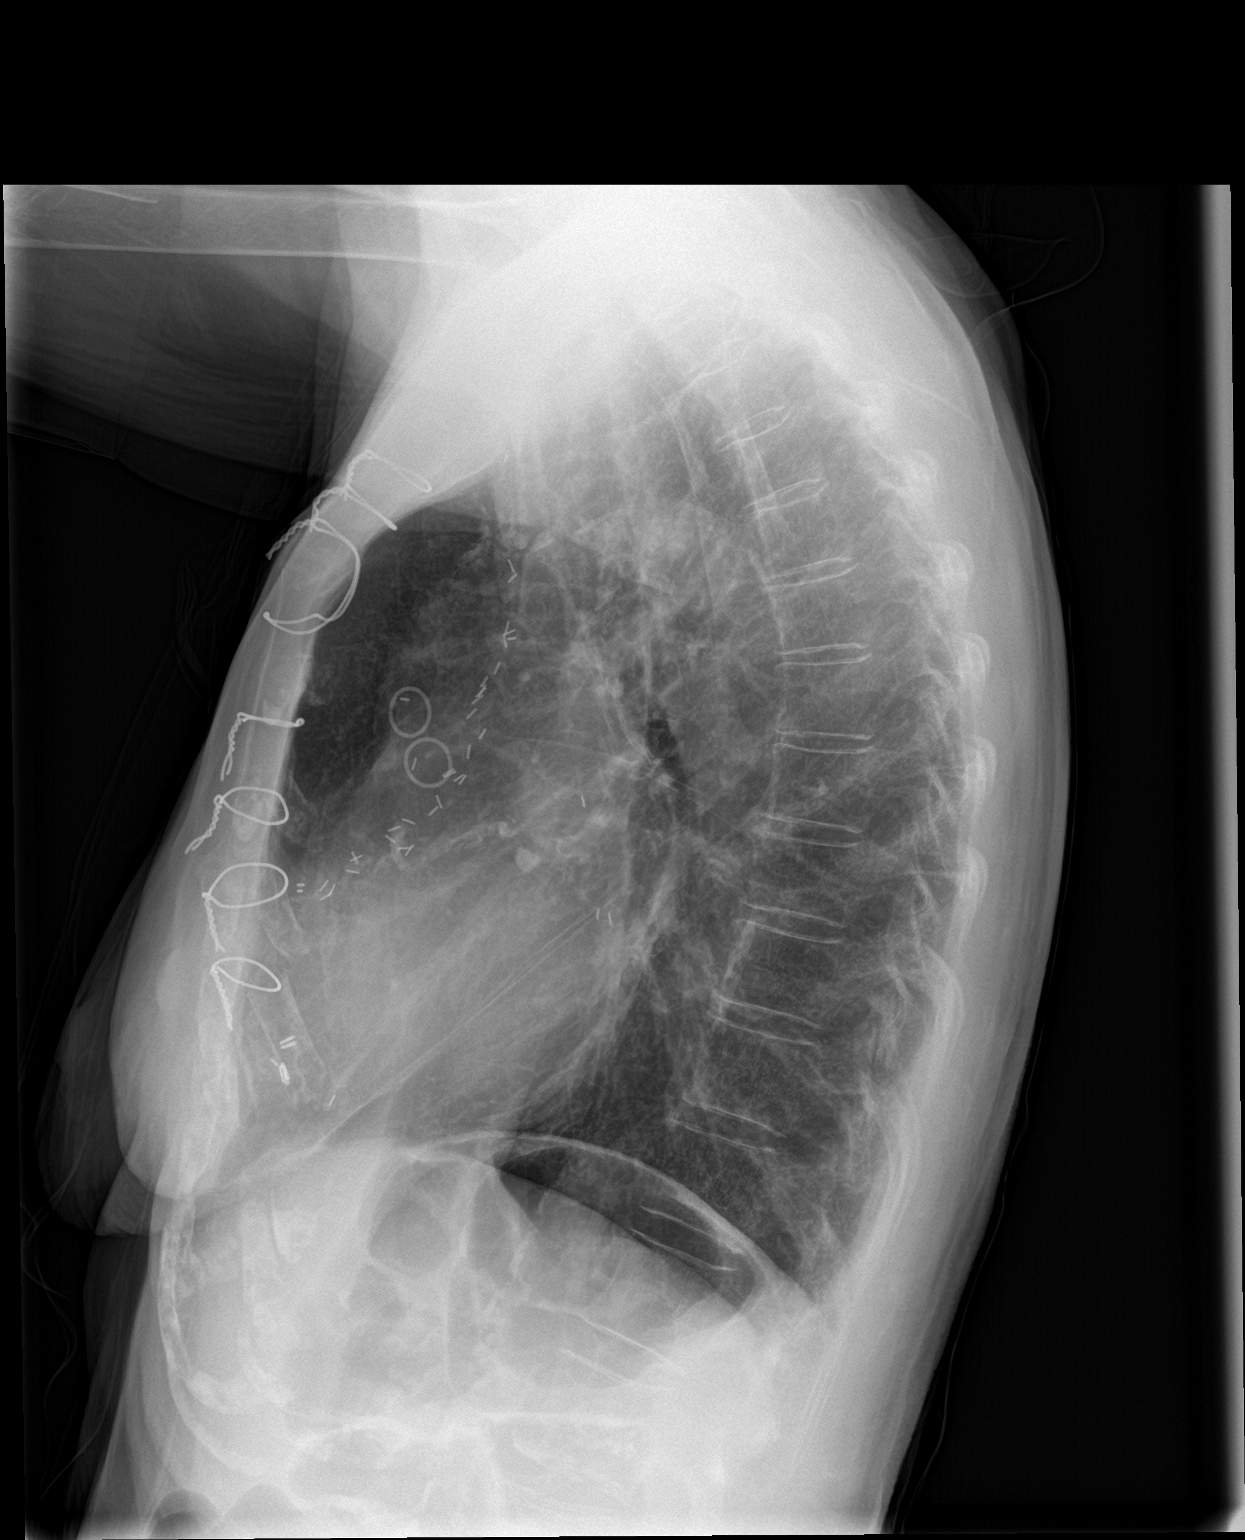

[2 of 2 positions shown; findings below may reference images not displayed]

FINDINGS: Post sternotomy changes. Mild haziness over the left lung base, not
significantly changed since December 2008, probably due to
overlapping soft tissues. No suspicious focal infiltrate is
identified. No nodules or masses. No other acute interval changes.
IMPRESSION: No active cardiopulmonary disease.
